# Patient Record
Sex: Male | Born: 1970 | Race: White | Hispanic: No | Marital: Married | Smoking: Never smoker
Health system: Southern US, Community
[De-identification: ages and names within clinical notes are randomized; demographics above are authoritative.]

## PROBLEM LIST (undated history)

## (undated) DIAGNOSIS — R569 Unspecified convulsions: Secondary | ICD-10-CM

## (undated) DIAGNOSIS — K219 Gastro-esophageal reflux disease without esophagitis: Secondary | ICD-10-CM

## (undated) DIAGNOSIS — E119 Type 2 diabetes mellitus without complications: Secondary | ICD-10-CM

## (undated) DIAGNOSIS — I8390 Asymptomatic varicose veins of unspecified lower extremity: Secondary | ICD-10-CM

## (undated) HISTORY — PX: VARICOSE VEIN SURGERY: SHX832

## (undated) HISTORY — PX: HERNIA REPAIR: SHX51

## (undated) HISTORY — DX: Asymptomatic varicose veins of unspecified lower extremity: I83.90

## (undated) HISTORY — PX: KNEE SURGERY: SHX244

---

## 2004-04-12 ENCOUNTER — Emergency Department (HOSPITAL_COMMUNITY): Admission: EM | Admit: 2004-04-12 | Discharge: 2004-04-12 | Payer: Self-pay | Admitting: Emergency Medicine

## 2009-10-28 ENCOUNTER — Ambulatory Visit (HOSPITAL_COMMUNITY): Admission: RE | Admit: 2009-10-28 | Discharge: 2009-10-28 | Payer: Self-pay | Admitting: Family Medicine

## 2009-10-28 ENCOUNTER — Ambulatory Visit: Payer: Self-pay | Admitting: Surgery

## 2009-10-28 ENCOUNTER — Encounter (INDEPENDENT_AMBULATORY_CARE_PROVIDER_SITE_OTHER): Payer: Self-pay | Admitting: Family Medicine

## 2009-11-19 ENCOUNTER — Ambulatory Visit: Payer: Self-pay | Admitting: Vascular Surgery

## 2009-11-20 ENCOUNTER — Emergency Department (HOSPITAL_BASED_OUTPATIENT_CLINIC_OR_DEPARTMENT_OTHER): Admission: EM | Admit: 2009-11-20 | Discharge: 2009-11-21 | Payer: Self-pay | Admitting: Emergency Medicine

## 2009-11-20 ENCOUNTER — Ambulatory Visit: Payer: Self-pay | Admitting: Diagnostic Radiology

## 2010-02-18 ENCOUNTER — Ambulatory Visit: Payer: Self-pay | Admitting: Vascular Surgery

## 2010-04-07 ENCOUNTER — Ambulatory Visit: Payer: Self-pay | Admitting: Vascular Surgery

## 2010-04-15 ENCOUNTER — Ambulatory Visit: Payer: Self-pay | Admitting: Vascular Surgery

## 2010-08-11 IMAGING — CT CT HEAD W/O CM
4 series · 18 of 30 positions shown, 19 images · non-contrast
Comparison: None available.

CLINICAL DATA: Syncope.

CT HEAD WITHOUT CONTRAST
TECHNIQUE: Contiguous axial images were obtained from the base of
the skull through the vertex without contrast.

[Series 2: head 4.8 h37s · axial · 0.48mm/px · z∈[+965,+1052]mm · 4 of 32 slices shown, 5 images (1 of 2)]
[im 7/32  brain]
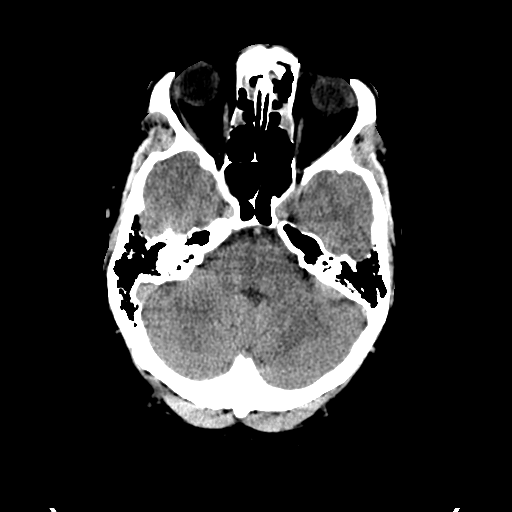
[im 7/32  bone]
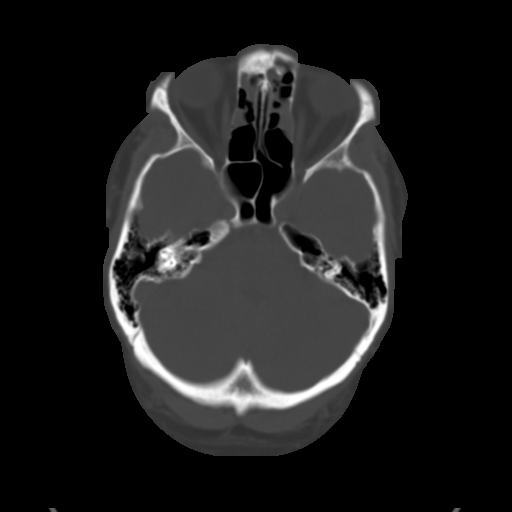
[im 13/32  brain]
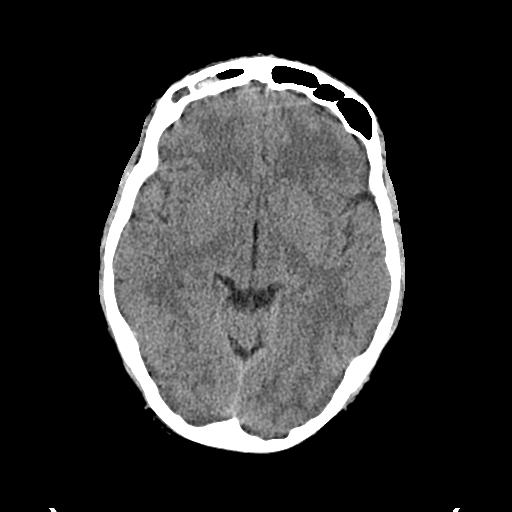
[im 19/32  brain]
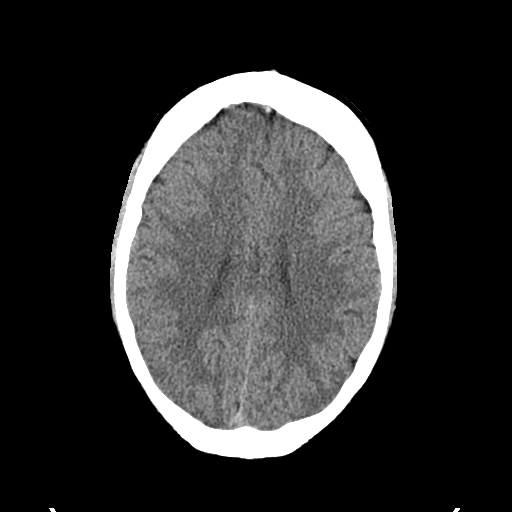
[im 25/32  brain]
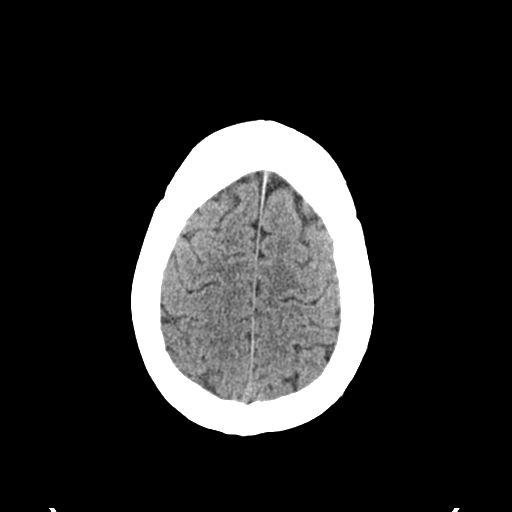

[Series 3: head 2.4 h60s bone · axial · 0.48mm/px · z∈[+947,+1073]mm · 8 of 64 slices shown (1 of 2)]
[im 6/64  bone]
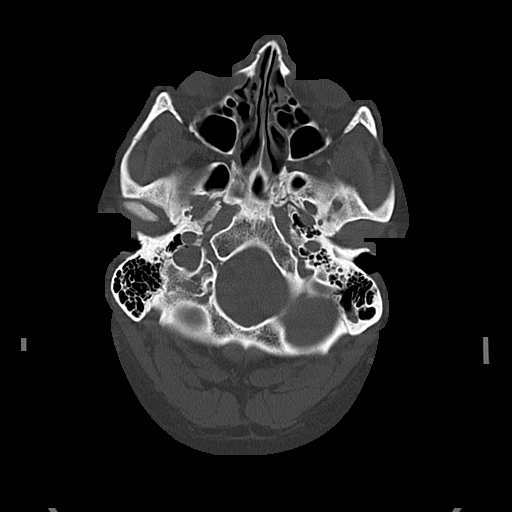
[im 12/64  bone]
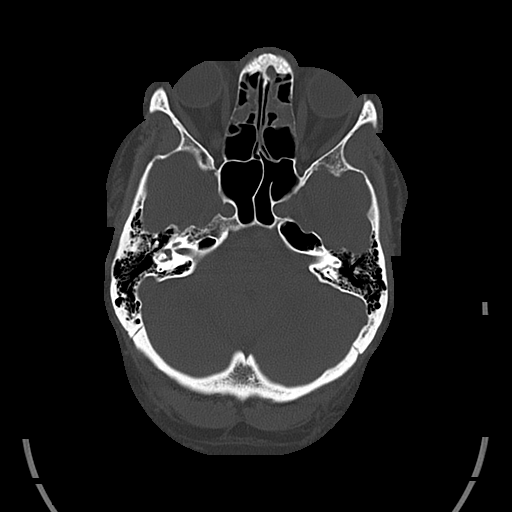
[im 23/64  bone]
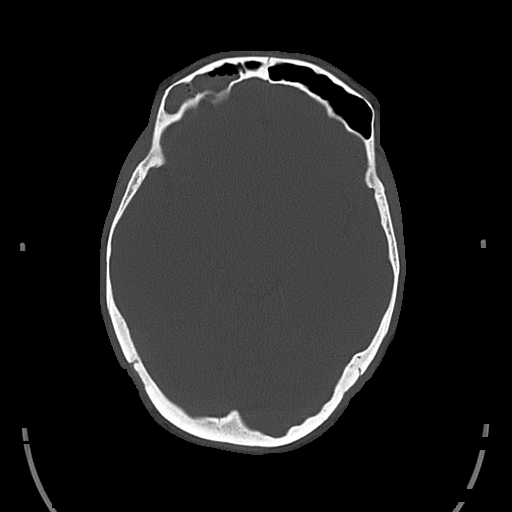
[im 29/64  bone]
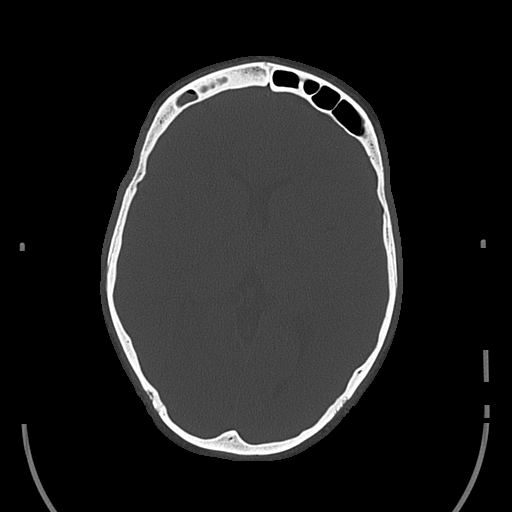
[im 35/64  bone]
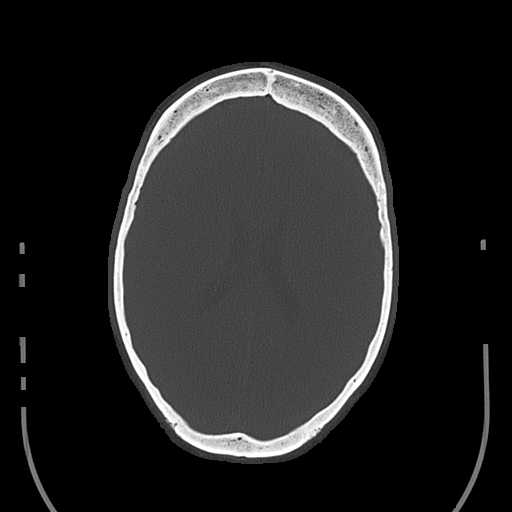
[im 41/64  bone]
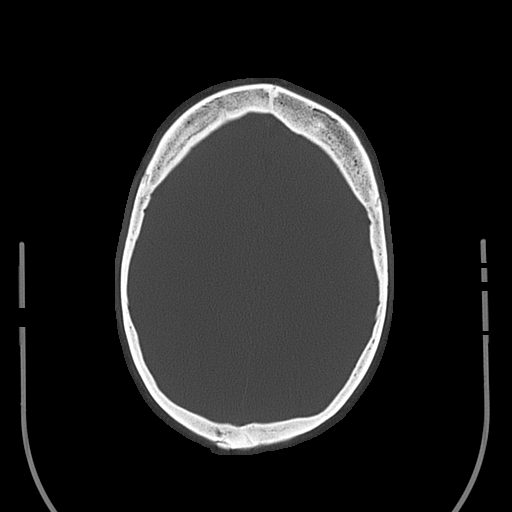
[im 52/64  bone]
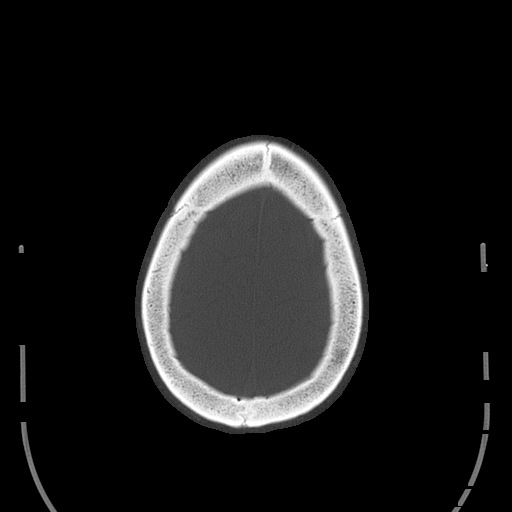
[im 58/64  bone]
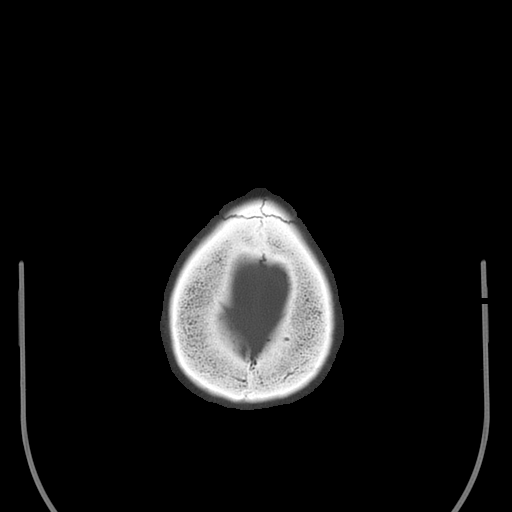

[Series 4: head 4.8 h37s · axial · 0.48mm/px · z∈[+965,+994]mm · 2 of 20 slices shown (2 of 2)]
[im 7/20  brain]
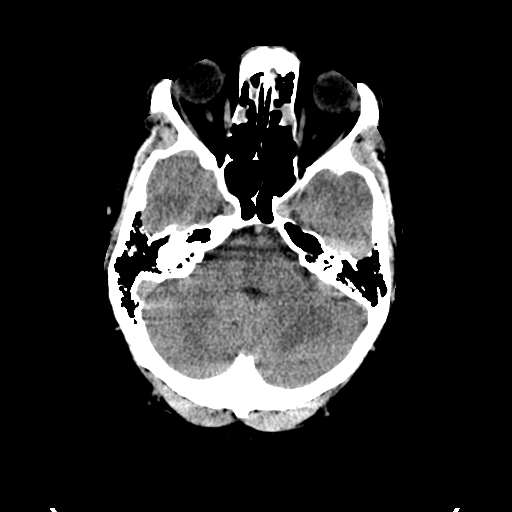
[im 13/20  brain]
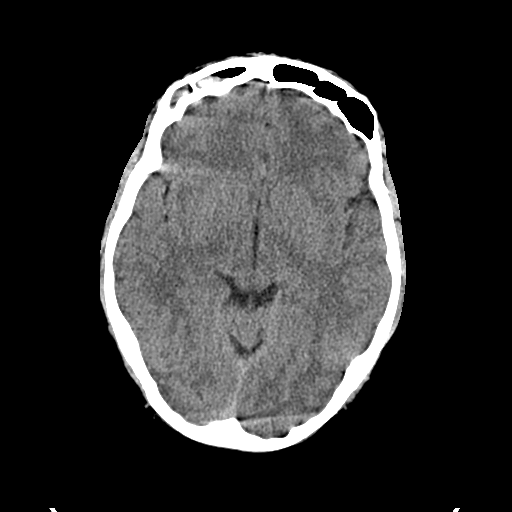

[Series 5: head 2.4 h60s bone · axial · 0.48mm/px · z∈[+947,+988]mm · 4 of 40 slices shown (2 of 2)]
[im 6/40  bone]
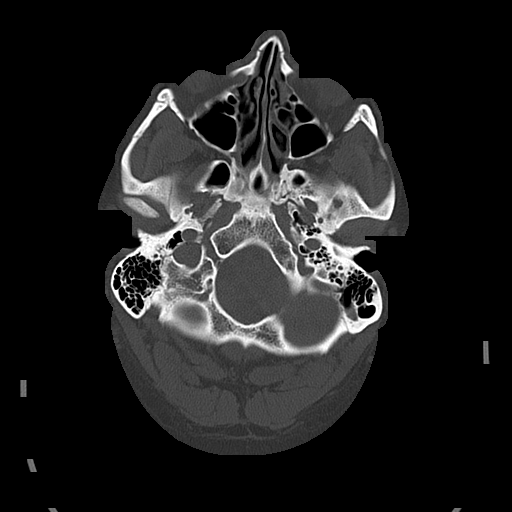
[im 12/40  bone]
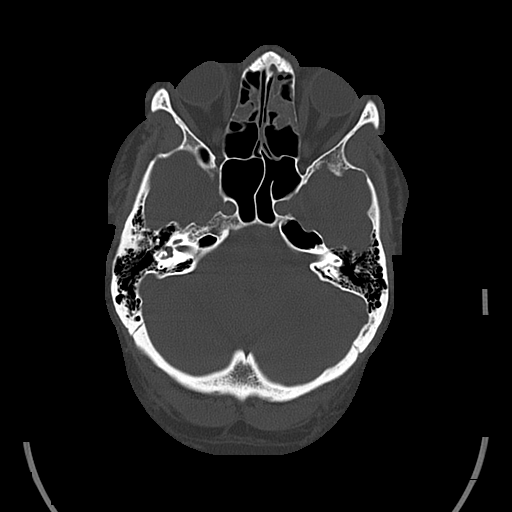
[im 17/40  bone]
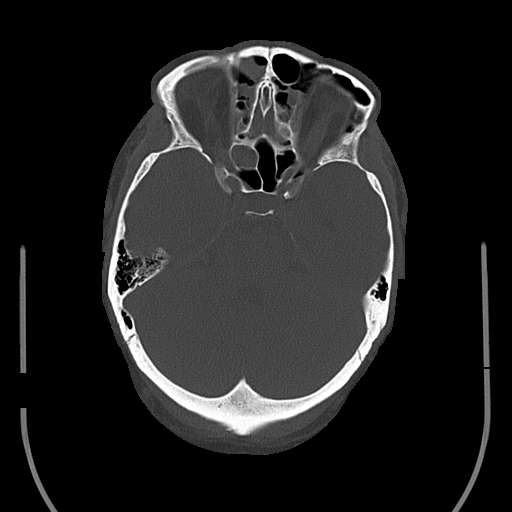
[im 23/40  bone]
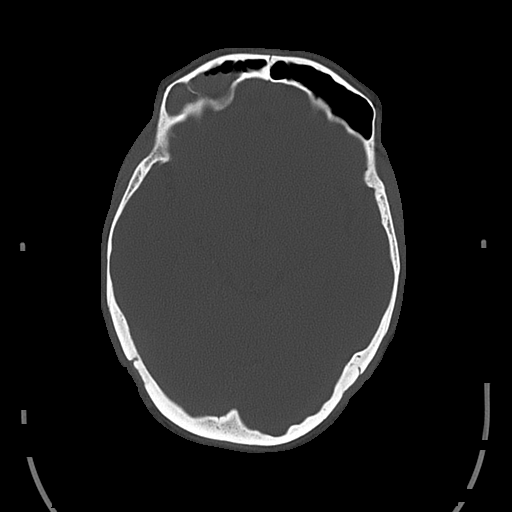

[18 of 30 positions shown; findings below may reference images not displayed]

FINDINGS: The brain appears normal evidence of acute infarction,
hemorrhage, mass lesion, mass effect, midline shift or abnormal
extra-axial fluid collection.  No hydrocephalus.  Cortical defect
is seen the anterior aspect of the frontal bone in the midline
consistent with incomplete fusion of the metopic suture.

The patient has fairly extensive sinus disease with bubbly
secretions seen in the right frontal sinus mucosal thickening left
frontal sinus.  Ethmoid air cell disease also noted.
IMPRESSION: 1.  No acute abnormality.
2.  Sinus disease which appears worst in the right frontal where
there are bubbly secretions compatible with acute disease.

## 2010-11-16 ENCOUNTER — Encounter: Payer: Self-pay | Admitting: Family Medicine

## 2011-01-12 LAB — CBC
Hemoglobin: 15 g/dL (ref 13.0–17.0)
MCHC: 34.7 g/dL (ref 30.0–36.0)
Platelets: 327 10*3/uL (ref 150–400)
RDW: 12.3 % (ref 11.5–15.5)

## 2011-01-12 LAB — BASIC METABOLIC PANEL
CO2: 28 mEq/L (ref 19–32)
Calcium: 8.7 mg/dL (ref 8.4–10.5)
Sodium: 138 mEq/L (ref 135–145)

## 2011-01-12 LAB — GLUCOSE, CAPILLARY: Glucose-Capillary: 194 mg/dL — ABNORMAL HIGH (ref 70–99)

## 2011-01-12 LAB — DIFFERENTIAL
Basophils Absolute: 0.2 10*3/uL — ABNORMAL HIGH (ref 0.0–0.1)
Lymphocytes Relative: 14 % (ref 12–46)
Lymphs Abs: 1.3 10*3/uL (ref 0.7–4.0)
Neutro Abs: 7 10*3/uL (ref 1.7–7.7)
Neutrophils Relative %: 78 % — ABNORMAL HIGH (ref 43–77)

## 2011-01-12 LAB — POCT CARDIAC MARKERS

## 2011-03-10 NOTE — Procedures (Signed)
LOWER EXTREMITY VENOUS REFLUX EXAM   INDICATION:  Right lower extremity varicose vein with pain and swelling.   EXAM:  Using color-flow imaging and pulse Doppler spectral analysis, the  right common femoral, superficial femoral, popliteal, posterior tibial,  greater and lesser saphenous veins are evaluated.  There is no evidence  suggesting deep venous insufficiency in the right lower extremity.   The right saphenofemoral junction is competent. The right GSV is not  competent with Reflux of >577milliseconds with the caliber as described  below.   The right proximal short saphenous vein demonstrates competency.   GSV Diameter (used if found to be incompetent only)                                            Right    Left  Proximal Greater Saphenous Vein           0.56 cm  cm  Proximal-to-mid-thigh                     0.56 cm  cm  Mid thigh                                 0.57 cm  cm  Mid-distal thigh                          0.57 cm  cm  Distal thigh                              0.57 cm  cm  Knee                                      0.52 cm  cm   IMPRESSION:  1. Right greater saphenous vein Reflux with >571milliseconds is      identified with the caliber ranging from 0.52 cm to 5.6 cm knee to      proximal thigh level.  2. The greater saphenous vein is not aneurysmal.  3. The right greater saphenous vein is not tortuous.  4. The deep venous system is competent.  5. The right lesser saphenous vein is competent.  6. No evidence of deep venous thrombosis noted in the right leg.  7. Thrombus noted in the varicose veins around the ankle area.   ___________________________________________  Quita Skye. Hart [place], M.D.   MG/MEDQ  D:  11/19/2009  T:  11/19/2009  Job:  [id number]

## 2011-03-10 NOTE — Procedures (Signed)
DUPLEX DEEP VENOUS EXAM - LOWER EXTREMITY   INDICATION:  One week varicose veins followup of right greater saphenous  vein.   HISTORY:  Edema:  yes  Trauma/Surgery:  Right greater saphenous vein ablation  Pain:  yes  PE:  no  Previous DVT:  No  Anticoagulants:  no  Other:   DUPLEX EXAM:                CFV   SFV   PopV  PTV    GSV                R  L  R  L  R  L  R   L  R  L  Thrombosis    0  0  0  0  0     +      +  Spontaneous   +  +  +     +     +      0  Phasic        +  +  +     +     +      0  Augmentation  +  +  +     +     +      0  Compressible  +  +  +     +     +      0  Competent     +  +  +     +     +      0   Legend:  + - yes  o - no  p - partial  D - decreased   IMPRESSION:  There does not appear to be any evidence of deep venous  thrombus noted in the right leg.  The right greater saphenous vein has  been abladed proximally to above the knee.  There is a patent lateral  branch off of the right saphenofemoral junction with no reflux noted.    _____________________________  Quita Skye Hart [place], M.D.   CB/MEDQ  D:  04/15/2010  T:  04/15/2010  Job:  [id number]

## 2011-03-10 NOTE — Assessment & Plan Note (Signed)
OFFICE VISIT   Joshua Conner, RIPP  DOB:  [birth year]                                       04/15/2010  ZOXWR#:[id number]   The patient returns 1 week post laser ablation of his right great  saphenous vein with greater than 20 stab phlebectomies for painful  varicosities secondary to venous reflux in the greater saphenous vein.  He has some mild to moderate discomfort in the proximal thigh, which is  resolving, and no change in the distal edema.  The stab phlebectomy  sites have not been painful.  He is wearing his stocking as instructed  and took the ibuprofen for 1 week.  Venous duplex exam today reveals  total closure of the right great saphenous vein with no reflux in the  anterolateral branch, which previously was noted to have reflux.  The  deep system has no DVT.  He is reassured regarding these findings.  He  will wear the stocking for 1 more week and then will return to see [place] on  a p.r.n. basis.     Quita Skye Hart Rochester, M.D.  Electronically Signed   JDL/MEDQ  D:  04/15/2010  T:  04/16/2010  Job:  [id number]

## 2011-03-10 NOTE — Consult Note (Signed)
NEW PATIENT CONSULTATION   Joshua Conner, Joshua Conner  DOB:  June 03, 1971                                       11/19/2009  ZOXWR#:[id number]   The patient is a healthy 40 year old male referred by Dr. Sigmund Hazel  for painful varicosities in the right leg and a recent episode of  thrombophlebitis.  The patient states that about 3 weeks ago he awoke  with pain and tenderness in the right pretibial area just above the  ankle with discoloration and tender to touch.  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  He has also noticed some  increased swelling in the right ankle as the day progressed over the  last few years.  He has had no history of bleeding, ulceration or other  complications.  A venous duplex exam was performed which revealed  superficial thrombophlebitis but no deep venous thrombosis.  He was  referred for further evaluation.  He has not worn elastic compression  stockings.  He occasionally elevates his legs with some improvement and  takes ibuprofen on a daily basis.   PREVIOUS CHRONIC MEDICAL PROBLEMS:  1. Hiatal hernia with previous esophageal dilatation.  2. 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  3. Status post umbilical hernia repair.  4. Negative for diabetes, hypertension, coronary artery disease, COPD      or stroke.   FAMILY HISTORY:  Positive for hypertension in a sister, stroke in a  grandfather.  Negative for diabetes or coronary artery disease.   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  He is on his feet all day.  He does not  use tobacco, drinks occasional alcohol.   REVIEW OF SYSTEMS:  Denies any chest pain, dyspnea on exertion, PND,  orthopnea.  Occasionally has swallowing problems as  noted with his  hiatal hernia.  No other abnormalities in the complete review of  systems.   REVIEW OF SYSTEMS:  Vital signs:  Blood pressure 143/93, heart rate 83,  respirations 20.  General:  He is a well-developed, well-nourished male  who is in no apparent distress.  He is alert and oriented x3.  HEENT:  Exam reveals EOMs intact, conjunctivae normal.  Lungs:  Clear to  auscultation.  Cardiovascular:  Regular rhythm.  No murmurs.  Abdomen:  Soft, nontender, no masses.  He has 3+ carotid pulses bilaterally with  no audible bruits.  Neurological:  Normal.  Musculoskeletal:  Exam  reveals no major deformities.  He has had previous knee surgery on the  right.  Skin:  Is free of rashes.  He does have hyperpigmentation the  lower third of the right leg.  Extremity exam:  Reveals 3+ femoral,  popliteal and dorsalis pedis pulses bilaterally.  There is bulging  varicosities in the right leg on the greater saphenous system along the  medial distal thigh and the medial calf with hyperpigmentation the lower  third of the leg and prominent spider and reticular veins throughout the  right ankle area.  No active ulcers noted.  He has some resolving  thrombophlebitis in the pretibial area on the right.  I reviewed the information provided by Dr. Hyacinth Meeker, organized this.  Ordered a repeat venous duplex exam today which I interpreted.  He does  have gross reflux throughout the entire right great saphenous vein and a  lateral branch feeding these painful varicosities.  No evidence of deep  venous obstruction with normal deep system.   The patient needs laser ablation of his right great saphenous vein with  multiple stab phlebectomies because of a complication of  thrombophlebitis and painful varicosities.  Will treat him for 3 months  with long leg elastic compression stockings (20 mm - 30 mm gradient) as  well as daily elevation as much as his job will allow and ibuprofen.  He  will return at that  time.  If there has been no improvement I think we  should proceed with the above named procedures.     Quita Skye Hart Rochester, M.D.  Electronically Signed   JDL/MEDQ  D:  11/19/2009  T:  11/20/2009  Job:  [id number]   cc:   Sigmund Hazel, M.D.

## 2011-03-10 NOTE — Assessment & Plan Note (Signed)
OFFICE VISIT   PHOENYX, PAULSEN  DOB:  09-22-1971                                       04/07/2010  YQIHK#:[id number]   The patient had laser ablation of his right great saphenous vein with  greater than 20 stab phlebectomies performed today for symptomatic  bulging varicosities in the right thigh, calf and ankle secondary to  saphenous reflux.  He tolerated the procedure well.  He will return in  June 21 for follow-up venous duplex exam to confirm closure.     Quita Skye Hart Rochester, M.D.  Electronically Signed   JDL/MEDQ  D:  04/07/2010  T:  04/08/2010  Job:  [id number]

## 2011-03-10 NOTE — Assessment & Plan Note (Signed)
OFFICE VISIT   [name], Joshua Conner  DOB:  1971-06-13                                       02/18/2010  [id number]#:[id number]   The patient returns today for further followup regarding his painful  varicosities secondary to venous hypertension in the right leg.  He has  bulging varicosities in the right distal thigh, medial calf and ankle  area and has had a history of thrombophlebitis in the pretibial area  secondary to these varicosities.  He continues to have swelling and pain  in the right leg despite wearing long leg elastic compression stockings  (20 mm - 30 mm gradient), elevating the leg as much as possible, and  trying ibuprofen on a daily basis.  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  His symptoms and swelling become worse as  the day progresses.  It is affecting his daily living as well as his  job.   On exam today he continues to have these bulging varicosities in the  distal thigh, medial calf and ankle and pretibial region with  hyperpigmentation lower third of the right leg and 1+ edema.  He has 3+  dorsalis pedis pulse palpable.   I think we should proceed with laser ablation of the right great  saphenous vein with multiple stab phlebectomies to be performed as a  single procedure.  We will proceed with precertification to perform them  in the near future.     Quita Skye Hart Rochester, M.D.  Electronically Signed   JDL/MEDQ  D:  02/18/2010  T:  02/19/2010  Job:  [id number]

## 2012-10-15 ENCOUNTER — Encounter (HOSPITAL_BASED_OUTPATIENT_CLINIC_OR_DEPARTMENT_OTHER): Payer: Self-pay | Admitting: *Deleted

## 2012-10-15 ENCOUNTER — Emergency Department (HOSPITAL_BASED_OUTPATIENT_CLINIC_OR_DEPARTMENT_OTHER)
Admission: EM | Admit: 2012-10-15 | Discharge: 2012-10-16 | Disposition: A | Discharge status: Home / Self Care | Payer: 59 | Attending: Emergency Medicine | Admitting: Emergency Medicine

## 2012-10-15 DIAGNOSIS — Y92838 Other recreation area as the place of occurrence of the external cause: Secondary | ICD-10-CM | POA: Insufficient documentation

## 2012-10-15 DIAGNOSIS — Y939 Activity, unspecified: Secondary | ICD-10-CM | POA: Insufficient documentation

## 2012-10-15 DIAGNOSIS — R569 Unspecified convulsions: Secondary | ICD-10-CM | POA: Insufficient documentation

## 2012-10-15 DIAGNOSIS — Z79899 Other long term (current) drug therapy: Secondary | ICD-10-CM | POA: Insufficient documentation

## 2012-10-15 DIAGNOSIS — T161XXA Foreign body in right ear, initial encounter: Secondary | ICD-10-CM

## 2012-10-15 DIAGNOSIS — T169XXA Foreign body in ear, unspecified ear, initial encounter: Secondary | ICD-10-CM | POA: Insufficient documentation

## 2012-10-15 DIAGNOSIS — Y9239 Other specified sports and athletic area as the place of occurrence of the external cause: Secondary | ICD-10-CM | POA: Insufficient documentation

## 2012-10-15 DIAGNOSIS — X58XXXA Exposure to other specified factors, initial encounter: Secondary | ICD-10-CM | POA: Insufficient documentation

## 2012-10-15 HISTORY — DX: Unspecified convulsions: R56.9

## 2012-10-15 MED ORDER — LIDOCAINE HCL 2 % IJ SOLN
INTRAMUSCULAR | Status: AC
  Filled 2012-10-15: qty 20

## 2012-10-15 NOTE — ED Provider Notes (Signed)
History   This chart was scribed for Richardean Canal, MD scribed by Magnus Sinning. The patient was seen in room MHT13/MHT13 at 22:20   CSN: [id number]  Arrival date & time 10/15/12  2207   First MD Initiated Contact with Patient 10/15/12 2219      Chief Complaint  Patient presents with  . Foreign Body in Ear    (Consider location/radiation/quality/duration/timing/severity/associated sxs/prior treatment) HPI Joshua Conner is a 41 y.o. male who presents to the Emergency Department complaining of a insect in his right ear, onset this evening. He says he was on a baseball field standing when he felt an insect fly and crawl into his ear. He says he was previously able to hear the buzzing, but notes he can now only feel it moving.  The patient notes that he takes Lamictal for treatment of seizures and nexium.  Past Medical History  Diagnosis Date  . Seizures     Past Surgical History  Procedure Date  . Hernia repair   . Knee surgery     History reviewed. No pertinent family history.  History  Substance Use Topics  . Smoking status: Never Smoker   . Smokeless tobacco: Not on file  . Alcohol Use: Yes     Review of Systems  All other systems reviewed and are negative.    Allergies  Review of patient's allergies indicates no known allergies.  Home Medications   Current Outpatient Rx  Name  Route  Sig  Dispense  Refill  . ESOMEPRAZOLE MAGNESIUM 40 MG PO CPDR   Oral   Take 40 mg by mouth daily before breakfast.         . LAMOTRIGINE 200 MG PO TABS   Oral   Take 200 mg by mouth daily.           BP 144/99  Pulse 92  Temp 97.6 F (36.4 C) (Oral)  Resp 23  SpO2 100%  Physical Exam  Nursing note and vitals reviewed. Constitutional: He is oriented to person, place, and time. He appears well-developed and well-nourished. No distress.  HENT:  Head: Normocephalic and atraumatic.  Right Ear: Tympanic membrane normal.  Left Ear: Tympanic membrane normal.   No bugs or foreign bodies visualized, but portion of the canal unable to be visualized. Right and left TM's are nml.  Eyes: Conjunctivae normal and EOM are normal.  Neck: Neck supple. No tracheal deviation present.  Cardiovascular: Normal rate.   Pulmonary/Chest: Effort normal. No respiratory distress.  Abdominal: He exhibits no distension.  Musculoskeletal: Normal range of motion.  Neurological: He is alert and oriented to person, place, and time. No sensory deficit.  Skin: Skin is dry.  Psychiatric: He has a normal mood and affect. His behavior is normal.    ED Course  Procedures (including critical care time) DIAGNOSTIC STUDIES: Oxygen Saturation is 100% on room air, normal by my interpretation.    I irrigated the R ear with 1% lidocaine. Patient remained on his side for 5 minutes with the lidocaine in the ear. No foreign body visualized. No complications.   COORDINATION OF CARE: 22:21: Physical exam performed. Labs Reviewed - No data to display No results found.   1. Foreign body of ear, right       MDM  Joshua Conner is a 41 y.o. male here with possible R foreign body in the ear that is not present currently. I think the bug may have gotten out. I recommend that he follows up with ENT  if he still feels the bug crawling.   I personally performed the services described in this documentation, which was scribed in my presence. The recorded information has been reviewed and is accurate.       Richardean Canal, MD 10/15/12 [phone]

## 2012-10-15 NOTE — ED Notes (Signed)
Pt states he has a bug in his right ear

## 2014-03-11 ENCOUNTER — Emergency Department (HOSPITAL_BASED_OUTPATIENT_CLINIC_OR_DEPARTMENT_OTHER): Payer: 59

## 2014-03-11 ENCOUNTER — Encounter (HOSPITAL_BASED_OUTPATIENT_CLINIC_OR_DEPARTMENT_OTHER): Payer: Self-pay | Admitting: Emergency Medicine

## 2014-03-11 ENCOUNTER — Emergency Department (HOSPITAL_BASED_OUTPATIENT_CLINIC_OR_DEPARTMENT_OTHER)
Admission: EM | Admit: 2014-03-11 | Discharge: 2014-03-12 | Disposition: A | Discharge status: Home / Self Care | Payer: 59 | Attending: Emergency Medicine | Admitting: Emergency Medicine

## 2014-03-11 DIAGNOSIS — J90 Pleural effusion, not elsewhere classified: Secondary | ICD-10-CM | POA: Insufficient documentation

## 2014-03-11 DIAGNOSIS — K219 Gastro-esophageal reflux disease without esophagitis: Secondary | ICD-10-CM | POA: Insufficient documentation

## 2014-03-11 DIAGNOSIS — G40909 Epilepsy, unspecified, not intractable, without status epilepticus: Secondary | ICD-10-CM | POA: Insufficient documentation

## 2014-03-11 DIAGNOSIS — R091 Pleurisy: Secondary | ICD-10-CM

## 2014-03-11 DIAGNOSIS — F411 Generalized anxiety disorder: Secondary | ICD-10-CM | POA: Insufficient documentation

## 2014-03-11 HISTORY — DX: Gastro-esophageal reflux disease without esophagitis: K21.9

## 2014-03-11 LAB — CBC
HCT: 40.8 % (ref 39.0–52.0)
Hemoglobin: 14.6 g/dL (ref 13.0–17.0)
MCH: 31.9 pg (ref 26.0–34.0)
MCHC: 35.8 g/dL (ref 30.0–36.0)
MCV: 89.1 fL (ref 78.0–100.0)
PLATELETS: 339 10*3/uL (ref 150–400)
RBC: 4.58 MIL/uL (ref 4.22–5.81)
RDW: 12.7 % (ref 11.5–15.5)
WBC: 8.7 10*3/uL (ref 4.0–10.5)

## 2014-03-11 LAB — BASIC METABOLIC PANEL
BUN: 22 mg/dL (ref 6–23)
CHLORIDE: 100 meq/L (ref 96–112)
CO2: 23 meq/L (ref 19–32)
Calcium: 9.1 mg/dL (ref 8.4–10.5)
Creatinine, Ser: 1.5 mg/dL — ABNORMAL HIGH (ref 0.50–1.35)
GFR, EST AFRICAN AMERICAN: 65 mL/min — AB (ref >90)
GFR, EST NON AFRICAN AMERICAN: 56 mL/min — AB (ref >90)
GLUCOSE: 104 mg/dL — AB (ref 70–99)
POTASSIUM: 3.8 meq/L (ref 3.7–5.3)
Sodium: 140 mEq/L (ref 137–147)

## 2014-03-11 LAB — TROPONIN I

## 2014-03-11 LAB — D-DIMER, QUANTITATIVE: D-Dimer, Quant: <0.27 ug/mL-FEU (ref 0.00–0.48)

## 2014-03-11 MED ORDER — ONDANSETRON HCL 4 MG/2ML IJ SOLN
4.0000 mg | Freq: Once | INTRAMUSCULAR | Status: AC
  Administered 2014-03-11: 4 mg via INTRAVENOUS
  Filled 2014-03-11: qty 2

## 2014-03-11 MED ORDER — LORAZEPAM 2 MG/ML IJ SOLN
1.0000 mg | Freq: Once | INTRAMUSCULAR | Status: AC
  Administered 2014-03-11: 1 mg via INTRAVENOUS

## 2014-03-11 MED ORDER — LORAZEPAM 2 MG/ML IJ SOLN
INTRAMUSCULAR | Status: AC
  Filled 2014-03-11: qty 1

## 2014-03-11 MED ORDER — SODIUM CHLORIDE 0.9 % IV BOLUS (SEPSIS)
1000.0000 mL | Freq: Once | INTRAVENOUS | Status: AC
  Administered 2014-03-12: 1000 mL via INTRAVENOUS

## 2014-03-11 NOTE — ED Notes (Addendum)
Pt girlfriend states they were out for an hour long walk, then had a personal conversation. Pt has been working with someone for anxiety but no medication given. Pt states that 30 min ago his chest starting hurting. Pt states center chest no radiation with an increase when he takes a deep breath. Pt states that pressure on the site does increase pain at first but then relieves pain.

## 2014-03-11 NOTE — ED Provider Notes (Signed)
CSN: [id number]     Arrival date & time 03/11/14  2243 History  This chart was scribed for Joya Gaskinsonald W Pius Byrom, MD by Beverly MilchJ Harrison Collins, ED Scribe. This patient was seen in room MH11/MH11 and the patient's care was started at 11:12 PM.    Chief Complaint  Patient presents with  . Chest Pain  . Shortness of Breath    Patient is a 43 y.o. male presenting with chest pain and shortness of breath. The history is provided by the patient. No language interpreter was used.  Chest Pain Pain location:  L chest Pain quality: burning and stabbing   Pain radiates to:  Upper back Pain radiates to the back: yes   Pain severity:  Moderate Onset quality:  Sudden Duration:  1 hour Timing:  Constant Progression:  Unchanged Chronicity:  New Context: breathing   Associated symptoms: shortness of breath   Associated symptoms: no fever   Shortness of Breath Associated symptoms: chest pain   Associated symptoms: no fever      HPI Comments: Melina ModenaScott Opara is a 43 y.o. male who presents to the Emergency Department complaining of CP that began 30 minutes ago. He states he had been walking the dog leisurely when he suddenly felt the pain. Pt reports it hurts when he breathes. Pt denies h/o similar pain and DM. Pt denies smoking.  This started immediately after having a "personal conversation" with family.  He immediately had chest pain and was hyperventilating per family member  He has no h/o CAD/DVT/PE  Past Medical History  Diagnosis Date  . Seizures   . Acid reflux     Past Surgical History  Procedure Laterality Date  . Hernia repair    . Knee surgery      History reviewed. No pertinent family history. History  Substance Use Topics  . Smoking status: Never Smoker   . Smokeless tobacco: Not on file  . Alcohol Use: Yes    Review of Systems  Constitutional: Negative for fever.  Respiratory: Positive for shortness of breath.   Cardiovascular: Positive for chest pain.  Neurological:  Negative for syncope.  Psychiatric/Behavioral: The patient is nervous/anxious.   All other systems reviewed and are negative.     Allergies  Review of patient's allergies indicates no known allergies.  Home Medications   Prior to Admission medications   Medication Sig Start Date End Date Taking? Authorizing Provider  esomeprazole (NEXIUM) 40 MG capsule Take 40 mg by mouth daily before breakfast.   Yes Historical Provider, MD  lamoTRIgine (LAMICTAL) 200 MG tablet Take 200 mg by mouth daily.   Yes Historical Provider, MD    Triage Vitals: BP 144/84  Pulse 106  Temp(Src) 98.6 F (37 C) (Oral)  Resp 16  Ht 6\' 1"  (1.854 m)  Wt 245 lb (111.131 kg)  BMI 32.33 kg/m2  SpO2 100%   Physical Exam  Nursing note and vitals reviewed. CONSTITUTIONAL: Well developed/well nourished, anxious HEAD: Normocephalic/atraumatic EYES: EOMI/PERRL ENMT: Mucous membranes moist NECK: supple no meningeal signs SPINE:entire spine nontender CV: S1/S2 noted, no murmurs/rubs/gallops noted LUNGS: decreased breath sounds bilaterally due to poor effort ABDOMEN: soft, nontender, no rebound or guarding GU:no cva tenderness NEURO: Pt is awake/alert, moves all extremitiesx4, no focal weakness noted in any of his extremities EXTREMITIES: pulses normal, full ROM, no calf tenderness or edema SKIN: warm, color normal PSYCH: anxious, clutching his chest   ED Course  Procedures      EMERGENCY DEPARTMENT US CARDIAC EXAM "Study: Limited Ultrasound  of the heart and pericardium"  INDICATIONS:Dyspnea views of the heart and pericardium are obtained with a multi-frequency probe.  PERFORMED RU:EAVWUJBY:Myself  IMAGES ARCHIVED?: Yes  FINDINGS: No pericardial effusion  LIMITATIONS:  Body habitus  VIEWS USED: Parasternal long axis  INTERPRETATION: Pericardial effusioin absent   DIAGNOSTIC STUDIES: Oxygen Saturation is 100% on RA, normal by my interpretation.     COORDINATION OF CARE: 11:16 PM- Pt advised of  plan for treatment and pt agrees. Pt improved Pt has low HEART score.   Will obtain repeat ekg/troponin D-dimer negative, doubt PE or Dissection at this time   3:50 AM Pt monitored for several hours His EKG was unchanged Repeat troponin negative after 3 hrs D-dimer negative Possible pericarditis (no EKG changes) but did endorse sharp CP.  Bedside ultrasound did not reveal pericardial effusion  Pt had onset of pain immediately after stressful conversation and had CP and also hyperventilating. I feel part of this may be caused by anxiety, however pericarditis is possible.  He was started on NSAIDs.  Will also give short course of ativan for anxiety.  I advised need to see his PCP this week We discussed strict return precautions   Labs Review Labs Reviewed  BASIC METABOLIC PANEL - Abnormal; Notable for the following:    Glucose, Bld 104 (*)    Creatinine, Ser 1.50 (*)    GFR calc non Af Amer 56 (*)    GFR calc Af Amer 65 (*)    All other components within normal limits  CBC  TROPONIN I  D-DIMER, QUANTITATIVE  TROPONIN I  TROPONIN I    Imaging Review Dg Chest Port 1 View  03/11/2014   CLINICAL DATA:  CHEST PAIN SHORTNESS OF BREATH  EXAM: PORTABLE CHEST - 1 VIEW  COMPARISON:  None.  FINDINGS: Low lung volumes. The heart size and mediastinal contours are within normal limits. Both lungs are clear. The visualized skeletal structures are unremarkable.  IMPRESSION: No active disease.   Electronically Signed   By: Salome HolmesHector  Cooper M.D.   On: 03/11/2014 23:38      EKG Interpretation   Date/Time:  Sunday Mar 11 2014 22:51:28 EDT Ventricular Rate:  96 PR Interval:  162 QRS Duration: 110 QT Interval:  358 QTC Calculation: 452 R Axis:   -10 Text Interpretation:  Normal sinus rhythm Normal ECG Confirmed by Bebe ShaggyWICKLINE   MD, Dorinda HillNALD ([id number]) on 03/11/2014 11:01:46 PM      MDM   Final diagnoses:  Pleurisy    Nursing notes including past medical history and social history reviewed  and considered in documentation xrays reviewed and considered Labs/vital reviewed and considered   I personally performed the services described in this documentation, which was scribed in my presence. The recorded information has been reviewed and is accurate.      Joya Gaskinsonald W Kaedon Fanelli, MD 03/12/14 [phone]

## 2014-03-11 NOTE — ED Notes (Signed)
Wife reports 4 beers earlier

## 2014-03-12 ENCOUNTER — Other Ambulatory Visit: Payer: Self-pay

## 2014-03-12 LAB — TROPONIN I: Troponin I: <0.3 ng/mL (ref <0.30)

## 2014-03-12 MED ORDER — MORPHINE SULFATE 4 MG/ML IJ SOLN
4.0000 mg | Freq: Once | INTRAMUSCULAR | Status: AC
  Administered 2014-03-12: 4 mg via INTRAVENOUS
  Filled 2014-03-12: qty 1

## 2014-03-12 MED ORDER — IBUPROFEN 800 MG PO TABS
800.0000 mg | ORAL_TABLET | Freq: Once | ORAL | Status: AC
  Administered 2014-03-12: 800 mg via ORAL
  Filled 2014-03-12: qty 1

## 2014-03-12 MED ORDER — IBUPROFEN 600 MG PO TABS
600.0000 mg | ORAL_TABLET | Freq: Four times a day (QID) | ORAL | Status: DC | PRN
Start: 2014-03-12 — End: 2024-02-14

## 2014-03-12 MED ORDER — LORAZEPAM 1 MG PO TABS: 1.0000 mg | ORAL_TABLET | Freq: Two times a day (BID) | ORAL | Status: DC

## 2014-03-12 MED ORDER — LORAZEPAM 2 MG/ML IJ SOLN
1.0000 mg | Freq: Once | INTRAMUSCULAR | Status: AC
  Administered 2014-03-12: 1 mg via INTRAVENOUS
  Filled 2014-03-12: qty 1

## 2014-03-12 NOTE — ED Notes (Signed)
As I entered room,patient was still and almost asleep, I announced to him, that I needed to take a new set of vitals. I started BP cuff to inflate, patient aroused awake, then started moaning to show pain. As BP cuff tightened patient was louder and tossing back and forth. First BP of 138/120 was in question. After I finished vitals, patient went back to calm dimeaner. I hit BP cuff again to see if BP was lower while patient was not aware. As cuff became tighter, patient again acted out as if in pain. And BP showed 167/99 lower.

## 2014-03-12 NOTE — Discharge Instructions (Signed)
Pleurisy °Pleurisy is redness, puffiness (swelling), and soreness (inflammation) of the lining of the lungs. It can be hard to breathe and hurt to breathe. Coughing or deep breathing will make it hurt more. It is often caused by an existing infection or disease.  °HOME CARE °· Only take medicine as told by your doctor. °· Only take antibiotic medicine as directed. Make sure to finish it even if you start to feel better. °GET HELP RIGHT AWAY IF:  °· Your lips, fingernails, or toenails are blue or dark. °· You cough up blood. °· You have a hard time breathing. °· Your pain is not controlled with medicine or it lasts for more than 1 week. °· Your pain spreads (radiates) into your neck, arms, or jaw. °· You are short of breath or wheezing. °· You develop a fever, rash, throw up (vomit), or faint. °MAKE SURE YOU:  °· Understand these instructions. °· Will watch your condition. °· Will get help right away if you are not doing well or get worse. °Document Released: 09/24/2008 Document Revised: 06/14/2013 Document Reviewed: 03/26/2013 °[employer]® Patient Information ©2014 [employer]. ° °

## 2014-04-13 ENCOUNTER — Telehealth: Payer: Self-pay

## 2014-04-13 DIAGNOSIS — I83893 Varicose veins of bilateral lower extremities with other complications: Secondary | ICD-10-CM

## 2014-04-13 NOTE — Telephone Encounter (Signed)
Phone call from pt. Reported a bulging vein in right inner thigh, approx. 1/2 way between the knee and the groin.  Stated the area is warm and tender; noticed it 2 days ago.  Denied any redness.  Reported he usually has some degree of swelling in the right lower leg from foot to knee, due to a "hx of a lot of trauma with previous surgeries and fractures"; denied any increase in the swelling.  Also reports he has several varicose veins in the right lower leg.  Denied any problem with the left leg.  Advised to wear compression stocking on right leg, elevate, and apply ice to the tender vein. (pt. stated he is unable to take Ibuprofen, due to kidney disease.) appt. given for 05/01/14 @ 12:30 PM.  Question if safe to fly?  Advised if his flight is short, and if he wears compression stockings, he will be safe to fly.  Stated [place] to [place]; approx. 1 1/2 hr in length.  Advised to report worsening symptoms, if occurs prior to appt. 05/01/14. Verb. Understanding.

## 2014-04-30 ENCOUNTER — Encounter: Payer: Self-pay | Admitting: Vascular Surgery

## 2014-05-01 ENCOUNTER — Encounter: Payer: Self-pay | Admitting: Vascular Surgery

## 2014-05-01 ENCOUNTER — Ambulatory Visit (INDEPENDENT_AMBULATORY_CARE_PROVIDER_SITE_OTHER): Payer: 59 | Admitting: Vascular Surgery

## 2014-05-01 ENCOUNTER — Ambulatory Visit (HOSPITAL_COMMUNITY)
Admission: RE | Admit: 2014-05-01 | Discharge: 2014-05-01 | Disposition: A | Discharge status: Home / Self Care | Payer: 59 | Source: Ambulatory Visit | Attending: Vascular Surgery | Admitting: Vascular Surgery

## 2014-05-01 VITALS — BP 130/80 | HR 88 | Resp 16 | Ht 72.0 in | Wt 245.0 lb

## 2014-05-01 DIAGNOSIS — I83893 Varicose veins of bilateral lower extremities with other complications: Secondary | ICD-10-CM | POA: Insufficient documentation

## 2014-05-01 NOTE — Progress Notes (Signed)
Subjective:     Patient ID: Joshua Conner, male   DOB: Mar 11, 1971, 43 y.o.   MRN: [id number]  HPI this 43 year old male had previous laser ablation right great saphenous vein with multiple stab phlebectomy performed in 20 oh 11. He had a good early result. Over the last few years he has developed large painful varicosities in the thigh but most severely in the medial calf and ankle area. He also had an episode of acute thrombophlebitis in the right medial thigh a few weeks ago. He began his conservative management of this using long-leg elastic compression stockings as well as elevation ibuprofen as you've done in the past. He initiated this on June 19 of 2015. Today he is in for reevaluation. He has no history of DVT or any symptoms in the contralateral left leg.  Past Medical History  Diagnosis Date  . Seizures   . Acid reflux     History  Substance Use Topics  . Smoking status: Never Smoker   . Smokeless tobacco: Not on file  . Alcohol Use: Yes    No family history on file.  No Known Allergies  Current outpatient prescriptions:esomeprazole (NEXIUM) 40 MG capsule, Take 40 mg by mouth daily before breakfast., Disp: , Rfl: ;  ibuprofen (ADVIL,MOTRIN) 600 MG tablet, Take 1 tablet (600 mg total) by mouth every 6 (six) hours as needed., Disp: 21 tablet, Rfl: 0;  lamoTRIgine (LAMICTAL) 200 MG tablet, Take 200 mg by mouth daily., Disp: , Rfl:  LORazepam (ATIVAN) 1 MG tablet, Take 1 tablet (1 mg total) by mouth 2 (two) times daily., Disp: 10 tablet, Rfl: 0  BP 130/80  Pulse 88  Resp 16  Ht 6' (1.829 m)  Wt 245 lb (111.131 kg)  BMI 33.22 kg/m2  Body mass index is 33.22 kg/(m^2).           Review of Systems denies chest pain, dyspnea on exertion, PND, orthopnea, hemoptysis, does complain of mild claudication symptoms in distal edema particularly in the right ankle. All other systems negative and a complete review of systems     Objective:   Physical Exam BP 130/80  Pulse 88   Resp 16  Ht 6' (1.829 m)  Wt 245 lb (111.131 kg)  BMI 33.22 kg/m2  General well-developed well-nourished male no apparent stress alert and oriented x3 Lungs no rhonchi or wheezing Right leg with bulging varicosities beginning in the medial thigh posteriorly and extending into the medial calf and ankle area with 1+ edema. Early hyperpigmentation. No active ulceration noted. 3+ dorsalis pedis pulse palpable.  Today I ordered venous duplex exam of the right leg which I reviewed and interpreted. There is no DVT. There is closure of the proximal right great saphenous vein from the previous ablation being closed from the mid thigh to near the saphenofemoral junction. Anterior accessory branch is patent and has reflux. There is however a large great saphenous vein from the mid calf to the mid thigh which has gross reflux applying these bulging varicosities. I confirmed these findings with the bedside sono site ultrasound exam.      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 Vein is large with gross reflux. Patient also has reflux and anterior accessory branch right great saphenous vein    Plan:         #1 long leg elastic compression stockings 20-30 mm gradient #2 elevate legs as much as  possible #3 ibuprofen daily on a regular basis for pain #4 return in 3 months-if no significant improvement then patient will need laser ablation right great saphenous vein from mid calf to mid thigh plus greater than 20 stab phlebectomy of painful varicosities. He began conservative management on 04/13/2014 when he began wearing his long-leg elastic compression stockings and trying elevation ibuprofen so he will return in 3 months from that date

## 2014-07-16 ENCOUNTER — Encounter: Payer: Self-pay | Admitting: Vascular Surgery

## 2014-07-17 ENCOUNTER — Ambulatory Visit: Payer: 59 | Admitting: Vascular Surgery

## 2014-08-06 ENCOUNTER — Encounter: Payer: Self-pay | Admitting: Vascular Surgery

## 2014-08-07 ENCOUNTER — Ambulatory Visit (INDEPENDENT_AMBULATORY_CARE_PROVIDER_SITE_OTHER): Payer: 59 | Admitting: Vascular Surgery

## 2014-08-07 ENCOUNTER — Encounter: Payer: Self-pay | Admitting: Vascular Surgery

## 2014-08-07 VITALS — BP 136/88 | HR 83 | Ht 72.0 in | Wt 270.0 lb

## 2014-08-07 DIAGNOSIS — I83899 Varicose veins of unspecified lower extremities with other complications: Secondary | ICD-10-CM | POA: Insufficient documentation

## 2014-08-07 DIAGNOSIS — I83891 Varicose veins of right lower extremities with other complications: Secondary | ICD-10-CM

## 2014-08-07 NOTE — Progress Notes (Signed)
Subjective:     Patient ID: Joshua ModenaScott Conner, male   DOB: 13-Mar-1971, 43 y.o.   MRN: [id number]  HPI this 43 year old male returns for continued followup regarding his painful varicosities in the right thigh and calf area. He has tried along with elastic compression stockings 20-30 mm gradient as well as elevation and ibuprofen but continues to have aching burning and throbbing discomfort which worsens as the day progresses. He had a previous laser ablation of the proximal right great saphenous vein in 2011 and this segment remains closed. He has a large great saphenous vein from the mid thigh to the mid calf supplying the painful varicosities and also has an anterior accessory branch with gross reflux supplying some other varicosities. His symptoms are affecting his daily living.  Past Medical History  Diagnosis Date  . Seizures   . Acid reflux     History  Substance Use Topics  . Smoking status: Never Smoker   . Smokeless tobacco: Not on file  . Alcohol Use: Yes    No family history on file.  No Known Allergies  Current outpatient prescriptions:esomeprazole (NEXIUM) 40 MG capsule, Take 40 mg by mouth daily before breakfast., Disp: , Rfl: ;  lamoTRIgine (LAMICTAL) 200 MG tablet, Take 200 mg by mouth daily., Disp: , Rfl: ;  ibuprofen (ADVIL,MOTRIN) 600 MG tablet, Take 1 tablet (600 mg total) by mouth every 6 (six) hours as needed., Disp: 21 tablet, Rfl: 0 LORazepam (ATIVAN) 1 MG tablet, Take 1 tablet (1 mg total) by mouth 2 (two) times daily., Disp: 10 tablet, Rfl: 0  BP 136/88  Pulse 83  Ht 6' (1.829 m)  Wt 270 lb (122.471 kg)  BMI 36.61 kg/m2  SpO2 100%  Body mass index is 36.61 kg/(m^2).           Review of Systems denies chest pain, dyspnea on exertion, PND, orthopnea, hemoptysis, claudication.    Objective:   Physical Exam BP 136/88  Pulse 83  Ht 6' (1.829 m)  Wt 270 lb (122.471 kg)  BMI 36.61 kg/m2  SpO2 100%  General well-developed well-nourished male no  apparent stress alert and oriented x3 Right leg with large bulging varicosities in the anterior thigh and distal medial thigh over great saphenous vein and also in the medial calf below the knee extending down to the ankle with early hyperpigmentation but no active ulceration. 3+ dorsalis pedis pulse palpable  Today I reviewed the previous venous duplex exam which revealed gross reflux in the great saphenous vein and anterior accessory branch of the great saphenous vein supplying these bulging varicosities      Assessment:     Painful varicosities right leg-recurrent 2 to gross reflux right great saphenous vein and anterior accessory branch right great saphenous vein    Plan:     Initially patient needs laser ablation right great saphenous vein with greater than 20 stab phlebectomy of painful varicosities. He will then need to return in 3 months for repeat study to see if the anterior accessory branch will need to be treated as well. We will proceed with preserved attention to perform this in the near future to relieve his symptoms which are affecting his daily living

## 2014-08-10 ENCOUNTER — Other Ambulatory Visit: Payer: Self-pay | Admitting: *Deleted

## 2014-08-10 DIAGNOSIS — I83891 Varicose veins of right lower extremities with other complications: Secondary | ICD-10-CM

## 2014-08-17 ENCOUNTER — Encounter: Payer: Self-pay | Admitting: Vascular Surgery

## 2014-08-20 ENCOUNTER — Ambulatory Visit (INDEPENDENT_AMBULATORY_CARE_PROVIDER_SITE_OTHER): Payer: 59 | Admitting: Vascular Surgery

## 2014-08-20 ENCOUNTER — Encounter: Payer: Self-pay | Admitting: Vascular Surgery

## 2014-08-20 VITALS — BP 149/93 | HR 87 | Temp 97.7°F | Resp 20 | Ht 72.0 in | Wt 270.0 lb

## 2014-08-20 DIAGNOSIS — I83891 Varicose veins of right lower extremities with other complications: Secondary | ICD-10-CM

## 2014-08-20 NOTE — Progress Notes (Signed)
Subjective:     Patient ID: Joshua Conner, male   DOB: 06/03/1971, 43 y.o.   MRN: [id number]  HPI this 43 year old male had laser ablation of the right great saphenous vein plus greater than 20 stab phlebectomy of painful varicosities performed under local tumescent anesthesia. A total of 1227 J of energy was utilized. He tolerated the procedure well.   Review of Systems     Objective:   Physical Exam BP 149/93  Pulse 87  Temp(Src) 97.7 F (36.5 C)  Resp 20  Ht 6' (1.829 m)  Wt 270 lb (122.471 kg)  BMI 36.61 kg/m2        Assessment:     Well-tolerated laser ablation right great saphenous vein plus greater than 20 stab phlebectomy of painful varicosities performed under local tumescent anesthesia    Plan:     Return in 1 week for venous duplex exam to confirm closure right great saphenous vein

## 2014-08-21 ENCOUNTER — Telehealth: Payer: Self-pay | Admitting: *Deleted

## 2014-08-21 NOTE — Telephone Encounter (Signed)
Pt doing well. Some pain but following all instructions. No bleeding from stab sites. Will see him next week.

## 2014-08-21 NOTE — Progress Notes (Signed)
   Laser Ablation Procedure      Date: 08/21/2014    Melina ModenaScott Cassaday DOB:[birth year]  Consent signed: Yes  Surgeon:J.D. Hart RochesterLawson  Procedure: Laser Ablation: right Greater Saphenous Vein  BP 149/93  Pulse 87  Temp(Src) 97.7 F (36.5 C)  Resp 20  Ht 6' (1.829 m)  Wt 270 lb (122.471 kg)  BMI 36.61 kg/m2  Start time: 11:05   End time: 12:45  Tumescent Anesthesia: 325 cc 0.9% NaCl with 50 cc Lidocaine HCL with 1% Epi and 15 cc 8.4% NaHCO3  Local Anesthesia: 8 cc Lidocaine HCL and NaHCO3 (ratio 2:1) Pulsed mode: 15 watts, 500 ms delay, 1.0 duration Total energy: 1227, total time: 1:21     Stab Phlebectomy: >20 Sites: Calf  Patient tolerated procedure well: Yes  Notes:   Description of Procedure:  After marking the course of the secondary varicosities, the patient was placed on the operating table in the supine position, and the right leg was prepped and draped in sterile fashion.   Local anesthetic was administered and under ultrasound guidance the saphenous vein was accessed with a micro needle and guide wire; then the mirco puncture sheath was place.  A guide wire was inserted saphenofemoral junction , followed by a 5 french sheath.  The position of the sheath and then the laser fiber below the junction was confirmed using the ultrasound.  Tumescent anesthesia was administered along the course of the saphenous vein using ultrasound guidance. The patient was placed in Trendelenburg position and protective laser glasses were placed on patient and staff, and the laser was fired at 15 watt pulsed mode advancing 1-2 mm per sec for a total of 1227 joules.   For stab phlebectomies, local anesthetic was administered at the previously marked varicosities, and tumescent anesthesia was administered around the vessels.  Greater than 20 stab wounds were made using the tip of an 11 blade. And using the vein hook, the phlebectomies were performed using a hemostat to avulse the varicosities.   Adequate hemostasis was achieved.     Steri strips were applied to the stab wounds and ABD pads and thigh high compression stockings were applied.  Ace wrap bandages were applied over the phlebectomy sites and at the top of the saphenofemoral junction. Blood loss was less than 15 cc.  The patient ambulated out of the operating room having tolerated the procedure well.

## 2014-08-23 ENCOUNTER — Encounter: Payer: Self-pay | Admitting: Vascular Surgery

## 2014-08-24 ENCOUNTER — Encounter: Payer: Self-pay | Admitting: Vascular Surgery

## 2014-08-27 ENCOUNTER — Encounter (HOSPITAL_COMMUNITY): Payer: 59

## 2014-08-27 ENCOUNTER — Ambulatory Visit: Payer: 59 | Admitting: Vascular Surgery

## 2014-08-27 ENCOUNTER — Encounter: Payer: Self-pay | Admitting: Vascular Surgery

## 2014-08-28 ENCOUNTER — Ambulatory Visit (INDEPENDENT_AMBULATORY_CARE_PROVIDER_SITE_OTHER): Payer: Self-pay | Admitting: Vascular Surgery

## 2014-08-28 ENCOUNTER — Encounter: Payer: Self-pay | Admitting: Vascular Surgery

## 2014-08-28 ENCOUNTER — Ambulatory Visit: Payer: 59 | Admitting: Vascular Surgery

## 2014-08-28 ENCOUNTER — Encounter (HOSPITAL_COMMUNITY): Payer: 59

## 2014-08-28 ENCOUNTER — Ambulatory Visit (HOSPITAL_COMMUNITY)
Admission: RE | Admit: 2014-08-28 | Discharge: 2014-08-28 | Disposition: A | Discharge status: Home / Self Care | Payer: 59 | Source: Ambulatory Visit | Attending: Vascular Surgery | Admitting: Vascular Surgery

## 2014-08-28 VITALS — BP 130/88 | HR 86 | Temp 97.8°F | Resp 16 | Ht 72.0 in | Wt 260.0 lb

## 2014-08-28 DIAGNOSIS — I83891 Varicose veins of right lower extremities with other complications: Secondary | ICD-10-CM

## 2014-08-28 DIAGNOSIS — Z48812 Encounter for surgical aftercare following surgery on the circulatory system: Secondary | ICD-10-CM

## 2014-08-28 NOTE — Progress Notes (Signed)
Subjective:     Patient ID: Joshua Conner, male   DOB: [birth year], 43 y.o.   MRN: [id number]  HPIthis 43 year old male returns 1 week post laser ablation right great saphenous vein with greater than 20 stab phlebectomy of painful varicosities. He had some mild-to-moderate discomfort along the course of the great saphenous vein in the mid to proximal thigh. That has now resolved. He did wear his elastic compression stocking as instructed and took ibuprofen and elevated the leg. He's had no pain in the stab phlebectomy sites and no distal edema. He denies any chest pain or shortness of breath.   Review of Systems     Objective:   Physical Exam BP 130/88 mmHg  Pulse 86  Temp(Src) 97.8 F (36.6 C) (Oral)  Resp 16  Ht 6' (1.829 m)  Wt 260 lb (117.935 kg)  BMI 35.25 kg/m2  SpO2 100%  General well-developed well-nourished male no apparent distress alert and going 3 Right leg with well-healed stab phlebectomy sites thickly below the knee. Mild ecchymosis along course of great saphenous vein from knee to inguinal crease with mild tenderness to palpation. 3+ dorsalis pedis pulse palpable.  Today I'll order venous duplex exam of the right leg which I reviewed and interpreted. There is no DVT. The great saphenous vein is totally closed from the distal thigh to near the saphenofemoral junction.     Assessment:     Successful well-tolerated laser ablation right great saphenous vein with multiple stab phlebectomy of painful varicosities     Plan:     Will return to see us on a when necessary basis He does have known reflux and an anterior accessory branch which could cause recurrent varicosities in the future and I discussed this with him. He will return if this becomes an issue

## 2015-01-29 ENCOUNTER — Encounter (HOSPITAL_COMMUNITY): Payer: Self-pay | Admitting: *Deleted

## 2015-01-29 ENCOUNTER — Emergency Department (HOSPITAL_COMMUNITY): Payer: 59

## 2015-01-29 ENCOUNTER — Emergency Department (HOSPITAL_COMMUNITY)
Admission: EM | Admit: 2015-01-29 | Discharge: 2015-01-29 | Disposition: A | Discharge status: Home / Self Care | Payer: 59 | Attending: Emergency Medicine | Admitting: Emergency Medicine

## 2015-01-29 DIAGNOSIS — Z8679 Personal history of other diseases of the circulatory system: Secondary | ICD-10-CM | POA: Diagnosis not present

## 2015-01-29 DIAGNOSIS — Z79899 Other long term (current) drug therapy: Secondary | ICD-10-CM | POA: Diagnosis not present

## 2015-01-29 DIAGNOSIS — G40909 Epilepsy, unspecified, not intractable, without status epilepticus: Secondary | ICD-10-CM | POA: Insufficient documentation

## 2015-01-29 DIAGNOSIS — R1032 Left lower quadrant pain: Secondary | ICD-10-CM | POA: Insufficient documentation

## 2015-01-29 DIAGNOSIS — K219 Gastro-esophageal reflux disease without esophagitis: Secondary | ICD-10-CM | POA: Insufficient documentation

## 2015-01-29 DIAGNOSIS — K469 Unspecified abdominal hernia without obstruction or gangrene: Secondary | ICD-10-CM | POA: Diagnosis not present

## 2015-01-29 LAB — URINALYSIS, ROUTINE W REFLEX MICROSCOPIC
Bilirubin Urine: NEGATIVE
Glucose, UA: NEGATIVE mg/dL
Hgb urine dipstick: NEGATIVE
Ketones, ur: NEGATIVE mg/dL
Leukocytes, UA: NEGATIVE
Nitrite: NEGATIVE
Protein, ur: NEGATIVE mg/dL
Specific Gravity, Urine: 1.021 (ref 1.005–1.030)
Urobilinogen, UA: 0.2 mg/dL (ref 0.0–1.0)
pH: 6 (ref 5.0–8.0)

## 2015-01-29 LAB — CBC WITH DIFFERENTIAL/PLATELET
Basophils Absolute: 0.1 10*3/uL (ref 0.0–0.1)
Basophils Relative: 1 % (ref 0–1)
Eosinophils Absolute: 0.1 10*3/uL (ref 0.0–0.7)
Eosinophils Relative: 1 % (ref 0–5)
HCT: 44.9 % (ref 39.0–52.0)
Hemoglobin: 15.4 g/dL (ref 13.0–17.0)
LYMPHS ABS: 1.8 10*3/uL (ref 0.7–4.0)
Lymphocytes Relative: 21 % (ref 12–46)
MCH: 31 pg (ref 26.0–34.0)
MCHC: 34.3 g/dL (ref 30.0–36.0)
MCV: 90.3 fL (ref 78.0–100.0)
MONO ABS: 0.5 10*3/uL (ref 0.1–1.0)
Monocytes Relative: 6 % (ref 3–12)
Neutro Abs: 6.3 10*3/uL (ref 1.7–7.7)
Neutrophils Relative %: 71 % (ref 43–77)
PLATELETS: 321 10*3/uL (ref 150–400)
RBC: 4.97 MIL/uL (ref 4.22–5.81)
RDW: 12.8 % (ref 11.5–15.5)
WBC: 8.8 10*3/uL (ref 4.0–10.5)

## 2015-01-29 LAB — COMPREHENSIVE METABOLIC PANEL
ALBUMIN: 4.5 g/dL (ref 3.5–5.2)
ALK PHOS: 57 U/L (ref 39–117)
ALT: 41 U/L (ref 0–53)
AST: 31 U/L (ref 0–37)
Anion gap: 11 (ref 5–15)
BUN: 15 mg/dL (ref 6–23)
CALCIUM: 9.2 mg/dL (ref 8.4–10.5)
CO2: 26 mmol/L (ref 19–32)
CREATININE: 1.22 mg/dL (ref 0.50–1.35)
Chloride: 103 mmol/L (ref 96–112)
GFR calc Af Amer: 82 mL/min — ABNORMAL LOW (ref >90)
GFR calc non Af Amer: 71 mL/min — ABNORMAL LOW (ref >90)
Glucose, Bld: 108 mg/dL — ABNORMAL HIGH (ref 70–99)
Potassium: 3.9 mmol/L (ref 3.5–5.1)
Sodium: 140 mmol/L (ref 135–145)
TOTAL PROTEIN: 7.4 g/dL (ref 6.0–8.3)
Total Bilirubin: 1 mg/dL (ref 0.3–1.2)

## 2015-01-29 LAB — LIPASE, BLOOD: Lipase: 25 U/L (ref 11–59)

## 2015-01-29 MED ORDER — IOHEXOL 300 MG/ML  SOLN
50.0000 mL | Freq: Once | INTRAMUSCULAR | Status: AC | PRN
  Administered 2015-01-29: 50 mL via ORAL

## 2015-01-29 MED ORDER — MORPHINE SULFATE 4 MG/ML IJ SOLN
4.0000 mg | Freq: Once | INTRAMUSCULAR | Status: AC
  Administered 2015-01-29: 4 mg via INTRAVENOUS
  Filled 2015-01-29: qty 1

## 2015-01-29 MED ORDER — HYDROMORPHONE HCL 1 MG/ML IJ SOLN
1.0000 mg | Freq: Once | INTRAMUSCULAR | Status: AC
  Administered 2015-01-29: 1 mg via INTRAVENOUS
  Filled 2015-01-29: qty 1

## 2015-01-29 MED ORDER — IOHEXOL 300 MG/ML  SOLN
100.0000 mL | Freq: Once | INTRAMUSCULAR | Status: AC | PRN
  Administered 2015-01-29: 100 mL via INTRAVENOUS

## 2015-01-29 MED ORDER — OXYCODONE-ACETAMINOPHEN 5-325 MG PO TABS: 1.0000 | ORAL_TABLET | Freq: Four times a day (QID) | ORAL | Status: DC | PRN

## 2015-01-29 MED ORDER — KETOROLAC TROMETHAMINE 30 MG/ML IJ SOLN
30.0000 mg | Freq: Once | INTRAMUSCULAR | Status: AC
  Administered 2015-01-29: 30 mg via INTRAVENOUS
  Filled 2015-01-29: qty 1

## 2015-01-29 MED ORDER — ONDANSETRON HCL 4 MG/2ML IJ SOLN
4.0000 mg | Freq: Once | INTRAMUSCULAR | Status: AC
  Administered 2015-01-29: 4 mg via INTRAVENOUS
  Filled 2015-01-29: qty 2

## 2015-01-29 NOTE — ED Provider Notes (Signed)
CSN: [id number]     Arrival date & time 01/29/15  1224 History   First MD Initiated Contact with Patient 01/29/15 1542     Chief Complaint  Patient presents with  . Abdominal Pain  . Hernia     (Consider location/radiation/quality/duration/timing/severity/associated sxs/prior Treatment) HPI Patient presents to the emergency department with abdominal pain that started yesterday.  The patient states he bent over to pick something up and he felt a sharp pain in his left lower abdomen that radiates down into his groin and testicle region.  Patient states that he called his general surgeon, who advised him.  They were not able seen until next week and that he needs to follow-up with his primary care doctor.  He was seen by his primary care doctor today who sent him to the emergency department for further evaluation.  The patient states that the pain has been constant, seems somewhat worse with position and movement.  Patient denies chest pain, shortness of breath, nausea, vomiting, weakness, denies headache, blurred vision, back pain, dysuria, incontinence, diarrhea, bloody stool, hematuria, cough, or syncope.  The patient states that nothing seems make his condition, better Past Medical History  Diagnosis Date  . Seizures   . Acid reflux   . Varicose veins    Past Surgical History  Procedure Laterality Date  . Hernia repair    . Knee surgery    . Varicose vein surgery     Family History  Problem Relation Age of Onset  . Hypertension Mother   . COPD Mother   . Cancer Mother [id number]    breast  . Hypertension Father   . Hypertension Sister    History  Substance Use Topics  . Smoking status: Never Smoker   . Smokeless tobacco: Not on file  . Alcohol Use: 7.2 oz/week    12 Cans of beer per week     Comment: approx 6 beers a week    Review of Systems  All other systems negative except as documented in the HPI. All pertinent positives and negatives as reviewed in the HPI.   Allergies    Review of patient's allergies indicates no known allergies.  Home Medications   Prior to Admission medications   Medication Sig Start Date End Date Taking? Authorizing Provider  LamoTRIgine XR 200 MG TB24 Take by mouth.   Yes Historical Provider, MD  pantoprazole (PROTONIX) 40 MG tablet Take 40 mg by mouth daily.   Yes Historical Provider, MD  Soft Lens Products (CLEAR EYES CONTACT LENS RELIEF) SOLN by Does not apply route as directed. As needed for rewetting contact lenses.   Yes Historical Provider, MD  esomeprazole (NEXIUM) 40 MG capsule Take 40 mg by mouth daily before breakfast.    Historical Provider, MD  ibuprofen (ADVIL,MOTRIN) 600 MG tablet Take 1 tablet (600 mg total) by mouth every 6 (six) hours as needed. Patient not taking: Reported on 01/29/2015 03/12/14   Zadie Rhineonald Wickline, MD  lamoTRIgine (LAMICTAL) 200 MG tablet Take 200 mg by mouth daily.    Historical Provider, MD  LORazepam (ATIVAN) 1 MG tablet Take 1 tablet (1 mg total) by mouth 2 (two) times daily. Patient not taking: Reported on 01/29/2015 03/12/14   Zadie Rhineonald Wickline, MD  oxyCODONE-acetaminophen (PERCOCET/ROXICET) 5-325 MG per tablet Take 1 tablet by mouth every 6 (six) hours as needed for severe pain. 01/29/15   Evamae Rowen, PA-C   BP 145/81 mmHg  Pulse 82  Temp(Src) 98 F (36.7 C) (Oral)  Resp 16  SpO2 98% Physical Exam  Constitutional: He is oriented to person, place, and time. He appears well-developed and well-nourished. No distress.  HENT:  Head: Normocephalic and atraumatic.  Mouth/Throat: Oropharynx is clear and moist.  Eyes: Pupils are equal, round, and reactive to light.  Neck: Normal range of motion. Neck supple.  Cardiovascular: Normal rate, regular rhythm and normal heart sounds.  Exam reveals no gallop and no friction rub.   No murmur heard. Pulmonary/Chest: Effort normal and breath sounds normal. No respiratory distress.  Abdominal: Soft. Bowel sounds are normal. He exhibits no distension. There is  tenderness. There is no rebound and no guarding. Hernia confirmed negative in the right inguinal area and confirmed negative in the left inguinal area.    Genitourinary: Penis normal. Right testis shows no mass, no swelling and no tenderness. Left testis shows no mass, no swelling and no tenderness. No penile tenderness.  Lymphadenopathy:       Right: No inguinal adenopathy present.       Left: No inguinal adenopathy present.  Neurological: He is alert and oriented to person, place, and time. He exhibits normal muscle tone. Coordination normal.  Skin: Skin is warm and dry. No rash noted. No erythema.  Nursing note and vitals reviewed.   ED Course  Procedures (including critical care time) Labs Review Labs Reviewed  COMPREHENSIVE METABOLIC PANEL - Abnormal; Notable for the following:    Glucose, Bld 108 (*)    GFR calc non Af Amer 71 (*)    GFR calc Af Amer 82 (*)    All other components within normal limits  CBC WITH DIFFERENTIAL/PLATELET  LIPASE, BLOOD  URINALYSIS, ROUTINE W REFLEX MICROSCOPIC    Imaging Review Ct Abdomen Pelvis W Contrast  01/29/2015   CLINICAL DATA:  Left lower quadrant pain, nausea/vomiting, evaluate for possible hernia  EXAM: CT ABDOMEN AND PELVIS WITH CONTRAST  TECHNIQUE: Multidetector CT imaging of the abdomen and pelvis was performed using the standard protocol following bolus administration of intravenous contrast.  CONTRAST:  OMNIPAQUE IOHEXOL 300 MG/ML  SOLN  COMPARISON:  None.  FINDINGS: Lower chest:  Lung bases are clear.  Hepatobiliary: Multiple hepatic cysts measuring up to 3.0 cm in the lateral segment left hepatic lobe (series 2/ image 18).  Gallbladder is unremarkable. No intrahepatic or extrahepatic ductal dilatation.  Pancreas: Within normal limits.  Spleen: Within normal limits.  Adrenals/Urinary Tract: Adrenal glands are within normal limits.  Kidneys within normal limits.  No hydronephrosis.  Bladder is within normal limits.  Stomach/Bowel:  Stomach is within normal limits.  No evidence of bowel obstruction.  Normal appendix.  No colonic wall thickening or inflammatory changes.  Vascular/Lymphatic: No evidence of abdominal aortic aneurysm.  No suspicious abdominopelvic lymphadenopathy.  Reproductive: Prostate is unremarkable.  Other: No evidence inguinal hernia.  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  Postsurgical changes related to prior ventral hernia repair.  Musculoskeletal: Visualized osseous structures are within normal limits.  IMPRESSION: No evidence of inguinal hernia.  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  Postsurgical changes related to prior ventral hernia repair.   Electronically Signed   By: Charline Bills M.D.   On: 01/29/2015 18:08   Patient does not have any significant findings other than fat within the inguinal canal.  The patient and wife earlier not very pleased that we are not: General surgery come see the patient.  I talked, about this and advised that they will need to follow-up tomorrow with them as there is no acute emergency that is present on his lab testing or CT scan.  I advised him that this could be related to the fat within the inguinal canal versus abdominal wall issues.  This could be an evolving process is not yet fully declare itself.  I advised them to return here for any worsening in his condition.  They both seem very unhappy with this decision.  Dr. Romeo Apple also went and spoke with them as well and concurred with the current plan  Charlestine Night, PA-C 01/30/15 0010  Purvis Sheffield, MD 01/30/15 [phone]

## 2015-01-29 NOTE — ED Notes (Signed)
Pt in from Dr. Modesto [facility]Wong at [facility] for eval of possible hernia, severe pain began suddenly yesterday evening. Worse with movement and sitting. N/v last night r/t pain. Denies urinary or bowel symptoms.

## 2015-01-29 NOTE — Discharge Instructions (Signed)
Return here as needed.  Follow-up with your surgeon. °

## 2015-02-01 NOTE — Progress Notes (Signed)
Pt. Called short stay stating, [facility], what time do I need to arrive" Pt. Instructed to arrive at short Stay at 5:15 am, pt. Verbalized understanding.

## 2015-02-03 NOTE — H&P (Signed)
History of Present Illness Joshua Conner(Joshua Gavitt MD; 01/30/2015 4:12 PM) Patient words: Hernia.  The patient is a 44 year old male who presents with an inguinal hernia. Patient is a 44 year old male who is referred from wasn't long EGD for evaluation of a left inguinal hernia. Patient states approximately 2 days ago he was bending over to pick up a baseball and felt large amount of pain to the left inguinal/paraumbilical area. Patient presented to the ER yesterday underwent a CT scan which revealed a left fat-containing hernia. The patient does state the pain radiates down to the left leg and left scrotum. Patient is not feel any bulge in that area.   Other Problems [name](Sara Sakowski, LPN; 1/6/10964/03/2015 0:453:38 PM) Gastroesophageal Reflux Disease Seizure Disorder  Past Surgical History [name](Sara Sakowski, LPN; 4/0/98114/03/2015 9:143:38 PM) Knee Surgery Right. Resection of Stomach  Diagnostic Studies History [name](Sara Sakowski, LPN; 7/8/29564/03/2015 2:133:38 PM) Colonoscopy never  Allergies [name](Sara [place], LPN; 0/8/65784/03/2015 4:693:39 PM) No Known Drug Allergies04/03/2015  Medication History [name](Sara Sakowski, LPN; 6/2/95284/03/2015 4:133:40 PM) LaMICtal XR (200MG  Tablet ER 24HR, Oral daily) Active. Omeprazole (40MG  Capsule DR, Oral daily) Active. Percocet (5-325MG  Tablet, Oral as needed) Active.  Social History [name](Sara Sakowski, LPN; 2/4/40104/03/2015 2:723:38 PM) Alcohol use Moderate alcohol use. Caffeine use Tea. No drug use Tobacco use Never smoker.  Family History [name](Sara Sakowski, LPN; 5/3/66444/03/2015 0:343:38 PM) Arthritis Father, Mother. Breast Cancer Mother. Cancer Mother. Hypertension Father, Sister. Migraine Headache Sister.  Review of Systems [name]([facility]ara [facility] LPN; 7/4/25954/03/2015 6:383:38 PM) General Not Present- Appetite Loss, Chills, Fatigue, Fever, Night Sweats, Weight Gain and Weight Loss. Skin Not Present- Change in Wart/Mole, Dryness, Hives, Jaundice, New Lesions, Non-Healing Wounds, Rash and Ulcer. HEENT Present- Seasonal Allergies and Wears  glasses/contact lenses. Not Present- Earache, Hearing Loss, Hoarseness, Nose Bleed, Oral Ulcers, Ringing in the Ears, Sinus Pain, Sore Throat, Visual Disturbances and Yellow Eyes. Respiratory Not Present- Bloody sputum, Chronic Cough, Difficulty Breathing, Snoring and Wheezing. Breast Not Present- Breast Mass, Breast Pain, Nipple Discharge and Skin Changes. Cardiovascular Not Present- Chest Pain, Difficulty Breathing Lying Down, Leg Cramps, Palpitations, Rapid Heart Rate, Shortness of Breath and Swelling of Extremities. Gastrointestinal Present- Abdominal Pain. Not Present- Bloating, Bloody Stool, Change in Bowel Habits, Chronic diarrhea, Constipation, Difficulty Swallowing, Excessive gas, Gets full quickly at meals, Hemorrhoids, Indigestion, Nausea, Rectal Pain and Vomiting. Male Genitourinary Present- Frequency. Not Present- Blood in Urine, Change in Urinary Stream, Impotence, Nocturia, Painful Urination, Urgency and Urine Leakage. Musculoskeletal Not Present- Back Pain, Joint Pain, Joint Stiffness, Muscle Pain, Muscle Weakness and Swelling of Extremities. Neurological Present- Seizures. Not Present- Decreased Memory, Fainting, Headaches, Numbness, Tingling, Tremor, Trouble walking and Weakness. Psychiatric Not Present- Anxiety, Bipolar, Change in Sleep Pattern, Depression, Fearful and Frequent crying. Endocrine Not Present- Cold Intolerance, Excessive Hunger, Hair Changes, Heat Intolerance, Hot flashes and New Diabetes. Hematology Not Present- Easy Bruising, Excessive bleeding, Gland problems, HIV and Persistent Infections.   Vitals [name]([facility]; 7/5/64334/03/2015 2:953:39 PM) 01/30/2015 3:39 PM Weight: 284.38 lb Height: 72in Body Surface Area: 2.56 m Body Mass Index: 38.57 kg/m Temp.: 98.71F(Oral)  Pulse: 82 (Regular)  BP: 160/80 (Sitting, Right Arm, Standard)    Physical Exam Joshua Conner(Jevaeh Shams MD; 01/30/2015 4:12 PM) General Mental Status-Alert. General Appearance-Consistent with  stated age. Hydration-Well hydrated. Voice-Normal.  Head and Neck Head-normocephalic, atraumatic with no lesions or palpable masses. Trachea-midline. Thyroid Gland Characteristics - normal size and consistency.  Chest and Lung Exam Chest and lung exam reveals -quiet, even and easy respiratory effort with no use of accessory  muscles and on auscultation, normal breath sounds, no adventitious sounds and normal vocal resonance. Inspection Chest Wall - Normal. Back - normal.  Cardiovascular Cardiovascular examination reveals -normal heart sounds, regular rate and rhythm with no murmurs and normal pedal pulses bilaterally.  Abdomen Inspection Skin - Scar - no surgical scars. Hernias - Inguinal hernia - Left - Reducible(min). Palpation/Percussion Normal exam - Soft, Non Tender, No Rebound tenderness, No Rigidity (guarding) and No hepatosplenomegaly. Auscultation Normal exam - Bowel sounds normal.    Assessment & Plan Joshua Filler MD; 01/30/2015 4:15 PM) LEFT INGUINAL HERNIA (550.90  K40.90) Impression: 44 year old male with a small left inguinal hernia.  1. The patient will like to proceed operating for laparoscopic left inguinal hernia repair with mesh. 2. All risks and benefits were discussed with the patient to generally include, but not limited to: infection, bleeding, damage to surrounding structures, acute and chronic nerve pain, and recurrence. Alternatives were offered and described. All questions were answered and the patient voiced understanding of the procedure and wishes to proceed at this point with hernia repair.

## 2015-02-04 ENCOUNTER — Ambulatory Visit (HOSPITAL_COMMUNITY): Payer: 59 | Admitting: Registered Nurse

## 2015-02-04 ENCOUNTER — Ambulatory Visit (HOSPITAL_COMMUNITY)
Admission: RE | Admit: 2015-02-04 | Discharge: 2015-02-04 | Disposition: A | Discharge status: Home / Self Care | Payer: 59 | Source: Ambulatory Visit | Attending: General Surgery | Admitting: General Surgery

## 2015-02-04 ENCOUNTER — Encounter (HOSPITAL_COMMUNITY): Payer: Self-pay | Admitting: *Deleted

## 2015-02-04 ENCOUNTER — Encounter (HOSPITAL_COMMUNITY)
Admission: RE | Disposition: A | Discharge status: Home / Self Care | Payer: Self-pay | Source: Ambulatory Visit | Attending: General Surgery

## 2015-02-04 DIAGNOSIS — E669 Obesity, unspecified: Secondary | ICD-10-CM | POA: Insufficient documentation

## 2015-02-04 DIAGNOSIS — K219 Gastro-esophageal reflux disease without esophagitis: Secondary | ICD-10-CM | POA: Insufficient documentation

## 2015-02-04 DIAGNOSIS — Z6838 Body mass index (BMI) 38.0-38.9, adult: Secondary | ICD-10-CM | POA: Diagnosis not present

## 2015-02-04 DIAGNOSIS — K4090 Unilateral inguinal hernia, without obstruction or gangrene, not specified as recurrent: Secondary | ICD-10-CM | POA: Insufficient documentation

## 2015-02-04 DIAGNOSIS — G40909 Epilepsy, unspecified, not intractable, without status epilepticus: Secondary | ICD-10-CM | POA: Diagnosis not present

## 2015-02-04 HISTORY — PX: INGUINAL HERNIA REPAIR: SHX194

## 2015-02-04 HISTORY — PX: INSERTION OF MESH: SHX5868

## 2015-02-04 SURGERY — REPAIR, HERNIA, INGUINAL, LAPAROSCOPIC
Anesthesia: General | Site: Groin | Laterality: Left

## 2015-02-04 MED ORDER — GLYCOPYRROLATE 0.2 MG/ML IJ SOLN
INTRAMUSCULAR | Status: DC | PRN
  Administered 2015-02-04: .8 mg via INTRAVENOUS

## 2015-02-04 MED ORDER — FENTANYL CITRATE 0.05 MG/ML IJ SOLN
25.0000 ug | INTRAMUSCULAR | Status: DC | PRN
Start: 2015-02-04 — End: 2015-02-04
  Administered 2015-02-04 (×3): 50 ug via INTRAVENOUS

## 2015-02-04 MED ORDER — PROMETHAZINE HCL 25 MG/ML IJ SOLN: 6.2500 mg | INTRAMUSCULAR | Status: DC | PRN

## 2015-02-04 MED ORDER — SODIUM CHLORIDE 0.9 % IJ SOLN
INTRAMUSCULAR | Status: AC
  Filled 2015-02-04: qty 10

## 2015-02-04 MED ORDER — LIDOCAINE HCL (CARDIAC) 20 MG/ML IV SOLN
INTRAVENOUS | Status: AC
  Filled 2015-02-04: qty 5

## 2015-02-04 MED ORDER — FENTANYL CITRATE 0.05 MG/ML IJ SOLN
INTRAMUSCULAR | Status: AC
  Filled 2015-02-04: qty 2

## 2015-02-04 MED ORDER — LIDOCAINE HCL (CARDIAC) 20 MG/ML IV SOLN
INTRAVENOUS | Status: DC | PRN
  Administered 2015-02-04: 100 mg via INTRAVENOUS

## 2015-02-04 MED ORDER — ROCURONIUM BROMIDE 100 MG/10ML IV SOLN
INTRAVENOUS | Status: DC | PRN
  Administered 2015-02-04: 10 mg via INTRAVENOUS
  Administered 2015-02-04: 40 mg via INTRAVENOUS

## 2015-02-04 MED ORDER — SODIUM CHLORIDE 0.9 % IV SOLN: 250.0000 mL | INTRAVENOUS | Status: DC | PRN

## 2015-02-04 MED ORDER — SODIUM CHLORIDE 0.9 % IJ SOLN: 3.0000 mL | Freq: Two times a day (BID) | INTRAMUSCULAR | Status: DC

## 2015-02-04 MED ORDER — PROPOFOL 10 MG/ML IV BOLUS
INTRAVENOUS | Status: AC
  Filled 2015-02-04: qty 20

## 2015-02-04 MED ORDER — LACTATED RINGERS IV SOLN
INTRAVENOUS | Status: DC | PRN
  Administered 2015-02-04: 07:00:00 via INTRAVENOUS

## 2015-02-04 MED ORDER — SODIUM CHLORIDE 0.9 % IJ SOLN: 3.0000 mL | INTRAMUSCULAR | Status: DC | PRN

## 2015-02-04 MED ORDER — ONDANSETRON HCL 4 MG/2ML IJ SOLN
INTRAMUSCULAR | Status: DC | PRN
  Administered 2015-02-04: 4 mg via INTRAVENOUS

## 2015-02-04 MED ORDER — CHLORHEXIDINE GLUCONATE 4 % EX LIQD: 1.0000 "application " | Freq: Once | CUTANEOUS | Status: DC

## 2015-02-04 MED ORDER — HYDROMORPHONE HCL 1 MG/ML IJ SOLN
INTRAMUSCULAR | Status: AC
  Filled 2015-02-04: qty 1

## 2015-02-04 MED ORDER — FENTANYL CITRATE 0.05 MG/ML IJ SOLN
INTRAMUSCULAR | Status: AC
  Filled 2015-02-04: qty 5

## 2015-02-04 MED ORDER — ACETAMINOPHEN 650 MG RE SUPP: 650.0000 mg | RECTAL | Status: DC | PRN

## 2015-02-04 MED ORDER — DEXTROSE 5 % IV SOLN
3.0000 g | INTRAVENOUS | Status: AC
  Administered 2015-02-04: 3 g via INTRAVENOUS
  Filled 2015-02-04: qty 3000

## 2015-02-04 MED ORDER — FENTANYL CITRATE 0.05 MG/ML IJ SOLN
INTRAMUSCULAR | Status: DC | PRN
  Administered 2015-02-04 (×5): 50 ug via INTRAVENOUS

## 2015-02-04 MED ORDER — PROPOFOL 10 MG/ML IV BOLUS
INTRAVENOUS | Status: DC | PRN
  Administered 2015-02-04: 250 mg via INTRAVENOUS

## 2015-02-04 MED ORDER — OXYCODONE-ACETAMINOPHEN 7.5-325 MG PO TABS: 1.0000 | ORAL_TABLET | ORAL | Status: DC | PRN

## 2015-02-04 MED ORDER — ROCURONIUM BROMIDE 100 MG/10ML IV SOLN
INTRAVENOUS | Status: AC
  Filled 2015-02-04: qty 1

## 2015-02-04 MED ORDER — OXYCODONE HCL 5 MG PO TABS
5.0000 mg | ORAL_TABLET | ORAL | Status: DC | PRN
  Administered 2015-02-04: 5 mg via ORAL
  Filled 2015-02-04: qty 1

## 2015-02-04 MED ORDER — ACETAMINOPHEN 325 MG PO TABS: 650.0000 mg | ORAL_TABLET | ORAL | Status: DC | PRN

## 2015-02-04 MED ORDER — MEPERIDINE HCL 50 MG/ML IJ SOLN: 6.2500 mg | INTRAMUSCULAR | Status: DC | PRN

## 2015-02-04 MED ORDER — NEOSTIGMINE METHYLSULFATE 10 MG/10ML IV SOLN
INTRAVENOUS | Status: AC
  Filled 2015-02-04: qty 1

## 2015-02-04 MED ORDER — HYDROMORPHONE HCL 1 MG/ML IJ SOLN
0.5000 mg | INTRAMUSCULAR | Status: DC | PRN
  Administered 2015-02-04 (×3): 0.5 mg via INTRAVENOUS

## 2015-02-04 MED ORDER — GLYCOPYRROLATE 0.2 MG/ML IJ SOLN
INTRAMUSCULAR | Status: AC
  Filled 2015-02-04: qty 4

## 2015-02-04 MED ORDER — ONDANSETRON HCL 4 MG/2ML IJ SOLN
INTRAMUSCULAR | Status: AC
  Filled 2015-02-04: qty 2

## 2015-02-04 MED ORDER — 0.9 % SODIUM CHLORIDE (POUR BTL) OPTIME
TOPICAL | Status: DC | PRN
  Administered 2015-02-04: 1000 mL

## 2015-02-04 MED ORDER — NEOSTIGMINE METHYLSULFATE 10 MG/10ML IV SOLN
INTRAVENOUS | Status: DC | PRN
  Administered 2015-02-04: 5 mg via INTRAVENOUS

## 2015-02-04 MED ORDER — MIDAZOLAM HCL 2 MG/2ML IJ SOLN
INTRAMUSCULAR | Status: AC
  Filled 2015-02-04: qty 2

## 2015-02-04 MED ORDER — EPHEDRINE SULFATE 50 MG/ML IJ SOLN
INTRAMUSCULAR | Status: AC
  Filled 2015-02-04: qty 1

## 2015-02-04 MED ORDER — LACTATED RINGERS IV SOLN: INTRAVENOUS | Status: DC

## 2015-02-04 MED ORDER — MIDAZOLAM HCL 5 MG/5ML IJ SOLN
INTRAMUSCULAR | Status: DC | PRN
  Administered 2015-02-04: 2 mg via INTRAVENOUS

## 2015-02-04 MED ORDER — SUCCINYLCHOLINE CHLORIDE 20 MG/ML IJ SOLN
INTRAMUSCULAR | Status: DC | PRN
  Administered 2015-02-04: 100 mg via INTRAVENOUS

## 2015-02-04 MED ORDER — BUPIVACAINE-EPINEPHRINE 0.25% -1:200000 IJ SOLN
INTRAMUSCULAR | Status: AC
  Filled 2015-02-04: qty 1

## 2015-02-04 MED ORDER — BUPIVACAINE-EPINEPHRINE 0.25% -1:200000 IJ SOLN
INTRAMUSCULAR | Status: DC | PRN
  Administered 2015-02-04: 14 mL

## 2015-02-04 MED ORDER — LACTATED RINGERS IR SOLN
Status: DC | PRN
  Administered 2015-02-04: 1000 mL

## 2015-02-04 SURGICAL SUPPLY — 34 items
BAG URINE DRAINAGE (UROLOGICAL SUPPLIES) ×3 IMPLANT
BENZOIN TINCTURE PRP APPL 2/3 (GAUZE/BANDAGES/DRESSINGS) ×3 IMPLANT
CABLE HIGH FREQUENCY MONO STRZ (ELECTRODE) ×3 IMPLANT
CATH FOLEY 3WAY 30CC 16FR (CATHETERS) ×3 IMPLANT
CHLORAPREP W/TINT 26ML (MISCELLANEOUS) ×3 IMPLANT
CLOSURE WOUND 1/2 X4 (GAUZE/BANDAGES/DRESSINGS) ×1
DECANTER SPIKE VIAL GLASS SM (MISCELLANEOUS) ×3 IMPLANT
DRAPE LAPAROSCOPIC ABDOMINAL (DRAPES) ×3 IMPLANT
ELECT REM PT RETURN 9FT ADLT (ELECTROSURGICAL) ×3
ELECTRODE REM PT RTRN 9FT ADLT (ELECTROSURGICAL) ×1 IMPLANT
GAUZE SPONGE 2X2 8PLY STRL LF (GAUZE/BANDAGES/DRESSINGS) ×1 IMPLANT
GLOVE BIO SURGEON STRL SZ7.5 (GLOVE) ×3 IMPLANT
GOWN STRL REUS W/TWL XL LVL3 (GOWN DISPOSABLE) ×9 IMPLANT
HOVERMATT SINGLE USE (MISCELLANEOUS) ×3 IMPLANT
KIT BASIN OR (CUSTOM PROCEDURE TRAY) ×3 IMPLANT
MESH 3DMAX 4X6 LT LRG (Mesh General) ×3 IMPLANT
PLUG CATH AND CAP STER (CATHETERS) ×3 IMPLANT
RELOAD STAPLE HERNIA 4.0 BLUE (INSTRUMENTS) ×3 IMPLANT
SCISSORS LAP 5X35 DISP (ENDOMECHANICALS) ×3 IMPLANT
SET IRRIG TUBING LAPAROSCOPIC (IRRIGATION / IRRIGATOR) ×3 IMPLANT
SET IRRIG Y TYPE TUR BLADDER L (SET/KITS/TRAYS/PACK) ×3 IMPLANT
SOLUTION ANTI FOG 6CC (MISCELLANEOUS) IMPLANT
SPONGE GAUZE 2X2 STER 10/PKG (GAUZE/BANDAGES/DRESSINGS) ×2
STAPLER HERNIA 12 8.5 360D (INSTRUMENTS) ×3 IMPLANT
STRIP CLOSURE SKIN 1/2X4 (GAUZE/BANDAGES/DRESSINGS) ×2 IMPLANT
SUT MNCRL AB 4-0 PS2 18 (SUTURE) ×3 IMPLANT
TAPE CLOTH SURG 4X10 WHT LF (GAUZE/BANDAGES/DRESSINGS) ×3 IMPLANT
TOWEL OR 17X26 10 PK STRL BLUE (TOWEL DISPOSABLE) ×3 IMPLANT
TOWEL OR NON WOVEN STRL DISP B (DISPOSABLE) ×3 IMPLANT
TRAY FOLEY CATH 14FRSI W/METER (CATHETERS) IMPLANT
TRAY LAPAROSCOPIC (CUSTOM PROCEDURE TRAY) ×3 IMPLANT
TROCAR CANNULA W/PORT DUAL 5MM (MISCELLANEOUS) ×3 IMPLANT
TROCAR XCEL 12X100 BLDLESS (ENDOMECHANICALS) ×3 IMPLANT
WATER STERILE IRR 500ML POUR (IV SOLUTION) ×3 IMPLANT

## 2015-02-04 NOTE — Progress Notes (Signed)
Pt cont to c/o pain; Dr. Renold DonGermeroth notified, order rec'd and additional pain med given

## 2015-02-04 NOTE — Progress Notes (Signed)
No orders in EPIC Dr. Derrell Lollingamirez paged.

## 2015-02-04 NOTE — Anesthesia Procedure Notes (Signed)
Procedure Name: Intubation Date/Time: 02/04/2015 7:29 AM Performed by: Jarvis NewcomerARMISTEAD, Mikelle Myrick A Pre-anesthesia Checklist: Timeout performed, Patient identified, Emergency Drugs available, Suction available and Patient being monitored Patient Re-evaluated:Patient Re-evaluated prior to inductionOxygen Delivery Method: Circle system utilized Preoxygenation: Pre-oxygenation with 100% oxygen Intubation Type: IV induction Ventilation: Mask ventilation without difficulty Laryngoscope Size: Mac and 4 Grade View: Grade I Tube type: Oral Tube size: 7.5 mm Number of attempts: 1 Airway Equipment and Method: Stylet Placement Confirmation: ETT inserted through vocal cords under direct vision,  breath sounds checked- equal and bilateral and positive ETCO2 Secured at: 22 cm Tube secured with: Tape Dental Injury: Teeth and Oropharynx as per pre-operative assessment

## 2015-02-04 NOTE — Op Note (Signed)
02/04/2015  8:26 AM  PATIENT:  Melina ModenaScott Novella  44 y.o. male  PRE-OPERATIVE DIAGNOSIS:  LEFT INGUINAL HERNIA  POST-OPERATIVE DIAGNOSIS:  LEFT INDIRECT INGUINAL HERNIA  PROCEDURE:  Procedure(s): LAPAROSCOPIC LEFT  INGUINAL HERNIA REPAIR WITH MESH (Left) INSERTION OF MESH (Left)  SURGEON:  Surgeon(s) and Role:    * Axel FillerArmando Namiah Dunnavant, MD - Primary  ANESTHESIA:   local and general  EBL:<5CC Total I/O In: 1000 [I.V.:1000] Out: 165 [Urine:150; Blood:15]  BLOOD ADMINISTERED:none  DRAINS: none   LOCAL MEDICATIONS USED:  BUPIVICAINE   SPECIMEN:  No Specimen  DISPOSITION OF SPECIMEN:  N/A  COUNTS:  YES  TOURNIQUET:  * No tourniquets in log *  DICTATION: .Dragon Dictation Counts: reported as correct x 2  Findings:  The patient had a small left indirect hernia  Indications for procedure:  The patient is a 44 year old male with a left hernia for several days. Patient complained of symptomatology to his left inguinal area. The patient was taken back for elective inguinal hernia repair.  Details of the procedure: The patient was taken back to the operating room. The patient was placed in supine position with bilateral SCDs in place.  The patient was prepped and draped in the usual sterile fashion.  After appropriate anitbiotics were confirmed, a time-out was confirmed and all facts were verified.  0.25% Marcaine was used to infiltrate the umbilical area. A 11-blade was used to cut down the skin and blunt dissection was used to get the anterior fashion.  The anterior fascia was incised approximately 1 cm and the muscles were retracted laterally. Blunt dissection was then used to create a space in the preperitoneal area. At this time a 10 mm camera was then introduced into the space and advanced the pubic tubercle and a 12 mm trocar was placed over this and insufflation was started.  At this time and space was created from medial to laterally the preperitoneal space.  Cooper's ligament was  initially cleaned off.  The hernia sac was identified in the left indirect space. Dissection of the hernia sac was undertaken the vas deferens was identified and protected in all parts of the case.     Once the hernia sac was taken down to approximately the umbilicus a Bard 3D Max, Large mesh was  introduced into the preperitoneal space.  The mesh was brought over the direct and indirect hernia spaces.  This was anchored into place and secured to Cooper's ligament with 4.770mm staples from a Coviden hernia stapler. It was anchored to the anterior abdominal wall with 4.8 mm staples. The hernia sac was seen lying posterior to the mesh. There was no staples placed laterally. The insufflation was evacuated and the peritoneum was seen posterior to the mesh. The trochars were removed. The anterior fascia was reapproximated using #1 Vicryl on a UR- 6.  Intra-abdominal air was evacuated and the Veress needle removed. The skin was reapproximated using 4-0 Monocryl subcuticular fashion the patient was awakened from general anesthesia and taken to recovery in stable condition.   PLAN OF CARE: Discharge to home after PACU  PATIENT DISPOSITION:  PACU - hemodynamically stable.   Delay start of Pharmacological VTE agent (>24hrs) due to surgical blood loss or risk of bleeding: not applicable

## 2015-02-04 NOTE — Anesthesia Preprocedure Evaluation (Addendum)
Anesthesia Evaluation  Patient identified by MRN, date of birth, ID band Patient awake    Reviewed: Allergy & Precautions, NPO status , Patient's Chart, lab work & pertinent test results  Airway Mallampati: II  TM Distance: >3 FB Neck ROM: Full    Dental no notable dental hx.    Pulmonary neg pulmonary ROS,  breath sounds clear to auscultation  Pulmonary exam normal       Cardiovascular - Peripheral Vascular Disease negative cardio ROS  Rhythm:Regular Rate:Normal     Neuro/Psych Seizures -, Well Controlled,  negative psych ROS   GI/Hepatic negative GI ROS, Neg liver ROS, GERD-  Medicated,  Endo/Other  negative endocrine ROS  Renal/GU negative Renal ROS     Musculoskeletal negative musculoskeletal ROS (+)   Abdominal (+) + obese,   Peds  Hematology negative hematology ROS (+)   Anesthesia Other Findings   Reproductive/Obstetrics negative OB ROS                            Anesthesia Physical Anesthesia Plan  ASA: II  Anesthesia Plan: General   Post-op Pain Management:    Induction: Intravenous  Airway Management Planned: Oral ETT  Additional Equipment:   Intra-op Plan:   Post-operative Plan: Extubation in OR  Informed Consent: I have reviewed the patients History and Physical, chart, labs and discussed the procedure including the risks, benefits and alternatives for the proposed anesthesia with the patient or authorized representative who has indicated his/her understanding and acceptance.   Dental advisory given  Plan Discussed with: CRNA  Anesthesia Plan Comments:         Anesthesia Quick Evaluation

## 2015-02-04 NOTE — Transfer of Care (Signed)
Immediate Anesthesia Transfer of Care Note  Patient: Joshua Conner  Procedure(s) Performed: Procedure(s): LAPAROSCOPIC LEFT  INGUINAL HERNIA REPAIR WITH MESH (Left) INSERTION OF MESH (Left)  Patient Location: PACU  Anesthesia Type:General  Level of Consciousness: awake, alert , oriented and patient cooperative  Airway & Oxygen Therapy: Patient Spontanous Breathing and Patient connected to face mask oxygen  Post-op Assessment: Report given to RN, Post -op Vital signs reviewed and stable and Patient moving all extremities  Post vital signs: Reviewed and stable  Last Vitals:  Filed Vitals:   02/04/15 0520  BP: 128/86  Pulse: 90  Temp: 36.6 C  Resp: 20    Complications: No apparent anesthesia complications

## 2015-02-04 NOTE — Discharge Instructions (Signed)
[facility] _______[facility] ° °INGUINAL HERNIA REPAIR: POST OP INSTRUCTIONS ° °Always review your discharge instruction sheet given to you by the facility where your surgery was performed. °IF YOU HAVE DISABILITY OR FAMILY LEAVE FORMS, YOU MUST BRING THEM TO THE OFFICE FOR PROCESSING.   °DO NOT GIVE THEM TO YOUR DOCTOR. ° °1. A  prescription for pain medication may be given to you upon discharge.  Take your pain medication as prescribed, if needed.  If narcotic pain medicine is not needed, then you may take acetaminophen (Tylenol) or ibuprofen (Advil) as needed. °2. Take your usually prescribed medications unless otherwise directed. °3. If you need a refill on your pain medication, please contact your pharmacy.  They will contact our office to request authorization. Prescriptions will not be filled after 5 pm or on week-ends. °4. You should follow a light diet the first 24 hours after arrival home, such as soup and crackers, etc.  Be sure to include lots of fluids daily.  Resume your normal diet the day after surgery. °5. Most patients will experience some swelling and bruising around the umbilicus or in the groin and scrotum.  Ice packs and reclining will help.  Swelling and bruising can take several days to resolve.  °6. It is common to experience some constipation if taking pain medication after surgery.  Increasing fluid intake and taking a stool softener (such as Colace) will usually help or prevent this problem from occurring.  A mild laxative (Milk of Magnesia or Miralax) should be taken according to package directions if there are no bowel movements after 48 hours. °7. Unless discharge instructions indicate otherwise, you may remove your bandages 24-48 hours after surgery, and you may shower at that time.  You may have steri-strips (small skin tapes) in place directly over the incision.  These strips should be left on the skin for 7-10 days.  If your surgeon used skin glue on the incision, you  may shower in 24 hours.  The glue will flake off over the next 2-3 weeks.  Any sutures or staples will be removed at the office during your follow-up visit. °8. ACTIVITIES:  You may resume regular (light) daily activities beginning the next day--such as daily self-care, walking, climbing stairs--gradually increasing activities as tolerated.  You may have sexual intercourse when it is comfortable.  Refrain from any heavy lifting or straining until approved by your doctor. °a. You may drive when you are no longer taking prescription pain medication, you can comfortably wear a seatbelt, and you can safely maneuver your car and apply brakes. °b. RETURN TO WORK:  __________________________________________________________ °9. You should see your doctor in the office for a follow-up appointment approximately 2-3 weeks after your surgery.  Make sure that you call for this appointment within a day or two after you arrive home to insure a convenient appointment time. °10. OTHER INSTRUCTIONS:  __________________________________________________________________________________________________________________________________________________________________________________________  °WHEN TO CALL YOUR DOCTOR: °1. Fever over 101.0 °2. Inability to urinate °3. Nausea and/or vomiting °4. Extreme swelling or bruising °5. Continued bleeding from incision. °6. Increased pain, redness, or drainage from the incision ° °The clinic staff is available to answer your questions during regular business hours.  Please don’t hesitate to call and ask to speak to one of the nurses for clinical concerns.  If you have a medical emergency, go to the nearest emergency room or call 911.  A surgeon from [facility] is always on call at the hospital ° ° °[address], [address], [place], [place]  [address] ? ° [address] [address], [place], [place]   [address] °[phone] ? [phone] ? FAX [phone] °Web site:  [email or web address] ° °

## 2015-02-04 NOTE — Anesthesia Postprocedure Evaluation (Signed)
Anesthesia Post Note  Patient: Joshua ModenaScott Conner  Procedure(s) Performed: Procedure(s) (LRB): LAPAROSCOPIC LEFT  INGUINAL HERNIA REPAIR WITH MESH (Left) INSERTION OF MESH (Left)  Anesthesia type: General  Patient location: PACU  Post pain: Pain level controlled  Post assessment: Post-op Vital signs reviewed  Last Vitals: BP 149/98 mmHg  Pulse 96  Temp(Src) 36.6 C (Oral)  Resp 18  Ht 6' (1.829 m)  Wt 283 lb 6 oz (128.538 kg)  BMI 38.42 kg/m2  SpO2 98%  Post vital signs: Reviewed  Level of consciousness: sedated  Complications: No apparent anesthesia complications

## 2015-02-04 NOTE — Interval H&P Note (Signed)
History and Physical Interval Note:  02/04/2015 6:56 AM  Joshua Conner  has presented today for surgery, with the diagnosis of RIGHT INGUINAL HERNIA  The various methods of treatment have been discussed with the patient and family. After consideration of risks, benefits and other options for treatment, the patient has consented to  Procedure(s): LAPAROSCOPIC LEFT  INGUINAL HERNIA REPAIR WITH MESH (Left) INSERTION OF MESH (Left) as a surgical intervention .  The patient's history has been reviewed, patient examined, no change in status, stable for surgery.  I have reviewed the patient's chart and labs.  Questions were answered to the patient's satisfaction.     Marigene Ehlersamirez Jr., Jed LimerickArmando

## 2015-02-05 ENCOUNTER — Encounter (HOSPITAL_COMMUNITY): Payer: Self-pay | Admitting: General Surgery

## 2015-10-01 ENCOUNTER — Other Ambulatory Visit: Payer: Self-pay | Admitting: General Surgery

## 2015-10-01 ENCOUNTER — Ambulatory Visit (HOSPITAL_COMMUNITY)
Admission: RE | Admit: 2015-10-01 | Discharge: 2015-10-01 | Disposition: A | Discharge status: Home / Self Care | Payer: 59 | Source: Ambulatory Visit | Attending: General Surgery | Admitting: General Surgery

## 2015-10-01 DIAGNOSIS — Z9889 Other specified postprocedural states: Secondary | ICD-10-CM | POA: Diagnosis not present

## 2015-10-01 DIAGNOSIS — R1032 Left lower quadrant pain: Secondary | ICD-10-CM

## 2015-10-01 MED ORDER — IOHEXOL 300 MG/ML  SOLN: 50.0000 mL | Freq: Once | INTRAMUSCULAR | Status: DC | PRN

## 2015-11-22 MED FILL — lamoTRIgine ER 200 MG TB24: 200 | 90 days supply | Qty: 90 | Fill #3

## 2015-11-22 MED FILL — SILDENAFIL 20 MG TABLET: 20 | 6 days supply | Qty: 30 | Fill #1

## 2016-01-22 MED FILL — diazePAM 5 MG TABS: 5 | 1 days supply | Qty: 2 | Fill #0

## 2016-01-22 MED FILL — SILDENAFIL 20 MG TABLET: 20 | 6 days supply | Qty: 30 | Fill #2

## 2016-01-22 MED FILL — PANTOPRAZOLE SOD DR 40 MG T: 40 | 90 days supply | Qty: 90 | Fill #1

## 2016-02-21 MED FILL — lamoTRIgine ER 200 MG TB24: 200 | 90 days supply | Qty: 90 | Fill #0

## 2016-04-23 MED FILL — PANTOPRAZOLE SOD DR 40 MG T: 40 | 90 days supply | Qty: 90 | Fill #2

## 2016-04-23 MED FILL — SILDENAFIL 20 MG TABLET: 20 | 6 days supply | Qty: 30 | Fill #3

## 2016-05-25 MED FILL — lamoTRIgine ER 200 MG TB24: 200 | 90 days supply | Qty: 90 | Fill #1

## 2016-06-04 MED FILL — lamoTRIgine ER 25 MG TB24: 25 | 90 days supply | Qty: 180 | Fill #0

## 2016-08-07 MED FILL — PANTOPRAZOLE SOD DR 40 MG T: 40 | 90 days supply | Qty: 90 | Fill #3

## 2016-08-07 MED FILL — SILDENAFIL 20 MG TABLET: 20 | 6 days supply | Qty: 30 | Fill #4

## 2016-08-20 MED FILL — LamoTRIgine ER 200 MG TB24: 200 | 90 days supply | Qty: 90 | Fill #0

## 2016-09-02 MED FILL — lamoTRIgine ER 25 MG TB24: 25 | 90 days supply | Qty: 180 | Fill #1

## 2016-09-29 MED FILL — SILDENAFIL 20 MG TABLET: 20 | 6 days supply | Qty: 30 | Fill #5

## 2016-11-03 MED FILL — PANTOPRAZOLE SOD DR 40 MG T: 40 | 30 days supply | Qty: 30 | Fill #0

## 2016-11-17 MED FILL — LamoTRIgine ER 200 MG TB24: 200 | 90 days supply | Qty: 90 | Fill #1

## 2016-12-02 MED FILL — PANTOPRAZOLE SOD DR 40 MG T: 40 | 30 days supply | Qty: 30 | Fill #0

## 2016-12-02 MED FILL — lamoTRIgine ER 25 MG TB24: 25 | 90 days supply | Qty: 180 | Fill #2

## 2016-12-22 MED FILL — SILDENAFIL 20 MG TABLET: 20 | 6 days supply | Qty: 30 | Fill #0

## 2016-12-28 MED FILL — PANTOPRAZOLE SOD DR 40 MG T: 40 | 90 days supply | Qty: 90 | Fill #0

## 2017-02-19 MED FILL — LamoTRIgine ER 200 MG TB24: 200 | 90 days supply | Qty: 90 | Fill #2

## 2017-03-05 MED FILL — lamoTRIgine ER 25 MG TB24: 25 | 90 days supply | Qty: 180 | Fill #3

## 2017-03-05 MED FILL — SILDENAFIL 20 MG TABLET: 20 | 6 days supply | Qty: 30 | Fill #1

## 2017-03-29 MED FILL — PANTOPRAZOLE SOD DR 40 MG T: 40 | 90 days supply | Qty: 90 | Fill #1

## 2017-05-18 MED FILL — LamoTRIgine ER 200 MG TB24: 200 | 90 days supply | Qty: 90 | Fill #3

## 2017-05-20 MED FILL — SILDENAFIL 20 MG TABLET: 20 | 6 days supply | Qty: 30 | Fill #2

## 2017-06-04 MED FILL — lamoTRIgine ER 25 MG TB24: 25 | 90 days supply | Qty: 180 | Fill #0

## 2017-06-21 MED FILL — PANTOPRAZOLE SOD DR 40 MG T: 40 | 90 days supply | Qty: 90 | Fill #2

## 2017-08-18 MED FILL — SILDENAFIL 20 MG TABLET: 20 | 6 days supply | Qty: 30 | Fill #3

## 2017-08-18 MED FILL — LaMICtal XR 250 MG TB24: 250 | 30 days supply | Qty: 30 | Fill #0

## 2017-09-15 MED FILL — LaMICtal XR 250 MG TB24: 250 | 30 days supply | Qty: 30 | Fill #1

## 2017-09-15 MED FILL — PANTOPRAZOLE SOD DR 40 MG T: 40 | 90 days supply | Qty: 90 | Fill #3

## 2017-10-14 MED FILL — SILDENAFIL 20 MG TABLET: 20 | 6 days supply | Qty: 30 | Fill #4

## 2017-10-14 MED FILL — LaMICtal XR 250 MG TB24: 250 | 30 days supply | Qty: 30 | Fill #2

## 2017-11-11 MED FILL — HYDROCODON-APAP 5-325: 5-325 | 3 days supply | Qty: 30 | Fill #0

## 2017-11-17 MED FILL — LaMICtal XR 250 MG TB24: 250 | 30 days supply | Qty: 30 | Fill #3

## 2017-12-13 MED FILL — SILDENAFIL 20 MG TABLET: 20 | 6 days supply | Qty: 30 | Fill #5

## 2017-12-13 MED FILL — PANTOPRAZOLE SOD DR 40 MG T: 40 | 90 days supply | Qty: 90 | Fill #4

## 2017-12-20 MED FILL — LaMICtal XR 250 MG TB24: 250 | 30 days supply | Qty: 30 | Fill #4

## 2018-01-18 MED FILL — LaMICtal XR 250 MG TB24: 250 | 30 days supply | Qty: 30 | Fill #5

## 2018-02-15 MED FILL — SILDENAFIL 20 MG TABLET: 20 | 6 days supply | Qty: 30 | Fill #0

## 2018-02-15 MED FILL — LaMICtal XR 250 MG TB24: 250 | 30 days supply | Qty: 30 | Fill #6

## 2018-02-18 MED FILL — FLUTICASONE PROP 50 MCG SPR: 50 | 30 days supply | Qty: 16 | Fill #0

## 2018-02-18 MED FILL — BENZONATATE 100 MG CAP: 100 | 5 days supply | Qty: 15 | Fill #0

## 2018-02-18 MED FILL — MUCINEX ER 1,200 MG TABLET: 1200 | 14 days supply | Qty: 28 | Fill #0

## 2018-02-28 MED FILL — AMOXICILLIN 875 MG TABS: 875 | 10 days supply | Qty: 20 | Fill #0

## 2018-03-09 MED FILL — PANTOPRAZOLE SOD DR 40 MG T: 40 | 90 days supply | Qty: 90 | Fill #0

## 2018-03-22 MED FILL — SILDENAFIL 20 MG TABLET: 20 | 6 days supply | Qty: 30 | Fill #1

## 2018-03-22 MED FILL — LaMICtal XR 250 MG TB24: 250 | 30 days supply | Qty: 30 | Fill #7

## 2018-04-21 MED FILL — SILDENAFIL 20 MG TABLET: 20 | 6 days supply | Qty: 30 | Fill #2

## 2018-04-21 MED FILL — LaMICtal XR 250 MG TB24: 250 | 30 days supply | Qty: 30 | Fill #8

## 2018-05-23 MED FILL — LaMICtal XR 250 MG TB24: 250 | 30 days supply | Qty: 30 | Fill #9

## 2018-06-06 MED FILL — PANTOPRAZOLE SOD DR 40 MG T: 40 | 90 days supply | Qty: 90 | Fill #1

## 2018-06-06 MED FILL — SILDENAFIL CITRATE 20 MG TA: 20 | 6 days supply | Qty: 30 | Fill #0

## 2018-06-21 MED FILL — LaMICtal XR 250 MG TB24: 250 | 30 days supply | Qty: 30 | Fill #10

## 2018-07-20 MED FILL — SILDENAFIL CITRATE 20 MG TA: 20 | 6 days supply | Qty: 30 | Fill #1

## 2018-07-20 MED FILL — LaMICtal XR 250 MG TB24: 250 | 30 days supply | Qty: 30 | Fill #0

## 2018-08-17 MED FILL — metFORMIN HCL ER 500 MG TB2: 500 | 30 days supply | Qty: 60 | Fill #0

## 2018-08-19 MED FILL — SILDENAFIL CITRATE 20 MG TA: 20 | 6 days supply | Qty: 30 | Fill #2

## 2018-08-19 MED FILL — LaMICtal XR 250 MG TB24: 250 | 30 days supply | Qty: 30 | Fill #1

## 2018-09-01 MED FILL — PANTOPRAZOLE SOD DR 40 MG T: 40 | 90 days supply | Qty: 90 | Fill #2

## 2018-09-20 MED FILL — LaMICtal XR 250 MG TB24: 250 | 30 days supply | Qty: 30 | Fill #2

## 2018-10-20 MED FILL — SILDENAFIL CITRATE 20 MG TA: 20 | 6 days supply | Qty: 30 | Fill #0

## 2018-10-20 MED FILL — LaMICtal XR 250 MG TB24: 250 | 30 days supply | Qty: 30 | Fill #3

## 2018-10-20 MED FILL — metFORMIN HCL ER 500 MG TB2: 500 | 30 days supply | Qty: 60 | Fill #1

## 2018-11-21 MED FILL — LaMICtal XR 250 MG TB24: 250 | 30 days supply | Qty: 30 | Fill #4

## 2018-12-02 MED FILL — PANTOPRAZOLE SOD DR 40 MG T: 40 | 90 days supply | Qty: 90 | Fill #3

## 2018-12-20 MED FILL — LaMICtal XR 250 MG TB24: 250 | 30 days supply | Qty: 30 | Fill #5

## 2018-12-20 MED FILL — SILDENAFIL CITRATE 20 MG TA: 20 | 6 days supply | Qty: 30 | Fill #1

## 2018-12-20 MED FILL — metFORMIN HCL ER 500 MG TB2: 500 | 30 days supply | Qty: 60 | Fill #2

## 2019-01-19 MED FILL — LaMICtal XR 250 MG TB24: 250 | 30 days supply | Qty: 30 | Fill #6

## 2019-02-20 MED FILL — PANTOPRAZOLE SOD DR 40 MG T: 40 | 90 days supply | Qty: 90 | Fill #0

## 2019-02-20 MED FILL — LaMICtal XR 250 MG TB24: 250 | 30 days supply | Qty: 30 | Fill #7

## 2019-02-20 MED FILL — SILDENAFIL CITRATE 20 MG TA: 20 | 6 days supply | Qty: 30 | Fill #0

## 2019-03-21 MED FILL — LaMICtal XR 250 MG TB24: 250 | 30 days supply | Qty: 30 | Fill #8

## 2019-03-21 MED FILL — SILDENAFIL CITRATE 20 MG TA: 20 | 6 days supply | Qty: 30 | Fill #1

## 2019-03-22 MED FILL — MELOXICAM 15 MG TABLET: 15 | 30 days supply | Qty: 30 | Fill #0

## 2019-03-22 MED FILL — TERBINAFINE HCL 250 MG TAB: 250 | 30 days supply | Qty: 30 | Fill #0

## 2019-04-19 MED FILL — LaMICtal XR 250 MG TB24: 250 | 30 days supply | Qty: 30 | Fill #9

## 2019-04-19 MED FILL — MELOXICAM 15 MG TABLET: 15 | 30 days supply | Qty: 30 | Fill #1

## 2019-04-19 MED FILL — TERBINAFINE HCL 250 MG TAB: 250 | 30 days supply | Qty: 30 | Fill #1

## 2019-04-19 MED FILL — SILDENAFIL CITRATE 20 MG TA: 20 | 6 days supply | Qty: 30 | Fill #2

## 2019-05-17 MED FILL — LaMICtal XR 250 MG TB24: 250 | 30 days supply | Qty: 30 | Fill #0

## 2019-05-18 MED FILL — PANTOPRAZOLE SOD DR 40 MG T: 40 | 90 days supply | Qty: 90 | Fill #1

## 2019-05-18 MED FILL — SILDENAFIL CITRATE 20 MG TA: 20 | 6 days supply | Qty: 30 | Fill #0

## 2019-05-18 MED FILL — MELOXICAM 15 MG TABLET: 15 | 30 days supply | Qty: 30 | Fill #2

## 2019-05-18 MED FILL — TERBINAFINE HCL 250 MG TAB: 250 | 30 days supply | Qty: 30 | Fill #2

## 2019-06-13 MED FILL — PIOGLITAZONE HCL 15 MG TABS: 15 | 30 days supply | Qty: 30 | Fill #0

## 2019-06-16 MED FILL — LaMICtal XR 250 MG TB24: 250 | 30 days supply | Qty: 30 | Fill #1

## 2019-06-29 MED FILL — SILDENAFIL CITRATE 20 MG TA: 20 | 6 days supply | Qty: 30 | Fill #1

## 2019-06-29 MED FILL — MELOXICAM 15 MG TABLET: 15 | 30 days supply | Qty: 30 | Fill #0

## 2019-07-18 MED FILL — PIOGLITAZONE HCL 15 MG TABS: 15 | 30 days supply | Qty: 30 | Fill #1

## 2019-07-18 MED FILL — LaMICtal XR 250 MG TB24: 250 | 30 days supply | Qty: 30 | Fill #2

## 2019-08-16 MED FILL — PIOGLITAZONE HCL 15 MG TAB: 15 | 30 days supply | Qty: 30 | Fill #2

## 2019-08-16 MED FILL — LaMICtal XR 250 MG TB24: 250 | 30 days supply | Qty: 30 | Fill #3

## 2019-08-16 MED FILL — SILDENAFIL CITRATE 20 MG TA: 20 | 6 days supply | Qty: 30 | Fill #2

## 2019-08-16 MED FILL — PANTOPRAZOLE SOD DR 40 MG T: 40 | 90 days supply | Qty: 90 | Fill #0

## 2019-08-22 MED FILL — SILDENAFIL CITRATE 20 MG TA: 20 | 6 days supply | Qty: 30 | Fill #0

## 2019-09-12 MED FILL — LaMICtal XR 250 MG TB24: 250 | 30 days supply | Qty: 30 | Fill #4

## 2019-09-12 MED FILL — PIOGLITAZONE HCL 15 MG TAB: 15 | 30 days supply | Qty: 30 | Fill #3

## 2019-09-13 MED FILL — SILDENAFIL CITRATE 20 MG TA: 20 | 6 days supply | Qty: 30 | Fill #0

## 2019-10-09 MED FILL — PIOGLITAZONE HCL 15 MG TAB: 15 | 30 days supply | Qty: 30 | Fill #4

## 2019-10-09 MED FILL — LaMICtal XR 250 MG TB24: 250 | 30 days supply | Qty: 30 | Fill #5

## 2019-10-17 MED FILL — LaMICtal XR 250 MG TB24: 250 | 30 days supply | Qty: 30 | Fill #5

## 2019-10-17 MED FILL — PIOGLITAZONE HCL 15 MG TAB: 15 | 30 days supply | Qty: 30 | Fill #4

## 2019-11-09 MED FILL — PANTOPRAZOLE SOD DR 40 MG T: 40 | 90 days supply | Qty: 90 | Fill #0

## 2019-11-10 MED FILL — SILDENAFIL CITRATE 20 MG TA: 20 | 6 days supply | Qty: 30 | Fill #1

## 2019-11-13 MED FILL — lamoTRIgine ER 250 MG TB24: 250 | 30 days supply | Qty: 30 | Fill #0

## 2019-11-14 MED FILL — PIOGLITAZONE HCL 15 MG TAB: 15 | 30 days supply | Qty: 30 | Fill #5

## 2019-12-08 MED FILL — lamoTRIgine ER 250 MG TB24: 250 | 30 days supply | Qty: 30 | Fill #1

## 2019-12-12 MED FILL — SILDENAFIL CITRATE 20 MG TA: 20 | 6 days supply | Qty: 30 | Fill #2

## 2019-12-12 MED FILL — PIOGLITAZONE HCL 15 MG TAB: 15 | 30 days supply | Qty: 30 | Fill #6

## 2020-01-15 MED FILL — lamoTRIgine ER 250 MG TB24: 250 | 30 days supply | Qty: 30 | Fill #2

## 2020-01-15 MED FILL — PIOGLITAZONE HCL 15 MG TAB: 15 | 30 days supply | Qty: 30 | Fill #7

## 2020-01-15 MED FILL — SILDENAFIL 20 MG TABLET: 20 | 6 days supply | Qty: 30 | Fill #0

## 2020-02-20 MED FILL — SILDENAFIL CITRATE 20 MG TA: 20 | 6 days supply | Qty: 30 | Fill #1

## 2020-02-20 MED FILL — lamoTRIgine ER 250 MG TB24: 250 | 30 days supply | Qty: 30 | Fill #3

## 2020-02-20 MED FILL — PIOGLITAZONE HCL 15 MG TAB: 15 | 30 days supply | Qty: 30 | Fill #8

## 2020-02-21 MED FILL — PANTOPRAZOLE SOD DR 40 MG T: 40 | 90 days supply | Qty: 90 | Fill #0

## 2020-02-22 MED FILL — HYDROCODONE-HOMATROPINE SYR: 5-1.5 | 5 days supply | Qty: 100 | Fill #0

## 2020-02-22 MED FILL — BENZONATATE 200 MG CAPS: 200 | 14 days supply | Qty: 42 | Fill #0

## 2020-03-20 MED FILL — SILDENAFIL CITRATE 20 MG TA: 20 | 6 days supply | Qty: 30 | Fill #2

## 2020-03-20 MED FILL — lamoTRIgine ER 250 MG TB24: 250 | 30 days supply | Qty: 30 | Fill #4

## 2020-03-20 MED FILL — PIOGLITAZONE HCL 15 MG TAB: 15 | 30 days supply | Qty: 30 | Fill #9

## 2020-04-22 MED FILL — lamoTRIgine ER 250 MG TB24: 250 | 30 days supply | Qty: 30 | Fill #5

## 2020-04-22 MED FILL — PIOGLITAZONE HCL 15 MG TAB: 15 | 30 days supply | Qty: 30 | Fill #10

## 2020-04-23 MED FILL — SILDENAFIL CITRATE 20 MG TA: 20 | 6 days supply | Qty: 30 | Fill #3

## 2020-05-20 MED FILL — PIOGLITAZONE HCL 15 MG TAB: 15 | 30 days supply | Qty: 30 | Fill #11

## 2020-05-20 MED FILL — lamoTRIgine ER 250 MG TB24: 250 | 30 days supply | Qty: 30 | Fill #6

## 2020-05-20 MED FILL — SILDENAFIL CITRATE 20 MG TA: 20 | 6 days supply | Qty: 30 | Fill #4

## 2020-05-20 MED FILL — PANTOPRAZOLE SOD DR 40 MG T: 40 | 90 days supply | Qty: 90 | Fill #0

## 2020-06-17 ENCOUNTER — Other Ambulatory Visit (HOSPITAL_BASED_OUTPATIENT_CLINIC_OR_DEPARTMENT_OTHER): Payer: Self-pay | Admitting: Family Medicine

## 2020-06-17 MED FILL — SILDENAFIL CITRATE 20 MG TA: 20 | 6 days supply | Qty: 30 | Fill #0

## 2020-06-17 MED FILL — PIOGLITAZONE HCL 45 MG TAB: 45 | 90 days supply | Qty: 90 | Fill #0

## 2020-06-21 MED FILL — lamoTRIgine ER 250 MG TB24: 250 | 30 days supply | Qty: 30 | Fill #7

## 2020-07-22 MED FILL — SILDENAFIL 20 MG TABLET: 20 | 6 days supply | Qty: 30 | Fill #1

## 2020-07-22 MED FILL — lamoTRIgine ER 250 MG TB24: 250 | 30 days supply | Qty: 30 | Fill #8

## 2020-08-19 MED FILL — lamoTRIgine ER 250 MG TB24: 250 | 30 days supply | Qty: 30 | Fill #9

## 2020-08-19 MED FILL — SILDENAFIL 20 MG TABLET: 20 | 6 days supply | Qty: 30 | Fill #2

## 2020-08-19 MED FILL — PANTOPRAZOLE SOD DR 40 MG T: 40 | 90 days supply | Qty: 90 | Fill #0

## 2020-09-20 MED FILL — PIOGLITAZONE HCL 45 MG TAB: 45 | 90 days supply | Qty: 90 | Fill #1

## 2020-09-23 MED FILL — lamoTRIgine ER 250 MG TB24: 250 | 30 days supply | Qty: 30 | Fill #0

## 2020-09-24 MED FILL — SILDENAFIL 20 MG TABLET: 20 | 6 days supply | Qty: 30 | Fill #3

## 2020-10-14 ENCOUNTER — Other Ambulatory Visit (HOSPITAL_BASED_OUTPATIENT_CLINIC_OR_DEPARTMENT_OTHER): Payer: Self-pay | Admitting: Family Medicine

## 2020-10-14 MED FILL — PROMETHAZINE W/DM SYRUP: 6.25-15 | 7 days supply | Qty: 140 | Fill #0

## 2020-10-14 MED FILL — AZITHROMYCIN 250 MG TABLET: 250 | 5 days supply | Qty: 6 | Fill #0

## 2020-10-22 MED FILL — SILDENAFIL CITRATE 20 MG TA: 20 | 6 days supply | Qty: 30 | Fill #4

## 2020-10-22 MED FILL — lamoTRIgine ER 250 MG TB24: 250 | 30 days supply | Qty: 30 | Fill #1

## 2020-11-18 MED FILL — lamoTRIgine ER 250 MG TB24: 250 | 30 days supply | Qty: 30 | Fill #2

## 2020-11-18 MED FILL — PANTOPRAZOLE SOD DR 40 MG T: 40 | 90 days supply | Qty: 90 | Fill #1

## 2020-11-20 MED FILL — SILDENAFIL CITRATE 20 MG TA: 20 | 6 days supply | Qty: 30 | Fill #0

## 2022-07-03 ENCOUNTER — Other Ambulatory Visit: Payer: Self-pay | Admitting: Family Medicine

## 2022-07-03 ENCOUNTER — Other Ambulatory Visit (HOSPITAL_BASED_OUTPATIENT_CLINIC_OR_DEPARTMENT_OTHER): Payer: Self-pay | Admitting: Family Medicine

## 2022-07-03 DIAGNOSIS — Z0000 Encounter for general adult medical examination without abnormal findings: Secondary | ICD-10-CM

## 2022-07-13 ENCOUNTER — Ambulatory Visit (HOSPITAL_BASED_OUTPATIENT_CLINIC_OR_DEPARTMENT_OTHER)
Admission: RE | Admit: 2022-07-13 | Discharge: 2022-07-13 | Disposition: A | Discharge status: Home / Self Care | Payer: 59 | Source: Ambulatory Visit | Attending: Family Medicine | Admitting: Family Medicine

## 2022-07-13 DIAGNOSIS — Z0000 Encounter for general adult medical examination without abnormal findings: Secondary | ICD-10-CM | POA: Insufficient documentation

## 2022-10-29 ENCOUNTER — Encounter: Payer: Self-pay | Admitting: Family Medicine

## 2024-02-11 ENCOUNTER — Other Ambulatory Visit (HOSPITAL_BASED_OUTPATIENT_CLINIC_OR_DEPARTMENT_OTHER): Payer: Self-pay

## 2024-02-11 ENCOUNTER — Emergency Department (HOSPITAL_BASED_OUTPATIENT_CLINIC_OR_DEPARTMENT_OTHER)

## 2024-02-11 ENCOUNTER — Emergency Department (HOSPITAL_BASED_OUTPATIENT_CLINIC_OR_DEPARTMENT_OTHER)
Admission: EM | Admit: 2024-02-11 | Discharge: 2024-02-11 | Disposition: A | Discharge status: Home / Self Care | Source: Home / Self Care | Attending: Emergency Medicine | Admitting: Emergency Medicine

## 2024-02-11 ENCOUNTER — Other Ambulatory Visit: Payer: Self-pay

## 2024-02-11 ENCOUNTER — Encounter (HOSPITAL_BASED_OUTPATIENT_CLINIC_OR_DEPARTMENT_OTHER): Payer: Self-pay | Admitting: Emergency Medicine

## 2024-02-11 DIAGNOSIS — L03116 Cellulitis of left lower limb: Secondary | ICD-10-CM | POA: Insufficient documentation

## 2024-02-11 DIAGNOSIS — E119 Type 2 diabetes mellitus without complications: Secondary | ICD-10-CM | POA: Insufficient documentation

## 2024-02-11 DIAGNOSIS — Z7984 Long term (current) use of oral hypoglycemic drugs: Secondary | ICD-10-CM | POA: Insufficient documentation

## 2024-02-11 MED ORDER — CEFUROXIME AXETIL 250 MG PO TABS
500.0000 mg | ORAL_TABLET | Freq: Two times a day (BID) | ORAL | Status: AC
  Administered 2024-02-11: 500 mg via ORAL
  Filled 2024-02-11: qty 2

## 2024-02-11 MED ORDER — CEFADROXIL 500 MG PO CAPS
500.0000 mg | ORAL_CAPSULE | Freq: Two times a day (BID) | ORAL | 0 refills | Status: DC
  Filled 2024-02-11: qty 14, 7d supply, fill #0

## 2024-02-11 MED ORDER — BACITRACIN ZINC 500 UNIT/GM EX OINT
TOPICAL_OINTMENT | Freq: Once | CUTANEOUS | Status: AC
  Administered 2024-02-11: 31.5 via TOPICAL
  Filled 2024-02-11: qty 28.35

## 2024-02-11 MED ORDER — DOXYCYCLINE HYCLATE 100 MG PO TABS
100.0000 mg | ORAL_TABLET | Freq: Once | ORAL | Status: AC
  Administered 2024-02-11: 100 mg via ORAL
  Filled 2024-02-11: qty 1

## 2024-02-11 MED ORDER — CEFADROXIL 500 MG PO CAPS: 500.0000 mg | ORAL_CAPSULE | Freq: Once | ORAL | Status: DC

## 2024-02-11 MED ORDER — CHLORHEXIDINE GLUCONATE 4 % EX SOLN
Freq: Every day | CUTANEOUS | 0 refills | Status: DC | PRN
  Filled 2024-02-11: qty 236, 30d supply, fill #0

## 2024-02-11 MED ORDER — MELOXICAM 15 MG PO TABS
15.0000 mg | ORAL_TABLET | Freq: Every day | ORAL | 0 refills | Status: DC | PRN
  Filled 2024-02-11: qty 30, 30d supply, fill #0

## 2024-02-11 MED ORDER — BACITRACIN 500 UNIT/GM EX OINT
1.0000 | TOPICAL_OINTMENT | Freq: Two times a day (BID) | CUTANEOUS | 0 refills | Status: DC
  Filled 2024-02-11: qty 28, 30d supply, fill #0

## 2024-02-11 MED ORDER — DOXYCYCLINE HYCLATE 100 MG PO CAPS
100.0000 mg | ORAL_CAPSULE | Freq: Two times a day (BID) | ORAL | 0 refills | Status: DC
  Filled 2024-02-11: qty 14, 7d supply, fill #0

## 2024-02-11 NOTE — ED Provider Notes (Signed)
 [facility] Provider Note   CSN: [id number] Arrival date & time: 02/11/24  1403     History  Chief Complaint  Patient presents with   Extremity Laceration    Joshua Conner is a 53 y.o. male.  HPI   53 year old male presents emergency department complaints of left lower extremity pain, redness.  States that 8 days ago, had a piece of Granite countertop fall on his left lower leg.  Reports having a cut on the front of his shin.  Since then, has been walking around on leg.  Reports over the past few days, has noticed increased swelling, redness, pain.  States it is warm to the touch.  Denies any systemic fever, chills.  Presents emergency department for further assessment.  Past medical history significant for seizure, acid reflux, diabetes mellitus  Home Medications Prior to Admission medications   Medication Sig Start Date End Date Taking? Authorizing Provider  bacitracin  500 UNIT/GM ointment Apply 1 Application topically 2 (two) times daily. 02/11/24  Yes Silver Fell A, PA  cefadroxil  (DURICEF) 500 MG capsule Take 1 capsule (500 mg total) by mouth 2 (two) times daily. 02/11/24  Yes Silver Fell A, PA  chlorhexidine  (HIBICLENS ) 4 % external liquid Apply topically daily as needed. 02/11/24  Yes Silver Fell A, PA  doxycycline  (VIBRAMYCIN ) 100 MG capsule Take 1 capsule (100 mg total) by mouth 2 (two) times daily. 02/11/24  Yes Silver Fell A, PA  meloxicam  (MOBIC ) 15 MG tablet Take 1 tablet (15 mg total) by mouth daily as needed. 02/11/24  Yes Silver Fell A, PA  ibuprofen  (ADVIL ,MOTRIN ) 600 MG tablet Take 1 tablet (600 mg total) by mouth every 6 (six) hours as needed. Patient not taking: Reported on 01/29/2015 03/12/14   Midge Golas, MD  LamoTRIgine  XR 200 MG TB24 Take 200 mg by mouth daily.     [provider]  LORazepam  (ATIVAN ) 1 MG tablet Take 1 tablet (1 mg total) by mouth 2 (two) times daily. Patient not  taking: Reported on 01/29/2015 03/12/14   Midge Golas, MD  oxyCODONE -acetaminophen  (PERCOCET) 7.5-325 MG per tablet Take 1 tablet by mouth every 4 (four) hours as needed for pain. 02/04/15   Rubin Calamity, MD  oxyCODONE -acetaminophen  (PERCOCET/ROXICET) 5-325 MG per tablet Take 1 tablet by mouth every 6 (six) hours as needed for severe pain. 01/29/15   Lawyer, Lonni, PA-C  pantoprazole  (PROTONIX ) 40 MG tablet Take 40 mg by mouth daily.    [provider]  Soft Lens Products (CLEAR EYES CONTACT LENS RELIEF) SOLN Place 1 drop into both eyes as needed (for dry eyes). For rewetting contact lenses.    [provider]      Allergies    Patient has no known allergies.    Review of Systems   Review of Systems  All other systems reviewed and are negative.   Physical Exam Updated Vital Signs BP (!) 142/94 (BP Location: Right Arm)   Pulse 91   Temp 98.2 F (36.8 C)   Resp 18   Ht 6' 1 (1.854 m)   Wt 128 kg   SpO2 98%   BMI 37.23 kg/m  Physical Exam Vitals and nursing note reviewed.  Constitutional:      General: He is not in acute distress.    Appearance: He is well-developed.  HENT:     Head: Normocephalic and atraumatic.  Eyes:     Conjunctiva/sclera: Conjunctivae normal.  Cardiovascular:     Rate  and Rhythm: Normal rate and regular rhythm.     Heart sounds: No murmur heard. Pulmonary:     Effort: Pulmonary effort is normal. No respiratory distress.     Breath sounds: Normal breath sounds.  Abdominal:     Palpations: Abdomen is soft.     Tenderness: There is no abdominal tenderness.  Musculoskeletal:        General: No swelling.     Cervical back: Neck supple.     Comments: Full range of motion of left knee, ankle and digits.  Wound appreciated left anterior leg measuring 12 cm in length.  Surrounding erythema with serous/yellow, drainage..  She has been stretching and to palpation.  Pedal and posterior tibial pulses 2+ bilaterally.  Slight lower  extremity edema 1+ on the left side.  No obvious bony tenderness on exam..  Skin:    General: Skin is warm and dry.     Capillary Refill: Capillary refill takes less than 2 seconds.  Neurological:     Mental Status: He is alert.  Psychiatric:        Mood and Affect: Mood normal.     ED Results / Procedures / Treatments   Labs (all labs ordered are listed, but only abnormal results are displayed) Labs Reviewed - No data to display  EKG None  Radiology No results found.  Procedures Procedures    Medications Ordered in ED Medications  doxycycline  (VIBRA -TABS) tablet 100 mg (100 mg Oral Given 02/11/24 1501)  bacitracin  ointment (31.5 Applications Topical Given 02/11/24 1501)  cefUROXime  (CEFTIN ) tablet 500 mg (500 mg Oral Given 02/11/24 1501)    ED Course/ Medical Decision Making/ A&P                                 Medical Decision Making Amount and/or Complexity of Data Reviewed Radiology: ordered.  Risk OTC drugs. Prescription drug management.   This patient presents to the ED for concern of left leg pain, this involves an extensive number of treatment options, and is a complaint that carries with it a high risk of complications and morbidity.  The differential diagnosis includes fracture, strain/pain, dislocation, ligament/tendon injury, neurovascular compromise, cellulitis, erysipelas, abscess, necrotizing infection, ischemic limb, DVT, other   Co morbidities that complicate the patient evaluation  See HPI   Additional history obtained:  Additional history obtained from EMR External records from outside source obtained and reviewed including hospital records   Lab Tests:  N/a   Imaging Studies ordered:  I ordered imaging studies including left tibia/fibula x-ray  I independently visualized and interpreted imaging which showed no acute osseous abnormality.  Soft tissue swelling. I agree with the radiologist interpretation   Cardiac Monitoring: /  EKG:  The patient was maintained on a cardiac monitor.  I personally viewed and interpreted the cardiac monitored which showed an underlying rhythm of: sinus rhythm   Consultations Obtained:  N/a   Problem List / ED Course / Critical interventions / Medication management  Cellulitis I ordered medication including bacitracin , cefuroxime , doxycycline    Reevaluation of the patient after these medicines showed that the patient improved I have reviewed the patients home medicines and have made adjustments as needed   Social Determinants of Health:  Denies tobacco, licit drug use.   Test / Admission - Considered:  Cellulitis Vitals signs significant for hypertension and blood pressure 142/94. Otherwise within normal range and stable throughout visit. Imaging studies significant for:  See above 53 year old male presents emergency department complaints of left lower extremity pain, redness.  States that 8 days ago, had a piece of Granite countertop fall on his left lower leg.  Reports having a cut on the front of his shin.  Since then, has been walking around on leg.  Reports over the past few days, has noticed increased swelling, redness, pain.  States it is warm to the touch.  Denies any systemic fever, chills.  Presents emergency department for further assessment. On exam, shallow ulcerative wound appreciated left anterior lower leg.  Surrounding cellulitic skin changes.  No pulse deficits suggest ischemic limb.  No obvious palpable crepitus concerning for necrotizing infection.  Compartments soft and supple; low suspicion for compartment syndrome.  Patient without vital signs concerning for meeting of SIRS criteria so sepsis critical illness not followed.  X-ray obtained which did not show any acute osseous abnormality.  Will recommend local wound care at home, treatment of cellulitis with oral antibiotics.  Given patient's history of diabetes with ulcerative wound present, will make referral  for wound clinic.  Treatment plan discussed with patient and he acknowledged understanding was agreeable to plan.  Patient overall well-appearing, afebrile in no distress. Worrisome signs and symptoms were discussed with the patient, and the patient acknowledged understanding to return to the ED if noticed. Patient was stable upon discharge.          Final Clinical Impression(s) / ED Diagnoses Final diagnoses:  Cellulitis of left lower extremity    Rx / DC Orders      Silver Wonda LABOR, [place] 02/11/24 1609    Tegeler, Lonni PARAS, MD 02/14/24 [phone]

## 2024-02-11 NOTE — ED Triage Notes (Addendum)
 Laceration to left lower leg had  800 lb piece of garnite fall  and hit leg last week  lower leg is swollen

## 2024-02-11 NOTE — ED Notes (Signed)
 Discharge instructions reviewed with patient. Patient verbalizes understanding, no further questions at this time. Medications/prescriptions and follow up information provided. No acute distress noted at time of departure.

## 2024-02-11 NOTE — Discharge Instructions (Addendum)
 As discussed, will place on antibiotics given concern for skin infection.  Will also recommend washing the area with warm soapy water or using the Hibiclens  solution.  Will send an antibiotic ointment as well.  Recommend follow-up with your primary care/wound clinic.  Will send in a referral for wound clinic for reevaluation but you may be seen quicker by schedule an appointment with your primary care for reevaluation.  Return if you develop worsening redness, fever, chills, worsening pain.

## 2024-02-13 ENCOUNTER — Encounter (HOSPITAL_BASED_OUTPATIENT_CLINIC_OR_DEPARTMENT_OTHER): Payer: Self-pay | Admitting: Emergency Medicine

## 2024-02-13 ENCOUNTER — Other Ambulatory Visit: Payer: Self-pay

## 2024-02-13 DIAGNOSIS — Z8249 Family history of ischemic heart disease and other diseases of the circulatory system: Secondary | ICD-10-CM

## 2024-02-13 DIAGNOSIS — Z79899 Other long term (current) drug therapy: Secondary | ICD-10-CM

## 2024-02-13 DIAGNOSIS — N179 Acute kidney failure, unspecified: Secondary | ICD-10-CM | POA: Diagnosis present

## 2024-02-13 DIAGNOSIS — G4089 Other seizures: Secondary | ICD-10-CM | POA: Diagnosis present

## 2024-02-13 DIAGNOSIS — E785 Hyperlipidemia, unspecified: Secondary | ICD-10-CM | POA: Diagnosis present

## 2024-02-13 DIAGNOSIS — K219 Gastro-esophageal reflux disease without esophagitis: Secondary | ICD-10-CM | POA: Diagnosis present

## 2024-02-13 DIAGNOSIS — Z6841 Body Mass Index (BMI) 40.0 and over, adult: Secondary | ICD-10-CM

## 2024-02-13 DIAGNOSIS — Z825 Family history of asthma and other chronic lower respiratory diseases: Secondary | ICD-10-CM

## 2024-02-13 DIAGNOSIS — E66813 Obesity, class 3: Secondary | ICD-10-CM | POA: Diagnosis present

## 2024-02-13 DIAGNOSIS — Z791 Long term (current) use of non-steroidal anti-inflammatories (NSAID): Secondary | ICD-10-CM

## 2024-02-13 DIAGNOSIS — E119 Type 2 diabetes mellitus without complications: Secondary | ICD-10-CM | POA: Diagnosis present

## 2024-02-13 DIAGNOSIS — L03116 Cellulitis of left lower limb: Principal | ICD-10-CM | POA: Diagnosis present

## 2024-02-13 LAB — CBC WITH DIFFERENTIAL/PLATELET
Abs Immature Granulocytes: 0.05 10*3/uL (ref 0.00–0.07)
Basophils Absolute: 0.1 10*3/uL (ref 0.0–0.1)
Basophils Relative: 1 %
Eosinophils Absolute: 0 10*3/uL (ref 0.0–0.5)
Eosinophils Relative: 0 %
HCT: 44.4 % (ref 39.0–52.0)
Hemoglobin: 14.8 g/dL (ref 13.0–17.0)
Immature Granulocytes: 0 %
Lymphocytes Relative: 13 %
Lymphs Abs: 1.5 10*3/uL (ref 0.7–4.0)
MCH: 30 pg (ref 26.0–34.0)
MCHC: 33.3 g/dL (ref 30.0–36.0)
MCV: 90.1 fL (ref 80.0–100.0)
Monocytes Absolute: 0.8 10*3/uL (ref 0.1–1.0)
Monocytes Relative: 7 %
Neutro Abs: 9.5 10*3/uL — ABNORMAL HIGH (ref 1.7–7.7)
Neutrophils Relative %: 79 %
Platelets: 389 10*3/uL (ref 150–400)
RBC: 4.93 MIL/uL (ref 4.22–5.81)
RDW: 13.3 % (ref 11.5–15.5)
WBC: 12 10*3/uL — ABNORMAL HIGH (ref 4.0–10.5)
nRBC: 0 % (ref 0.0–0.2)

## 2024-02-13 LAB — COMPREHENSIVE METABOLIC PANEL WITH GFR
ALT: 24 U/L (ref 0–44)
AST: 17 U/L (ref 15–41)
Albumin: 4 g/dL (ref 3.5–5.0)
Alkaline Phosphatase: 74 U/L (ref 38–126)
Anion gap: 10 (ref 5–15)
BUN: 23 mg/dL — ABNORMAL HIGH (ref 6–20)
CO2: 26 mmol/L (ref 22–32)
Calcium: 9 mg/dL (ref 8.9–10.3)
Chloride: 102 mmol/L (ref 98–111)
Creatinine, Ser: 1.56 mg/dL — ABNORMAL HIGH (ref 0.61–1.24)
GFR, Estimated: 53 mL/min — ABNORMAL LOW (ref >60)
Glucose, Bld: 157 mg/dL — ABNORMAL HIGH (ref 70–99)
Potassium: 4.1 mmol/L (ref 3.5–5.1)
Sodium: 138 mmol/L (ref 135–145)
Total Bilirubin: 0.9 mg/dL (ref 0.0–1.2)
Total Protein: 7.5 g/dL (ref 6.5–8.1)

## 2024-02-13 NOTE — ED Triage Notes (Signed)
 Pt was seen here 4/18 for wound to LLE s/p piece of granite falling on it; sts the pain and swelling are worse now

## 2024-02-14 ENCOUNTER — Inpatient Hospital Stay (HOSPITAL_BASED_OUTPATIENT_CLINIC_OR_DEPARTMENT_OTHER)
Admission: EM | Admit: 2024-02-14 | Discharge: 2024-02-18 | DRG: 603 | Disposition: A | Discharge status: Home / Self Care | Attending: Family Medicine | Admitting: Family Medicine

## 2024-02-14 DIAGNOSIS — G4089 Other seizures: Secondary | ICD-10-CM | POA: Diagnosis present

## 2024-02-14 DIAGNOSIS — Z79899 Other long term (current) drug therapy: Secondary | ICD-10-CM | POA: Diagnosis not present

## 2024-02-14 DIAGNOSIS — E119 Type 2 diabetes mellitus without complications: Secondary | ICD-10-CM | POA: Diagnosis present

## 2024-02-14 DIAGNOSIS — E66813 Obesity, class 3: Secondary | ICD-10-CM | POA: Diagnosis present

## 2024-02-14 DIAGNOSIS — Z8249 Family history of ischemic heart disease and other diseases of the circulatory system: Secondary | ICD-10-CM | POA: Diagnosis not present

## 2024-02-14 DIAGNOSIS — Z6841 Body Mass Index (BMI) 40.0 and over, adult: Secondary | ICD-10-CM | POA: Diagnosis not present

## 2024-02-14 DIAGNOSIS — L03116 Cellulitis of left lower limb: Principal | ICD-10-CM | POA: Diagnosis present

## 2024-02-14 DIAGNOSIS — K219 Gastro-esophageal reflux disease without esophagitis: Secondary | ICD-10-CM | POA: Diagnosis present

## 2024-02-14 DIAGNOSIS — S81802A Unspecified open wound, left lower leg, initial encounter: Secondary | ICD-10-CM

## 2024-02-14 DIAGNOSIS — E785 Hyperlipidemia, unspecified: Secondary | ICD-10-CM | POA: Insufficient documentation

## 2024-02-14 DIAGNOSIS — N179 Acute kidney failure, unspecified: Secondary | ICD-10-CM | POA: Diagnosis present

## 2024-02-14 DIAGNOSIS — Z825 Family history of asthma and other chronic lower respiratory diseases: Secondary | ICD-10-CM | POA: Diagnosis not present

## 2024-02-14 DIAGNOSIS — Z791 Long term (current) use of non-steroidal anti-inflammatories (NSAID): Secondary | ICD-10-CM | POA: Diagnosis not present

## 2024-02-14 LAB — COMPREHENSIVE METABOLIC PANEL WITH GFR
ALT: 22 U/L (ref 0–44)
AST: 16 U/L (ref 15–41)
Albumin: 3.9 g/dL (ref 3.5–5.0)
Alkaline Phosphatase: 65 U/L (ref 38–126)
Anion gap: 9 (ref 5–15)
BUN: 23 mg/dL — ABNORMAL HIGH (ref 6–20)
CO2: 25 mmol/L (ref 22–32)
Calcium: 8.9 mg/dL (ref 8.9–10.3)
Chloride: 103 mmol/L (ref 98–111)
Creatinine, Ser: 1.26 mg/dL — ABNORMAL HIGH (ref 0.61–1.24)
GFR, Estimated: >60 mL/min (ref >60)
Glucose, Bld: 122 mg/dL — ABNORMAL HIGH (ref 70–99)
Potassium: 3.7 mmol/L (ref 3.5–5.1)
Sodium: 137 mmol/L (ref 135–145)
Total Bilirubin: 0.9 mg/dL (ref 0.0–1.2)
Total Protein: 7.1 g/dL (ref 6.5–8.1)

## 2024-02-14 LAB — CBC WITH DIFFERENTIAL/PLATELET
Abs Immature Granulocytes: 0.03 10*3/uL (ref 0.00–0.07)
Basophils Absolute: 0.1 10*3/uL (ref 0.0–0.1)
Basophils Relative: 1 %
Eosinophils Absolute: 0.1 10*3/uL (ref 0.0–0.5)
Eosinophils Relative: 1 %
HCT: 45.4 % (ref 39.0–52.0)
Hemoglobin: 14.3 g/dL (ref 13.0–17.0)
Immature Granulocytes: 0 %
Lymphocytes Relative: 20 %
Lymphs Abs: 1.6 10*3/uL (ref 0.7–4.0)
MCH: 29.8 pg (ref 26.0–34.0)
MCHC: 31.5 g/dL (ref 30.0–36.0)
MCV: 94.6 fL (ref 80.0–100.0)
Monocytes Absolute: 0.7 10*3/uL (ref 0.1–1.0)
Monocytes Relative: 8 %
Neutro Abs: 5.8 10*3/uL (ref 1.7–7.7)
Neutrophils Relative %: 70 %
Platelets: 350 10*3/uL (ref 150–400)
RBC: 4.8 MIL/uL (ref 4.22–5.81)
RDW: 13.2 % (ref 11.5–15.5)
WBC: 8.2 10*3/uL (ref 4.0–10.5)
nRBC: 0 % (ref 0.0–0.2)

## 2024-02-14 LAB — GLUCOSE, CAPILLARY
Glucose-Capillary: 107 mg/dL — ABNORMAL HIGH (ref 70–99)
Glucose-Capillary: 137 mg/dL — ABNORMAL HIGH (ref 70–99)

## 2024-02-14 LAB — MAGNESIUM: Magnesium: 2.2 mg/dL (ref 1.7–2.4)

## 2024-02-14 MED ORDER — SODIUM CHLORIDE 0.9 % IV SOLN
2.0000 g | INTRAVENOUS | Status: DC
  Administered 2024-02-14 – 2024-02-15 (×2): 2 g via INTRAVENOUS
  Filled 2024-02-14 (×2): qty 20

## 2024-02-14 MED ORDER — SODIUM CHLORIDE 0.9 % IV SOLN
2.0000 g | Freq: Once | INTRAVENOUS | Status: AC
  Administered 2024-02-14: 2 g via INTRAVENOUS
  Filled 2024-02-14: qty 20

## 2024-02-14 MED ORDER — SENNOSIDES-DOCUSATE SODIUM 8.6-50 MG PO TABS: 1.0000 | ORAL_TABLET | Freq: Every evening | ORAL | Status: DC | PRN

## 2024-02-14 MED ORDER — ACETAMINOPHEN 325 MG PO TABS
650.0000 mg | ORAL_TABLET | Freq: Four times a day (QID) | ORAL | Status: DC | PRN
  Administered 2024-02-15: 650 mg via ORAL
  Filled 2024-02-14: qty 2

## 2024-02-14 MED ORDER — LAMOTRIGINE ER 250 MG PO TB24: 1.0000 | ORAL_TABLET | Freq: Every day | ORAL | Status: DC

## 2024-02-14 MED ORDER — SODIUM CHLORIDE 0.9 % IV SOLN
INTRAVENOUS | Status: AC
  Administered 2024-02-14: 1000 mL via INTRAVENOUS

## 2024-02-14 MED ORDER — PANTOPRAZOLE SODIUM 40 MG PO TBEC
40.0000 mg | DELAYED_RELEASE_TABLET | Freq: Every day | ORAL | Status: DC
  Administered 2024-02-14 – 2024-02-18 (×5): 40 mg via ORAL
  Filled 2024-02-14 (×5): qty 1

## 2024-02-14 MED ORDER — ONDANSETRON HCL 4 MG PO TABS: 4.0000 mg | ORAL_TABLET | Freq: Four times a day (QID) | ORAL | Status: DC | PRN

## 2024-02-14 MED ORDER — KETOROLAC TROMETHAMINE 15 MG/ML IJ SOLN
15.0000 mg | Freq: Once | INTRAMUSCULAR | Status: AC
  Administered 2024-02-14: 15 mg via INTRAVENOUS
  Filled 2024-02-14: qty 1

## 2024-02-14 MED ORDER — ONDANSETRON HCL 4 MG/2ML IJ SOLN: 4.0000 mg | Freq: Four times a day (QID) | INTRAMUSCULAR | Status: DC | PRN

## 2024-02-14 MED ORDER — MORPHINE SULFATE (PF) 2 MG/ML IV SOLN
2.0000 mg | INTRAVENOUS | Status: DC | PRN
  Administered 2024-02-14 – 2024-02-15 (×3): 2 mg via INTRAVENOUS
  Filled 2024-02-14 (×4): qty 1

## 2024-02-14 MED ORDER — VANCOMYCIN HCL 2000 MG/400ML IV SOLN
2000.0000 mg | INTRAVENOUS | Status: DC
  Filled 2024-02-14: qty 400

## 2024-02-14 MED ORDER — OXYCODONE-ACETAMINOPHEN 5-325 MG PO TABS
1.0000 | ORAL_TABLET | ORAL | Status: DC | PRN
  Administered 2024-02-15 (×2): 1 via ORAL
  Filled 2024-02-14 (×2): qty 1

## 2024-02-14 MED ORDER — HEPARIN SODIUM (PORCINE) 5000 UNIT/ML IJ SOLN
5000.0000 [IU] | Freq: Three times a day (TID) | INTRAMUSCULAR | Status: DC
  Administered 2024-02-14 – 2024-02-18 (×11): 5000 [IU] via SUBCUTANEOUS
  Filled 2024-02-14 (×12): qty 1

## 2024-02-14 MED ORDER — INSULIN ASPART 100 UNIT/ML IJ SOLN
0.0000 [IU] | Freq: Three times a day (TID) | INTRAMUSCULAR | Status: DC
  Administered 2024-02-15: 3 [IU] via SUBCUTANEOUS
  Administered 2024-02-15: 8 [IU] via SUBCUTANEOUS
  Administered 2024-02-16 – 2024-02-18 (×6): 3 [IU] via SUBCUTANEOUS

## 2024-02-14 MED ORDER — ACETAMINOPHEN 650 MG RE SUPP: 650.0000 mg | Freq: Four times a day (QID) | RECTAL | Status: DC | PRN

## 2024-02-14 MED ORDER — INSULIN ASPART 100 UNIT/ML IJ SOLN
0.0000 [IU] | Freq: Every day | INTRAMUSCULAR | Status: DC
Start: 2024-02-14 — End: 2024-02-18
  Administered 2024-02-15: 2 [IU] via SUBCUTANEOUS

## 2024-02-14 MED ORDER — LAMOTRIGINE ER 50 MG PO TB24
250.0000 mg | ORAL_TABLET | Freq: Every day | ORAL | Status: DC
  Administered 2024-02-14 – 2024-02-18 (×5): 250 mg via ORAL
  Filled 2024-02-14 (×5): qty 1

## 2024-02-14 MED ORDER — VANCOMYCIN HCL 2000 MG/400ML IV SOLN
2000.0000 mg | Freq: Once | INTRAVENOUS | Status: AC
  Administered 2024-02-14: 2000 mg via INTRAVENOUS
  Filled 2024-02-14: qty 400

## 2024-02-14 NOTE — H&P (Addendum)
 History and Physical    [name]:[id number] DOB: 1971-01-23 DOA: 02/14/2024  PCP: Jimmey Mould, MD   Patient coming from: Home  I have personally briefly reviewed patient's old medical records in [facility]  Chief Complaint: Leg pain  HPI: Joshua Conner is a 53 y.o. male with medical history significant of diabetes mellitus type 2, hyperlipidemia, history of seizures, GERD presented with worsening left lower extremity pain and swelling.  Patient apparently incurred a soft tissue wound to the left lower extremity approximately 10 days ago.  Subsequently, he started to notice redness, discomfort, swelling 4 to 5 days ago for which he presented to the ED on 02/11/2024.  He was diagnosed with left lower extremity cellulitis and was discharged home on oral doxycycline  and cefadroxil .  Despite oral antibiotics, his left lower extremity redness, swelling, pain continued to worsen.  Denies fever but had chills.  No vomiting, chest pain, shortness of breath, loss of consciousness, seizures, abdominal pain, cough, diarrhea or dysuria.  ED Course: Patient presented to med [facility].  WBC of 12; x-ray of left tibia and fibula did not show any acute fracture or dislocation.  He was started on IV antibiotics.  He was transferred to [facility]. Hospitalist service was called to evaluate the patient.  Review of Systems: As per HPI otherwise all other systems were reviewed and are negative.   Past Medical History:  Diagnosis Date   Acid reflux    Seizures (HCC)    Varicose veins     Past Surgical History:  Procedure Laterality Date   HERNIA REPAIR     INGUINAL HERNIA REPAIR Left 02/04/2015   Procedure: LAPAROSCOPIC LEFT  INGUINAL HERNIA REPAIR WITH MESH;  Surgeon: Shela Derby, MD;  Location: [facility] ORS;  Service: General;  Laterality: Left;   INSERTION OF MESH Left 02/04/2015   Procedure: INSERTION OF MESH;  Surgeon: Shela Derby, MD;  Location: [facility] ORS;   Service: General;  Laterality: Left;   KNEE SURGERY     VARICOSE VEIN SURGERY       reports that he has never smoked. He does not have any smokeless tobacco history on file. He reports current alcohol use of about 12.0 standard drinks of alcohol per week. He reports that he does not use drugs.  No Known Allergies  Family History  Problem Relation Age of Onset   Hypertension Mother    COPD Mother    Cancer Mother 38       breast   Hypertension Father    Hypertension Sister     Prior to Admission medications   Medication Sig Start Date End Date Taking? Authorizing Provider  bacitracin  500 UNIT/GM ointment Apply 1 Application topically 2 (two) times daily. 02/11/24   Timberlake Butter, PA  cefadroxil  (DURICEF) 500 MG capsule Take 1 capsule (500 mg total) by mouth 2 (two) times daily. 02/11/24   Neil Balls A, PA  chlorhexidine  (HIBICLENS ) 4 % external liquid Apply topically daily as needed. 02/11/24   [place] Butter, PA  doxycycline  (VIBRAMYCIN ) 100 MG capsule Take 1 capsule (100 mg total) by mouth 2 (two) times daily. 02/11/24   Jasper Butter, PA  ibuprofen  (ADVIL ,MOTRIN ) 600 MG tablet Take 1 tablet (600 mg total) by mouth every 6 (six) hours as needed. Patient not taking: Reported on 01/29/2015 03/12/14   Eldon Greenland, MD  JARDIANCE 25 MG TABS tablet Take 25 mg by mouth daily. 01/26/24   [provider]  LamoTRIgine  250 MG TB24 24 hour tablet Take 1 tablet by mouth daily. 01/26/24   [provider]  LORazepam  (ATIVAN ) 1 MG tablet Take 1 tablet (1 mg total) by mouth 2 (two) times daily. Patient not taking: Reported on 01/29/2015 03/12/14   Eldon Greenland, MD  meloxicam  (MOBIC ) 15 MG tablet Take 1 tablet (15 mg total) by mouth daily as needed. 02/11/24   [place] Butter, PA  oxyCODONE -acetaminophen  (PERCOCET) 7.5-325 MG per tablet Take 1 tablet by mouth every 4 (four) hours as needed for pain. 02/04/15   Shela Derby, MD  oxyCODONE -acetaminophen   (PERCOCET/ROXICET) 5-325 MG per tablet Take 1 tablet by mouth every 6 (six) hours as needed for severe pain. 01/29/15   Lawyer, Veryl Gottron, PA-C  pantoprazole  (PROTONIX ) 40 MG tablet Take 40 mg by mouth daily.    [provider]  rosuvastatin  (CRESTOR ) 10 MG tablet Take 10 mg by mouth at bedtime. 01/26/24   [provider]  Soft Lens Products (CLEAR EYES CONTACT LENS RELIEF) SOLN Place 1 drop into both eyes as needed (for dry eyes). For rewetting contact lenses.    [provider]    Physical Exam: Vitals:   02/14/24 1206 02/14/24 1215 02/14/24 1230 02/14/24 1322  BP: 130/88   (!) 145/95  Pulse: 83 75 77 74  Resp: 20 16 17    Temp: 98.4 F (36.9 C)   98 F (36.7 C)  TempSrc: Oral   Oral  SpO2: 98% 96% 97% 100%  Weight:    (!) 137.1 kg  Height:    6' (1.829 m)    Constitutional: NAD, calm, comfortable Vitals:   02/14/24 1206 02/14/24 1215 02/14/24 1230 02/14/24 1322  BP: 130/88   (!) 145/95  Pulse: 83 75 77 74  Resp: 20 16 17    Temp: 98.4 F (36.9 C)   98 F (36.7 C)  TempSrc: Oral   Oral  SpO2: 98% 96% 97% 100%  Weight:    (!) 137.1 kg  Height:    6' (1.829 m)   Eyes: PERRL, lids and conjunctivae normal ENMT: Mucous membranes are moist. Posterior pharynx clear of any exudate or lesions. Neck: normal, supple, no masses, no thyromegaly Respiratory: bilateral decreased breath sounds at bases, no wheezing, no crackles. Normal respiratory effort. No accessory muscle use.  Cardiovascular: S1 S2 positive, rate controlled. No extremity edema. 2+ pedal pulses.  Abdomen: Morbidly obese, no tenderness, no masses palpated. No hepatosplenomegaly. Bowel sounds positive.  Musculoskeletal: no clubbing / cyanosis. No joint deformity upper and lower extremities.  Skin: Left mid shin area is erythematous, tender, swollen anteriorly with no obvious fluctuation. Neurologic: CN 2-12 grossly intact. Moving extremities. No focal neurologic deficits.  Psychiatric: Normal  judgment and insight. Alert and oriented x 3. Normal mood.    Labs on Admission: I have personally reviewed following labs and imaging studies  CBC: Recent Labs  Lab 02/13/24 2227  WBC 12.0*  NEUTROABS 9.5*  HGB 14.8  HCT 44.4  MCV 90.1  PLT 389   Basic Metabolic Panel: Recent Labs  Lab 02/13/24 2227  NA 138  K 4.1  CL 102  CO2 26  GLUCOSE 157*  BUN 23*  CREATININE 1.56*  CALCIUM  9.0   GFR: Estimated Creatinine Clearance: 79.4 mL/min (A) (by C-G formula based on SCr of 1.56 mg/dL (H)). Liver Function Tests: Recent Labs  Lab 02/13/24 2227  AST 17  ALT 24  ALKPHOS 74  BILITOT 0.9  PROT 7.5  ALBUMIN 4.0   No results for  input(s): "LIPASE", "AMYLASE" in the last 168 hours. No results for input(s): "AMMONIA" in the last 168 hours. Coagulation Profile: No results for input(s): "INR", "PROTIME" in the last 168 hours. Cardiac Enzymes: No results for input(s): "CKTOTAL", "CKMB", "CKMBINDEX", "TROPONINI" in the last 168 hours. BNP (last 3 results) No results for input(s): "PROBNP" in the last 8760 hours. HbA1C: No results for input(s): "HGBA1C" in the last 72 hours. CBG: No results for input(s): "GLUCAP" in the last 168 hours. Lipid Profile: No results for input(s): "CHOL", "HDL", "LDLCALC", "TRIG", "CHOLHDL", "LDLDIRECT" in the last 72 hours. Thyroid Function Tests: No results for input(s): "TSH", "T4TOTAL", "FREET4", "T3FREE", "THYROIDAB" in the last 72 hours. Anemia Panel: No results for input(s): "VITAMINB12", "FOLATE", "FERRITIN", "TIBC", "IRON", "RETICCTPCT" in the last 72 hours. Urine analysis:    Component Value Date/Time   COLORURINE YELLOW 01/29/2015 1609   APPEARANCEUR CLEAR 01/29/2015 1609   LABSPEC 1.021 01/29/2015 1609   PHURINE 6.0 01/29/2015 1609   GLUCOSEU NEGATIVE 01/29/2015 1609   HGBUR NEGATIVE 01/29/2015 1609   BILIRUBINUR NEGATIVE 01/29/2015 1609   KETONESUR NEGATIVE 01/29/2015 1609   PROTEINUR NEGATIVE 01/29/2015 1609   UROBILINOGEN  0.2 01/29/2015 1609   NITRITE NEGATIVE 01/29/2015 1609   LEUKOCYTESUR NEGATIVE 01/29/2015 1609    Radiological Exams on Admission: No results found.   Assessment/Plan  Left lower extremity cellulitis with failed outpatient oral antibiotic treatment - Was on oral doxycycline  and cefadroxil  from 02/11/2024 onwards with worsening left lower extremity cellulitis.  X-ray does not show any acute fracture or dislocation.  Continue Rocephin .  Add vancomycin .  There might be underlying abscess as well.  Check CT of the left lower extremity with contrast.  History of seizures -No recent seizures.  Continue home lamotrigine .  Outpatient follow-up with neurology.  Diabetes mellitus type 2 - Hold Jardiance.  CBGs with SSI.  Carb modified diet.  A1c in AM  Hyperlipidemia - Resume home medication once verified  Leukocytosis - From above.  Monitor  GERD - Continue PPI  Obesity class III - Outpatient follow-up   DVT prophylaxis: Heparin  subcutaneous Code Status: Full Family Communication: daughter at bedside Disposition Plan: Home in 2 to 3 days pending clinical improvement Consults called: None Admission status: Inpatient/telemetry  Severity of Illness: The appropriate patient status for this patient is INPATIENT. Inpatient status is judged to be reasonable and necessary in order to provide the required intensity of service to ensure the patient's safety. The patient's presenting symptoms, physical exam findings, and initial radiographic and laboratory data in the context of their chronic comorbidities is felt to place them at high risk for further clinical deterioration. Furthermore, it is not anticipated that the patient will be medically stable for discharge from the hospital within 2 midnights of admission.   * I certify that at the point of admission it is my clinical judgment that the patient will require inpatient hospital care spanning beyond 2 midnights from the point of admission  due to high intensity of service, high risk for further deterioration and high frequency of surveillance required.Audria Leather MD [facility]  02/14/2024, 1:38 PM

## 2024-02-14 NOTE — Progress Notes (Signed)
 Dressing to LLE fell off. Old bloody drainage noted on old dressing. Redressed with Aquacel 4x4 and kerlex. Lt leg elevated on 2 pillows. Family in to visit.

## 2024-02-14 NOTE — Plan of Care (Signed)
  Problem: Education: Goal: Knowledge of General Education information will improve Description: Including pain rating scale, medication(s)/side effects and non-pharmacologic comfort measures Outcome: Progressing   Problem: Health Behavior/Discharge Planning: Goal: Ability to manage health-related needs will improve Outcome: Progressing   Problem: Clinical Measurements: Goal: Ability to maintain clinical measurements within normal limits will improve Outcome: Progressing Goal: Will remain free from infection Outcome: Progressing Goal: Diagnostic test results will improve Outcome: Progressing   Problem: Activity: Goal: Risk for activity intolerance will decrease Outcome: Progressing   Problem: Elimination: Goal: Will not experience complications related to bowel motility Outcome: Progressing   Problem: Pain Managment: Goal: General experience of comfort will improve and/or be controlled Outcome: Progressing   Problem: Safety: Goal: Ability to remain free from injury will improve Outcome: Progressing   Problem: Skin Integrity: Goal: Risk for impaired skin integrity will decrease Outcome: Progressing   Problem: Education: Goal: Ability to describe self-care measures that may prevent or decrease complications (Diabetes Survival Skills Education) will improve Outcome: Progressing   Problem: Coping: Goal: Ability to adjust to condition or change in health will improve Outcome: Progressing   Problem: Fluid Volume: Goal: Ability to maintain a balanced intake and output will improve Outcome: Progressing   Problem: Health Behavior/Discharge Planning: Goal: Ability to identify and utilize available resources and services will improve Outcome: Progressing Goal: Ability to manage health-related needs will improve Outcome: Progressing   Problem: Metabolic: Goal: Ability to maintain appropriate glucose levels will improve Outcome: Progressing   Problem: Nutritional: Goal:  Maintenance of adequate nutrition will improve Outcome: Progressing Goal: Progress toward achieving an optimal weight will improve Outcome: Progressing   Problem: Skin Integrity: Goal: Risk for impaired skin integrity will decrease Outcome: Progressing   Problem: Tissue Perfusion: Goal: Adequacy of tissue perfusion will improve Outcome: Progressing

## 2024-02-14 NOTE — Progress Notes (Signed)
 Hospitalist Transfer Note:    Nursing staff, Please call [facility] Admits & Consults System-Wide number on Amion [phone]) as soon as patient's arrival, so appropriate admitting provider can evaluate the pt.   Transferring facility: [facility] Requesting provider: Dr. Bolivar Bushman (EDP at [facility]) Reason for transfer: admission for further evaluation and management of left lower extremity cellulitis, refractory to outpatient oral antibiotics.   52 male, who presented to [facility] ED complaining of worsening erythema, pain, swelling involving the left lower extremity. The patient had incurred a soft tissue wound to the left lower extremity approximately 10 days ago.  Following the initial left lower extremity wound, he began to notice surrounding erythema, discomfort, swelling starting 4 to 5 days ago, at which time he presented to the emergency department on Friday, 02/11/2024 and was diagnosed with left lower extremity cellulitis and was subsequently discharged from the emergency department with oral doxycycline .  In spite of good compliance in the interval with this oral doxycycline , the patient reports further progression in the left lower extremity erythema, tenderness, increased warmth, swelling involving the left lower extremity, prompting him to present back to [facility] this evening for further evaluation and management thereof.  Labs were notable for mild leukocytosis on presenting CBC.  Medications administered prior to transfer included the following: Rocephin    Subsequently, I accepted this patient for transfer for inpatient admission to a med-tele bed at [facility] or [facility] (first available) for further work-up and management of the above.      [name], DO Hospitalist

## 2024-02-14 NOTE — Progress Notes (Signed)
 Pharmacy Antibiotic Note  Joshua Conner is a 53 y.o. male admitted on 02/14/2024 with cellulitis.  Pharmacy has been consulted for vancomycin  dosing.  Plan: Vancomycin  2000 mg IV q24 hrs for eAUC 531.8. Css min 11.9 using SCr 1.56, VD 0.5 Ceftriaxone  2 gm IV q24 per MD F/u renal function, WBC, temp, culture data Vancomycin  levels as needed  Height: 6' (182.9 cm) Weight: (!) 137.1 kg (302 lb 4.8 oz) IBW/kg (Calculated) : 77.6  Temp (24hrs), Avg:97.9 F (36.6 C), Min:97.5 F (36.4 C), Max:98.4 F (36.9 C)  Recent Labs  Lab 02/13/24 2227  WBC 12.0*  CREATININE 1.56*    Estimated Creatinine Clearance: 79.4 mL/min (A) (by C-G formula based on SCr of 1.56 mg/dL (H)).    No Known Allergies  Antimicrobials this admission: 4/21 CTX>> 4/21 Vancomycin >>  Dose adjustments this admission:  Microbiology results: 4/21 BCx2:   Thank you for allowing pharmacy to be a part of this patient's care.  Rubie Corona, Pharm.D Use secure chat for questions 02/14/2024 2:10 PM

## 2024-02-14 NOTE — ED Provider Notes (Signed)
 [place] [facility] AT [facility]  Provider Note  CSN: [id number] Arrival date & time: 02/13/24 2218  History Chief Complaint  Patient presents with   Leg Pain    Joshua Conner is a 53 y.o. male with history of diabetes presents for re-evaluation of L leg wound, initially injured the leg over a week ago, seen on 4/18 for pain and swelling and started on oral doxycycline . Patient reports worsening despite compliance. Denies fever but has had chills.    Home Medications Prior to Admission medications   Medication Sig Start Date End Date Taking? Authorizing Provider  bacitracin  500 UNIT/GM ointment Apply 1 Application topically 2 (two) times daily. 02/11/24   [place] Butter, PA  cefadroxil  (DURICEF) 500 MG capsule Take 1 capsule (500 mg total) by mouth 2 (two) times daily. 02/11/24   Neil Balls A, PA  chlorhexidine  (HIBICLENS ) 4 % external liquid Apply topically daily as needed. 02/11/24   [place] Butter, PA  doxycycline  (VIBRAMYCIN ) 100 MG capsule Take 1 capsule (100 mg total) by mouth 2 (two) times daily. 02/11/24   [place] Butter, PA  ibuprofen  (ADVIL ,MOTRIN ) 600 MG tablet Take 1 tablet (600 mg total) by mouth every 6 (six) hours as needed. Patient not taking: Reported on 01/29/2015 03/12/14   Eldon Greenland, MD  LamoTRIgine  XR 200 MG TB24 Take 200 mg by mouth daily.     [provider]  LORazepam  (ATIVAN ) 1 MG tablet Take 1 tablet (1 mg total) by mouth 2 (two) times daily. Patient not taking: Reported on 01/29/2015 03/12/14   Eldon Greenland, MD  meloxicam  (MOBIC ) 15 MG tablet Take 1 tablet (15 mg total) by mouth daily as needed. 02/11/24   [place] Butter, PA  oxyCODONE -acetaminophen  (PERCOCET) 7.5-325 MG per tablet Take 1 tablet by mouth every 4 (four) hours as needed for pain. 02/04/15   Shela Derby, MD  oxyCODONE -acetaminophen  (PERCOCET/ROXICET) 5-325 MG per tablet Take 1 tablet by mouth every 6 (six) hours as needed for severe pain.  01/29/15   Lawyer, Veryl Gottron, PA-C  pantoprazole  (PROTONIX ) 40 MG tablet Take 40 mg by mouth daily.    [provider]  Soft Lens Products (CLEAR EYES CONTACT LENS RELIEF) SOLN Place 1 drop into both eyes as needed (for dry eyes). For rewetting contact lenses.    [provider]     Allergies    Patient has no known allergies.   Review of Systems   Review of Systems Please see HPI for pertinent positives and negatives  Physical Exam BP (!) 156/99 (BP Location: Right Arm)   Pulse (!) 116   Temp 98.1 F (36.7 C) (Oral)   Resp 18   Ht 6\' 1"  (1.854 m)   Wt 128 kg   SpO2 95%   BMI 37.23 kg/m   Physical Exam Vitals and nursing note reviewed.  HENT:     Head: Normocephalic.     Nose: Nose normal.  Eyes:     Extraocular Movements: Extraocular movements intact.  Pulmonary:     Effort: Pulmonary effort is normal.  Musculoskeletal:        General: Normal range of motion.     Cervical back: Neck supple.     Comments: Large superficial wound on L anterior lower leg with significant surrounding erythema, warmth and induration, no purulent drainage.   Skin:    Findings: No rash (on exposed skin).  Neurological:     Mental Status: He is alert and oriented to person,  place, and time.  Psychiatric:        Mood and Affect: Mood normal.     ED Results / Procedures / Treatments   EKG None  Procedures Procedures  Medications Ordered in the ED Medications  cefTRIAXone  (ROCEPHIN ) 2 g in sodium chloride  0.9 % 100 mL IVPB (2 g Intravenous New Bag/Given 02/14/24 0147)    Initial Impression and Plan  Patient here with worsening cellulitis despite oral antibiotics x 2 days. Labs done in triage show CBC with leukocytosis, BMP with mildly increased Cr, similar to previous many years ago. Given failure of outpatient therapy, will begin IV rocephin , add cultures and discuss with hospitalist for admission.   ED Course   Clinical Course as of 02/14/24 0156  Mon Feb 14, 2024  0147 Spoke with Dr. Brock Canner, Hospitalist, who will accept for admission.  [CS]    Clinical Course User Index [CS] Charmayne Cooper, MD     MDM Rules/Calculators/A&P Medical Decision Making Problems Addressed: Cellulitis of left leg: acute illness or injury  Amount and/or Complexity of Data Reviewed Labs: ordered. Decision-making details documented in ED Course.  Risk Prescription drug management. Decision regarding hospitalization.     Final Clinical Impression(s) / ED Diagnoses Final diagnoses:  Cellulitis of left leg    Rx / DC Orders ED Discharge Orders     None        Charmayne Cooper, MD 02/14/24 [phone]

## 2024-02-15 ENCOUNTER — Inpatient Hospital Stay (HOSPITAL_COMMUNITY)

## 2024-02-15 DIAGNOSIS — L03116 Cellulitis of left lower limb: Secondary | ICD-10-CM | POA: Diagnosis not present

## 2024-02-15 LAB — BASIC METABOLIC PANEL WITH GFR
Anion gap: 9 (ref 5–15)
BUN: 22 mg/dL — ABNORMAL HIGH (ref 6–20)
CO2: 24 mmol/L (ref 22–32)
Calcium: 8.6 mg/dL — ABNORMAL LOW (ref 8.9–10.3)
Chloride: 101 mmol/L (ref 98–111)
Creatinine, Ser: 1.2 mg/dL (ref 0.61–1.24)
GFR, Estimated: >60 mL/min (ref >60)
Glucose, Bld: 117 mg/dL — ABNORMAL HIGH (ref 70–99)
Potassium: 4.2 mmol/L (ref 3.5–5.1)
Sodium: 134 mmol/L — ABNORMAL LOW (ref 135–145)

## 2024-02-15 LAB — HEMOGLOBIN A1C
Hgb A1c MFr Bld: 6.3 % — ABNORMAL HIGH (ref 4.8–5.6)
Mean Plasma Glucose: 134.11 mg/dL

## 2024-02-15 LAB — HIV ANTIBODY (ROUTINE TESTING W REFLEX): HIV Screen 4th Generation wRfx: NONREACTIVE

## 2024-02-15 LAB — MAGNESIUM: Magnesium: 2.2 mg/dL (ref 1.7–2.4)

## 2024-02-15 LAB — CBC
HCT: 42.2 % (ref 39.0–52.0)
Hemoglobin: 13.3 g/dL (ref 13.0–17.0)
MCH: 30.1 pg (ref 26.0–34.0)
MCHC: 31.5 g/dL (ref 30.0–36.0)
MCV: 95.5 fL (ref 80.0–100.0)
Platelets: 317 10*3/uL (ref 150–400)
RBC: 4.42 MIL/uL (ref 4.22–5.81)
RDW: 13.1 % (ref 11.5–15.5)
WBC: 7 10*3/uL (ref 4.0–10.5)
nRBC: 0 % (ref 0.0–0.2)

## 2024-02-15 LAB — GLUCOSE, CAPILLARY
Glucose-Capillary: 133 mg/dL — ABNORMAL HIGH (ref 70–99)
Glucose-Capillary: 124 mg/dL — ABNORMAL HIGH (ref 70–99)
Glucose-Capillary: 136 mg/dL — ABNORMAL HIGH (ref 70–99)
Glucose-Capillary: 213 mg/dL — ABNORMAL HIGH (ref 70–99)

## 2024-02-15 LAB — C-REACTIVE PROTEIN: CRP: 9.8 mg/dL — ABNORMAL HIGH (ref <1.0)

## 2024-02-15 MED ORDER — ROSUVASTATIN CALCIUM 10 MG PO TABS
10.0000 mg | ORAL_TABLET | Freq: Every morning | ORAL | Status: DC
Start: 2024-02-15 — End: 2024-02-18
  Administered 2024-02-15 – 2024-02-18 (×4): 10 mg via ORAL
  Filled 2024-02-15 (×4): qty 1

## 2024-02-15 MED ORDER — IOHEXOL 300 MG/ML  SOLN
100.0000 mL | Freq: Once | INTRAMUSCULAR | Status: AC | PRN
  Administered 2024-02-15: 100 mL via INTRAVENOUS

## 2024-02-15 MED ORDER — SODIUM CHLORIDE (PF) 0.9 % IJ SOLN
INTRAMUSCULAR | Status: AC
  Filled 2024-02-15: qty 50

## 2024-02-15 MED ORDER — VANCOMYCIN HCL 1250 MG/250ML IV SOLN
1250.0000 mg | Freq: Two times a day (BID) | INTRAVENOUS | Status: DC
  Administered 2024-02-15 – 2024-02-16 (×3): 1250 mg via INTRAVENOUS
  Filled 2024-02-15 (×3): qty 250

## 2024-02-15 MED ORDER — MEDIHONEY WOUND/BURN DRESSING EX PSTE
1.0000 | PASTE | Freq: Every day | CUTANEOUS | Status: DC
  Administered 2024-02-15 – 2024-02-18 (×4): 1 via TOPICAL
  Filled 2024-02-15: qty 44

## 2024-02-15 NOTE — Consult Note (Signed)
 WOC Nurse Consult Note: patient states he had a piece of granite fall on L leg 8 days ago  Reason for Consult: L leg cellulitis  Wound type: full thickness r/t trauma now infectious  Pressure Injury POA: NA not related to pressure  Measurement: see nursing flowsheet  Wound bed:50% dark hemorrhagic tissue 50% yellow necrotic  Drainage (amount, consistency, odor) see nursing flowsheet  Periwound: erythema and edema  Dressing procedure/placement/frequency: Cleanse L lower leg wound with Vashe wound cleanser Timm Foot [phone]), do not rinse and allow to air dry. Apply Medihoney to wound bed daily, cover with dry gauze and secure with Kerlix roll gauze anchored around foot or silicone foam whichever patient prefers.   POC discussed with bedside nurse. WOC team will not follow. Re-consult if further needs arise.   Thank you,    Ronni Colace MSN, RN-BC, [employer] [phone]

## 2024-02-15 NOTE — Progress Notes (Addendum)
 Pharmacy Antibiotic Note  Joshua Conner is a 53 y.o. male admitted on 02/14/2024 with LE wound infection.  Pharmacy has been consulted for vancomycin  dosing.  02/15/2024 Day #2 vancomycin /ceftriaxone . WBC down to 7, SCr down to 1.2 CRP 9.8 Afebrile CT ordered to r/o abscess  Plan: Change Vancomycin  2000 mg IV q24 hrs to vancomycin  1250 mg IV q12hrs for eAUC 521 Css min 15.4 using SCr 1.2, VD 0.5 Ceftriaxone  2 gm IV q24 per MD F/u renal function, WBC, temp, culture data Vancomycin  levels as needed  Height: 6' (182.9 cm) Weight: (!) 137.1 kg (302 lb 4.8 oz) IBW/kg (Calculated) : 77.6  Temp (24hrs), Avg:98.2 F (36.8 C), Min:97.7 F (36.5 C), Max:98.6 F (37 C)  Recent Labs  Lab 02/13/24 2227 02/14/24 1405 02/15/24 0614  WBC 12.0* 8.2 7.0  CREATININE 1.56* 1.26* 1.20    Estimated Creatinine Clearance: 103.3 mL/min (by C-G formula based on SCr of 1.2 mg/dL).    No Known Allergies  Antimicrobials this admission: 4/21 CTX>> 4/21 Vancomycin >>  Dose adjustments this admission: 4/22 vanc 2 q24> 1250 q12h Microbiology results: 4/21 BCx2: ngtd  Thank you for allowing pharmacy to be a part of this patient's care.  Rubie Corona, Pharm.D Use secure chat for questions 02/15/2024 11:38 AM

## 2024-02-15 NOTE — Progress Notes (Signed)
 PROGRESS NOTE    Joshua Conner  ZOX:[id number] DOB: Aug 19, 1971 DOA: 02/14/2024 PCP: Jimmey Mould, MD   Brief Narrative:  53 y.o. male with medical history significant of diabetes mellitus type 2, hyperlipidemia, history of seizures, GERD presented with worsening left lower extremity pain and swelling despite being on oral doxycycline  and cefadroxil  for few days..  On presentation, WBC was 12, x-ray of left tibia and fibula did not show any acute fracture or dislocation. He was started on IV antibiotics.   Assessment & Plan:   Left lower extremity cellulitis with failed outpatient oral antibiotic treatment - Was on oral doxycycline  and cefadroxil  from 02/11/2024 onwards with worsening left lower extremity cellulitis.  X-ray did not show any acute fracture or dislocation.  Continue Rocephin  and vancomycin .  CT of the left lower extremity with contrast pending.  Will make him n.p.o. in case patient needs any surgical intervention  History of seizures -No recent seizures.  Continue home lamotrigine .  Outpatient follow-up with neurology.   Diabetes mellitus type 2 - Hold Jardiance.  CBGs with SSI.  Carb modified diet.  A1c pending   Hyperlipidemia - Resume home medication once verified   Leukocytosis - Resolved   GERD - Continue PPI   Obesity class III - Outpatient follow-up     DVT prophylaxis: Heparin  subcutaneous Code Status: Full Family Communication: daughter at bedside on 02/14/2024.  None at bedside today. Disposition Plan: Status is: Inpatient Remains inpatient appropriate because: Of severity of illness    Consultants: None  Procedures: None  Antimicrobials: Rocephin  and vancomycin  from 02/14/2024 onwards   Subjective: Patient seen and examined at bedside.  Still complains of left lower extremity pain and swelling but no fever or vomiting reported. Objective: Vitals:   02/14/24 1755 02/14/24 2123 02/15/24 0124 02/15/24 0552  BP: (!) 147/96 (!) 145/81  132/87 (!) 143/86  Pulse: 85 69 72 80  Resp: 18 14 14 14   Temp: 97.7 F (36.5 C) 98.6 F (37 C) 98.2 F (36.8 C) 98.3 F (36.8 C)  TempSrc: Oral Oral Oral Oral  SpO2: 99% 98% 97% 97%  Weight:      Height:        Intake/Output Summary (Last 24 hours) at 02/15/2024 0833 Last data filed at 02/15/2024 0700 Gross per 24 hour  Intake --  Output 1700 ml  Net -1700 ml   Filed Weights   02/13/24 2223 02/14/24 1322  Weight: 128 kg (!) 137.1 kg    Examination:  General exam: Appears calm and comfortable.  On room air. Respiratory system: Bilateral decreased breath sounds at bases, no wheezing Cardiovascular system: S1 & S2 heard, Rate controlled Gastrointestinal system: Abdomen is morbidly obese, nondistended, soft and nontender. Normal bowel sounds heard. Extremities: No cyanosis, clubbing; left lower extremity edema present Central nervous system: Alert and oriented. No focal neurological deficits. Moving extremities Skin: Left mid shin area erythema, tenderness and swelling anteriorly present  psychiatry: Judgement and insight appear normal. Mood & affect appropriate.     Data Reviewed: I have personally reviewed following labs and imaging studies  CBC: Recent Labs  Lab 02/13/24 2227 02/14/24 1405 02/15/24 0614  WBC 12.0* 8.2 7.0  NEUTROABS 9.5* 5.8  --   HGB 14.8 14.3 13.3  HCT 44.4 45.4 42.2  MCV 90.1 94.6 95.5  PLT 389 350 317   Basic Metabolic Panel: Recent Labs  Lab 02/13/24 2227 02/14/24 1405 02/15/24 0614  NA 138 137 134*  K 4.1 3.7 4.2  CL 102 103  101  CO2 26 25 24   GLUCOSE 157* 122* 117*  BUN 23* 23* 22*  CREATININE 1.56* 1.26* 1.20  CALCIUM  9.0 8.9 8.6*  MG  --  2.2 2.2   GFR: Estimated Creatinine Clearance: 103.3 mL/min (by C-G formula based on SCr of 1.2 mg/dL). Liver Function Tests: Recent Labs  Lab 02/13/24 2227 02/14/24 1405  AST 17 16  ALT 24 22  ALKPHOS 74 65  BILITOT 0.9 0.9  PROT 7.5 7.1  ALBUMIN 4.0 3.9   No results for  input(s): "LIPASE", "AMYLASE" in the last 168 hours. No results for input(s): "AMMONIA" in the last 168 hours. Coagulation Profile: No results for input(s): "INR", "PROTIME" in the last 168 hours. Cardiac Enzymes: No results for input(s): "CKTOTAL", "CKMB", "CKMBINDEX", "TROPONINI" in the last 168 hours. BNP (last 3 results) No results for input(s): "PROBNP" in the last 8760 hours. HbA1C: No results for input(s): "HGBA1C" in the last 72 hours. CBG: Recent Labs  Lab 02/14/24 1555 02/14/24 2124 02/15/24 0722  GLUCAP 107* 137* 133*   Lipid Profile: No results for input(s): "CHOL", "HDL", "LDLCALC", "TRIG", "CHOLHDL", "LDLDIRECT" in the last 72 hours. Thyroid Function Tests: No results for input(s): "TSH", "T4TOTAL", "FREET4", "T3FREE", "THYROIDAB" in the last 72 hours. Anemia Panel: No results for input(s): "VITAMINB12", "FOLATE", "FERRITIN", "TIBC", "IRON", "RETICCTPCT" in the last 72 hours. Sepsis Labs: No results for input(s): "PROCALCITON", "LATICACIDVEN" in the last 168 hours.  No results found for this or any previous visit (from the past 240 hours).       Radiology Studies: No results found.      Scheduled Meds:  heparin   5,000 Units Subcutaneous Q8H   insulin  aspart  0-20 Units Subcutaneous TID WC   insulin  aspart  0-5 Units Subcutaneous QHS   lamoTRIgine   250 mg Oral Daily   leptospermum manuka honey  1 Application Topical Daily   pantoprazole   40 mg Oral Daily   Continuous Infusions:  sodium chloride  1,000 mL (02/14/24 1410)   cefTRIAXone  (ROCEPHIN )  IV 2 g (02/14/24 2219)   vancomycin             Audria Leather, MD [facility] 02/15/2024, 8:33 AM

## 2024-02-15 NOTE — Plan of Care (Signed)
  Problem: Education: Goal: Knowledge of General Education information will improve Description: Including pain rating scale, medication(s)/side effects and non-pharmacologic comfort measures Outcome: Progressing   Problem: Health Behavior/Discharge Planning: Goal: Ability to manage health-related needs will improve Outcome: Progressing   Problem: Clinical Measurements: Goal: Ability to maintain clinical measurements within normal limits will improve Outcome: Progressing Goal: Will remain free from infection Outcome: Progressing Goal: Diagnostic test results will improve Outcome: Progressing   Problem: Activity: Goal: Risk for activity intolerance will decrease Outcome: Progressing   Problem: Elimination: Goal: Will not experience complications related to bowel motility Outcome: Progressing   Problem: Pain Managment: Goal: General experience of comfort will improve and/or be controlled Outcome: Progressing   Problem: Safety: Goal: Ability to remain free from injury will improve Outcome: Progressing   Problem: Skin Integrity: Goal: Risk for impaired skin integrity will decrease Outcome: Progressing   Problem: Education: Goal: Ability to describe self-care measures that may prevent or decrease complications (Diabetes Survival Skills Education) will improve Outcome: Progressing Goal: Individualized Educational Video(s) Outcome: Progressing   Problem: Coping: Goal: Ability to adjust to condition or change in health will improve Outcome: Progressing   Problem: Fluid Volume: Goal: Ability to maintain a balanced intake and output will improve Outcome: Progressing   Problem: Health Behavior/Discharge Planning: Goal: Ability to identify and utilize available resources and services will improve Outcome: Progressing Goal: Ability to manage health-related needs will improve Outcome: Progressing   Problem: Metabolic: Goal: Ability to maintain appropriate glucose levels  will improve Outcome: Progressing   Problem: Nutritional: Goal: Maintenance of adequate nutrition will improve Outcome: Progressing Goal: Progress toward achieving an optimal weight will improve Outcome: Progressing   Problem: Skin Integrity: Goal: Risk for impaired skin integrity will decrease Outcome: Progressing   Problem: Tissue Perfusion: Goal: Adequacy of tissue perfusion will improve Outcome: Progressing

## 2024-02-16 DIAGNOSIS — L03116 Cellulitis of left lower limb: Secondary | ICD-10-CM | POA: Diagnosis not present

## 2024-02-16 LAB — CBC WITH DIFFERENTIAL/PLATELET
Abs Immature Granulocytes: 0.02 10*3/uL (ref 0.00–0.07)
Basophils Absolute: 0.1 10*3/uL (ref 0.0–0.1)
Basophils Relative: 1 %
Eosinophils Absolute: 0.1 10*3/uL (ref 0.0–0.5)
Eosinophils Relative: 2 %
HCT: 44.4 % (ref 39.0–52.0)
Hemoglobin: 13.9 g/dL (ref 13.0–17.0)
Immature Granulocytes: 0 %
Lymphocytes Relative: 17 %
Lymphs Abs: 1.1 10*3/uL (ref 0.7–4.0)
MCH: 29.7 pg (ref 26.0–34.0)
MCHC: 31.3 g/dL (ref 30.0–36.0)
MCV: 94.9 fL (ref 80.0–100.0)
Monocytes Absolute: 0.5 10*3/uL (ref 0.1–1.0)
Monocytes Relative: 8 %
Neutro Abs: 4.7 10*3/uL (ref 1.7–7.7)
Neutrophils Relative %: 72 %
Platelets: 354 10*3/uL (ref 150–400)
RBC: 4.68 MIL/uL (ref 4.22–5.81)
RDW: 12.9 % (ref 11.5–15.5)
WBC: 6.6 10*3/uL (ref 4.0–10.5)
nRBC: 0 % (ref 0.0–0.2)

## 2024-02-16 LAB — MAGNESIUM: Magnesium: 2.1 mg/dL (ref 1.7–2.4)

## 2024-02-16 LAB — BASIC METABOLIC PANEL WITH GFR
Anion gap: 12 (ref 5–15)
BUN: 20 mg/dL (ref 6–20)
CO2: 23 mmol/L (ref 22–32)
Calcium: 9.2 mg/dL (ref 8.9–10.3)
Chloride: 102 mmol/L (ref 98–111)
Creatinine, Ser: 1.16 mg/dL (ref 0.61–1.24)
GFR, Estimated: >60 mL/min (ref >60)
Glucose, Bld: 108 mg/dL — ABNORMAL HIGH (ref 70–99)
Potassium: 4 mmol/L (ref 3.5–5.1)
Sodium: 137 mmol/L (ref 135–145)

## 2024-02-16 LAB — GLUCOSE, CAPILLARY
Glucose-Capillary: 130 mg/dL — ABNORMAL HIGH (ref 70–99)
Glucose-Capillary: 144 mg/dL — ABNORMAL HIGH (ref 70–99)
Glucose-Capillary: 114 mg/dL — ABNORMAL HIGH (ref 70–99)
Glucose-Capillary: 156 mg/dL — ABNORMAL HIGH (ref 70–99)

## 2024-02-16 LAB — C-REACTIVE PROTEIN: CRP: 8.5 mg/dL — ABNORMAL HIGH (ref <1.0)

## 2024-02-16 MED ORDER — AMOXICILLIN-POT CLAVULANATE 875-125 MG PO TABS
1.0000 | ORAL_TABLET | Freq: Two times a day (BID) | ORAL | Status: DC
  Administered 2024-02-16 – 2024-02-18 (×4): 1 via ORAL
  Filled 2024-02-16 (×4): qty 1

## 2024-02-16 MED ORDER — LINEZOLID 600 MG PO TABS
600.0000 mg | ORAL_TABLET | Freq: Two times a day (BID) | ORAL | Status: DC
  Administered 2024-02-16 – 2024-02-18 (×4): 600 mg via ORAL
  Filled 2024-02-16 (×4): qty 1

## 2024-02-16 NOTE — Consult Note (Addendum)
 WOC Nurse wound re-consult: Refer to initial WOC consult on 4/22.  Pt requested an in-person visit to discuss plan of care.  Left lower leg with generalized edema and erythremia surrounding the wound; inner full thickness wound 6X6cm is 80% tightly adhered eschar, 15% yellow slough, 5% red and moist. No odor, drainage, or fluctuance. Pt is currently on systemic antibiotics to treat the cellulitis. Discussed plan of care and demonstrated dressing change procedure for after discharge.   Explained that the eschar is tightly adhered and will require 1-2 weeks of enzymatic debridement before or if sharp debridement of nonviable tissue is indicated.  Pt could benefit from follow-up at the outpatient wound care center; primary team please order if desired.  Discussed with patient that warm showers will assist with softening the nonviable tissue when he is approved to do this.  Dressing procedure/placement/frequency: Topical treatment orders have already been requested for bedside nurses to perform as follows: Cleanse L lower leg wound with Vashe wound cleanser Timm Foot [phone]), do not rinse and allow to air dry. Apply Medihoney to wound bed daily, cover with foam dressing and change foam dressing Q 3 days or PRN soiling Please re-consult if further assistance is needed.  Thank-you,  Wiliam Harder MSN, RN, CWOCN, [place], CNS [phone]

## 2024-02-16 NOTE — Hospital Course (Signed)
 Joshua Conner is a 53 y.o. male with a history of diabetes mellitus type 2, hyperlipidemia, seizures, GERD, obesity.  Patient presented secondary to leg pain secondary to worsening left leg cellulitis.  Patient started empirically on vancomycin  and ceftriaxone .  Slow improvement in cellulitis.  Wound care consulted.  ID consulted secondary to complicated infection.

## 2024-02-16 NOTE — Plan of Care (Signed)
  Problem: Education: Goal: Knowledge of General Education information will improve Description: Including pain rating scale, medication(s)/side effects and non-pharmacologic comfort measures Outcome: Progressing   Problem: Health Behavior/Discharge Planning: Goal: Ability to manage health-related needs will improve Outcome: Progressing   Problem: Clinical Measurements: Goal: Ability to maintain clinical measurements within normal limits will improve Outcome: Progressing Goal: Will remain free from infection Outcome: Progressing Goal: Diagnostic test results will improve Outcome: Progressing   Problem: Activity: Goal: Risk for activity intolerance will decrease Outcome: Progressing   Problem: Elimination: Goal: Will not experience complications related to bowel motility Outcome: Progressing   Problem: Pain Managment: Goal: General experience of comfort will improve and/or be controlled Outcome: Progressing   Problem: Safety: Goal: Ability to remain free from injury will improve Outcome: Progressing   Problem: Skin Integrity: Goal: Risk for impaired skin integrity will decrease Outcome: Progressing   Problem: Education: Goal: Ability to describe self-care measures that may prevent or decrease complications (Diabetes Survival Skills Education) will improve Outcome: Progressing Goal: Individualized Educational Video(s) Outcome: Progressing   Problem: Coping: Goal: Ability to adjust to condition or change in health will improve Outcome: Progressing   Problem: Fluid Volume: Goal: Ability to maintain a balanced intake and output will improve Outcome: Progressing   Problem: Health Behavior/Discharge Planning: Goal: Ability to identify and utilize available resources and services will improve Outcome: Progressing Goal: Ability to manage health-related needs will improve Outcome: Progressing   Problem: Metabolic: Goal: Ability to maintain appropriate glucose levels  will improve Outcome: Progressing   Problem: Nutritional: Goal: Maintenance of adequate nutrition will improve Outcome: Progressing Goal: Progress toward achieving an optimal weight will improve Outcome: Progressing   Problem: Skin Integrity: Goal: Risk for impaired skin integrity will decrease Outcome: Progressing   Problem: Tissue Perfusion: Goal: Adequacy of tissue perfusion will improve Outcome: Progressing

## 2024-02-16 NOTE — Progress Notes (Signed)
 PROGRESS NOTE    Joshua Conner  ION:[id number] DOB: 05/29/1971 DOA: 02/14/2024 PCP: Jimmey Mould, MD   Brief Narrative: Joshua Conner is a 53 y.o. male with a history of diabetes mellitus type 2, hyperlipidemia, seizures, GERD, obesity.  Patient presented secondary to leg pain secondary to worsening left leg cellulitis.  Patient started empirically on vancomycin  and ceftriaxone .  Slow improvement in cellulitis.  Wound care consulted.  ID consulted secondary to complicated infection.   Assessment and Plan:  Left lower extremity cellulitis Presented after physical injury after a soft tissue injury.  Progressively worsening wound.  Patient was initially managed with cefadroxil  and doxycycline  as an outpatient with worsening symptoms.  Patient started empirically on ceftriaxone  and vancomycin  on admission with slow improvement of erythema.  Infectious ease consulted with recommendations for linezolid  and Augmentin .  History of seizures Noted.  Diabetes mellitus type 2 Well-controlled based on hemoglobin A1c of 6.3%. -Continue SSI  Hyperlipidemia -Continue Crestor   GERD -Continue Protonix   Obesity, class III Estimated body mass index is 41 kg/m as calculated from the following:   Height as of this encounter: 6' (1.829 m).   Weight as of this encounter: 137.1 kg.   DVT prophylaxis: Heparin  subcu Code Status:   Code Status: Full Code Family Communication: None Disposition Plan: Discharge home likely in 2 to 3 days pending ongoing improvement in erythema.   Consultants:  Infectious disease  Procedures:  None  Antimicrobials: Vancomycin  Ceftriaxone  Linezolid  Augmentin    Subjective: Patient reports some numbness around his leg wound/erythema.  No other concerns at this time.  Objective: BP 120/69 (BP Location: Right Arm)   Pulse 78   Temp 97.9 F (36.6 C) (Oral)   Resp 14   Ht 6' (1.829 m)   Wt (!) 137.1 kg   SpO2 98%   BMI 41.00 kg/m    Examination:  General exam: Appears calm and comfortable Respiratory system: Clear to auscultation. Respiratory effort normal. Cardiovascular system: S1 & S2 heard, RRR. No murmurs, rubs, gallops or clicks. Gastrointestinal system: Abdomen is nondistended, soft and nontender. Central nervous system: Alert and oriented. No focal neurological deficits. Skin: Left leg wound significant for areas of necrosis with surrounding erythema.  Wound edges are rounded. Psychiatry: Judgement and insight appear normal. Mood & affect appropriate.    Data Reviewed: I have personally reviewed following labs and imaging studies  CBC Lab Results  Component Value Date   WBC 6.6 02/16/2024   RBC 4.68 02/16/2024   HGB 13.9 02/16/2024   HCT 44.4 02/16/2024   MCV 94.9 02/16/2024   MCH 29.7 02/16/2024   PLT 354 02/16/2024   MCHC 31.3 02/16/2024   RDW 12.9 02/16/2024   LYMPHSABS 1.1 02/16/2024   MONOABS 0.5 02/16/2024   EOSABS 0.1 02/16/2024   BASOSABS 0.1 02/16/2024     Last metabolic panel Lab Results  Component Value Date   NA 137 02/16/2024   K 4.0 02/16/2024   CL 102 02/16/2024   CO2 23 02/16/2024   BUN 20 02/16/2024   CREATININE 1.16 02/16/2024   GLUCOSE 108 (H) 02/16/2024   GFRNONAA >60 02/16/2024   GFRAA 82 (L) 01/29/2015   CALCIUM  9.2 02/16/2024   PROT 7.1 02/14/2024   ALBUMIN 3.9 02/14/2024   BILITOT 0.9 02/14/2024   ALKPHOS 65 02/14/2024   AST 16 02/14/2024   ALT 22 02/14/2024   ANIONGAP 12 02/16/2024    GFR: Estimated Creatinine Clearance: 106.8 mL/min (by C-G formula based on SCr of 1.16 mg/dL).  Recent Results (from the past 240 hours)  Culture, blood (routine x 2)     Status: None (Preliminary result)   Collection Time: 02/14/24  1:40 AM   Specimen: BLOOD  Result Value Ref Range Status   Specimen Description   Final    BLOOD RIGHT ANTECUBITAL Performed at [facility], [address] [place], [place] [address]    Special Requests   Final     BOTTLES DRAWN AEROBIC AND ANAEROBIC Blood Culture adequate volume Performed at [facility], [address] [place], [place] [address]    Culture   Final    NO GROWTH 2 DAYS Performed at [facility], [address] [place], [place] [address]    Report Status PENDING  Incomplete  Culture, blood (routine x 2)     Status: None (Preliminary result)   Collection Time: 02/14/24  1:41 AM   Specimen: BLOOD  Result Value Ref Range Status   Specimen Description   Final    BLOOD LEFT ANTECUBITAL Performed at [facility], [address] [place], [place] [address]    Special Requests   Final    BOTTLES DRAWN AEROBIC AND ANAEROBIC Blood Culture adequate volume Performed at [facility], [address] [place], [place] [address]    Culture   Final    NO GROWTH 2 DAYS Performed at [facility], [address], [place], [place] [address]    Report Status PENDING  Incomplete      Radiology Studies: CT TIBIA FIBULA LEFT W CONTRAST Result Date: 02/15/2024 CLINICAL DATA:  Soft tissue infection suspected, lower leg, no prior imaging Left lower extremity cellulitis with failed outpatient oral antibiotic therapy. EXAM: CT OF THE LOWER LEFT EXTREMITY WITH CONTRAST TECHNIQUE: Multidetector CT imaging of the left lower leg was performed according to the standard protocol following intravenous contrast administration. RADIATION DOSE REDUCTION: This exam was performed according to the departmental dose-optimization program which includes automated exposure control, adjustment of the mA and/or kV according to patient size and/or use of iterative reconstruction technique. CONTRAST:  OMNIPAQUE  IOHEXOL  300 MG/ML  SOLN COMPARISON:  Radiographs 02/11/2024. FINDINGS: Bones/Joint/Cartilage No evidence of acute fracture, dislocation or periosteal reaction. No significant arthropathic changes or effusions at the left knee or ankle. Ligaments Suboptimally assessed by  CT. Muscles and Tendons The lower leg muscles and tendons appear unremarkable. No intramuscular fluid collection or suspicious enhancement identified. There is no focal muscular atrophy. The ankle tendons appear intact without significant tenosynovitis. Soft tissues There is skin thickening anteromedially in the mid lower leg with an ill-defined area of increased density in the underlying subcutaneous fat measuring approximately 7.1 x 1.7 x 10.8 cm. No organized fluid collection or associated peripheral enhancement. No evidence of associated foreign body or soft tissue emphysema. No acute vascular findings are identified in the left lower leg. IMPRESSION: 1. Skin thickening anteromedially in the mid lower leg with an ill-defined area of increased density in the underlying subcutaneous fat, consistent with cellulitis. No organized fluid collection or associated peripheral enhancement to suggest abscess at this time. 2. No evidence of deep soft tissue infection. 3. No evidence of osteomyelitis or joint effusion at the ankle or knee. Electronically Signed   By: Elmon Hagedorn M.D.   On: 02/15/2024 15:25      LOS: 2 days    [name], MD [facility] 02/16/2024, 12:40 PM   If 7PM-7AM, please contact  night-coverage [email or web address]

## 2024-02-16 NOTE — Consult Note (Signed)
 [facility]    Date of Admission:  02/14/2024     Reason for Consult: cellulitis    Referring Provider: [name]     Lines:  Peripheral iv's  Abx: 4/21-c vanc 4/21-c ceftriaxone         Assessment: 53 y.o. male dm2, gerd hx seizure, admitted 4/21 for lle cellulitis failling outpatient abx  Didn't respond to 4 days doxy/cefadroxil   Changes in skin also suggest pyoderma or vasculitis although cellulitis changes are receding   Plan: Change to oral abx linezolid /augmentin  If no improvement need to r/o pyoderma or vasculitis Maintain standard isolation precaution Discussed with primary team     ------------------------------------------------ Principal Problem:   Cellulitis of left lower extremity    HPI: Joshua Conner is a 53 y.o. 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 Cancer - no hx of Connective tissue disease - no hx of   Doing well at good baseline health prior to this celllitis episode   W/u here Hiv screen negative 4/21 bcx ngtd  Aki improving cr 1.6 --> 1.1 Lft normal Mild leukocytosis 12 normalized  Ct showed no osseous abnormality or abscess   Family History  Problem Relation Age of Onset   Hypertension Mother    COPD Mother    Cancer Mother 71       breast   Hypertension Father    Hypertension Sister     Social History   Tobacco Use   Smoking status: Never  Substance Use Topics   Alcohol use: Yes    Alcohol/week: 12.0 standard drinks of alcohol    Types: 12 Cans of beer per week    Comment: approx 6 beers a week   Drug use: No    No Known Allergies  Review of Systems: ROS All Other ROS  was negative, except mentioned above   Past Medical History:  Diagnosis Date   Acid reflux    Seizures (HCC)    Varicose veins        Scheduled Meds:  heparin   5,000 Units Subcutaneous Q8H   insulin  aspart  0-20 Units Subcutaneous TID WC   insulin  aspart  0-5 Units Subcutaneous QHS   lamoTRIgine   250 mg Oral Daily   leptospermum manuka honey  1 Application Topical Daily   pantoprazole   40 mg Oral Daily   rosuvastatin   10 mg Oral q AM   Continuous Infusions:  cefTRIAXone  (ROCEPHIN )  IV 2 g (02/15/24 2215)   vancomycin  1,250 mg (02/16/24 1212)   PRN Meds:.acetaminophen  **OR** acetaminophen , morphine  injection, ondansetron  **OR** ondansetron  (ZOFRAN ) IV, oxyCODONE -acetaminophen , senna-docusate   OBJECTIVE: Blood pressure 120/69, pulse 78, temperature 97.9 F (36.6 C), temperature source Oral, resp. rate 14, height 6' (1.829 m), weight (!) 137.1 kg, SpO2 98%.  Physical Exam  General/constitutional: no distress, pleasant HEENT: Normocephalic, PER, Conj Clear, EOMI, Oropharynx clear Neck supple CV: rrr no mrg Lungs: clear to auscultation, normal respiratory effort Abd: Soft, Nontender Ext: no edema Skin: receding erythematous changes from 4/21 drawn boundary; tender around the necrotic skin; no fluctuance; warm to touch  4/21 picture    Neuro: nonfocal MSK: no peripheral joint swelling/tenderness/warmth; back spines nontender  Lab Results Lab Results  Component Value Date   WBC 6.6 02/16/2024   HGB 13.9 02/16/2024   HCT 44.4 02/16/2024   MCV 94.9 02/16/2024   PLT 354 02/16/2024    Lab Results  Component Value Date   CREATININE 1.16 02/16/2024   BUN 20 02/16/2024   NA 137 02/16/2024   K 4.0 02/16/2024   CL 102 02/16/2024   CO2 23 02/16/2024    Lab Results  Component Value Date   ALT 22 02/14/2024   AST 16 02/14/2024   ALKPHOS 65 02/14/2024   BILITOT 0.9 02/14/2024      Microbiology: Recent Results (from the past 240 hours)  Culture, blood  (routine x 2)     Status: None (Preliminary result)   Collection Time: 02/14/24  1:40 AM   Specimen: BLOOD  Result Value Ref Range Status   Specimen Description   Final    BLOOD RIGHT ANTECUBITAL Performed at [facility], [address] [place], [place] [address]    Special Requests   Final    BOTTLES DRAWN AEROBIC AND ANAEROBIC Blood Culture adequate volume Performed at [facility], [address] [place], [place] [address]    Culture   Final    NO GROWTH 2 DAYS Performed at [facility], [address], [place], [place] [address]    Report Status PENDING  Incomplete  Culture, blood (routine x 2)     Status: None (Preliminary result)   Collection Time: 02/14/24  1:41 AM   Specimen: BLOOD  Result Value Ref Range Status   Specimen Description   Final    BLOOD LEFT ANTECUBITAL Performed at [facility], [address] [place], [place] [address]    Special Requests   Final    BOTTLES DRAWN AEROBIC AND ANAEROBIC Blood Culture adequate volume Performed at [facility], [address] [place], [place] [address]    Culture   Final    NO GROWTH 2 DAYS Performed at [facility], [address], [place], [place] [address]    Report Status PENDING  Incomplete     Serology:    Imaging: If present, new imagings (plain films, ct scans, and mri) have been personally visualized and interpreted; radiology reports have been reviewed. Decision making incorporated into the Impression / Recommendations.  4/22 ct tib fib left lower ext 1. Skin thickening anteromedially in the mid lower leg with an ill-defined area of increased density in the underlying subcutaneous fat, consistent with cellulitis. No organized fluid collection or associated peripheral enhancement to suggest abscess at this time. 2. No evidence of deep soft tissue infection. 3. No evidence of osteomyelitis or joint effusion at the ankle or knee.  Jamesetta Mcbride,  MD [facility] [phone] pager    02/16/2024, 12:32 PM

## 2024-02-17 ENCOUNTER — Other Ambulatory Visit (HOSPITAL_COMMUNITY): Payer: Self-pay

## 2024-02-17 ENCOUNTER — Telehealth (HOSPITAL_COMMUNITY): Payer: Self-pay | Admitting: Pharmacy Technician

## 2024-02-17 DIAGNOSIS — L03116 Cellulitis of left lower limb: Secondary | ICD-10-CM | POA: Diagnosis not present

## 2024-02-17 LAB — GLUCOSE, CAPILLARY
Glucose-Capillary: 122 mg/dL — ABNORMAL HIGH (ref 70–99)
Glucose-Capillary: 131 mg/dL — ABNORMAL HIGH (ref 70–99)
Glucose-Capillary: 125 mg/dL — ABNORMAL HIGH (ref 70–99)
Glucose-Capillary: 168 mg/dL — ABNORMAL HIGH (ref 70–99)

## 2024-02-17 NOTE — Telephone Encounter (Signed)
 Patient Product/process development scientist completed.    The patient is insured through [employer]. Patient has [employer], may use a copay card, and/or apply for patient assistance if available.    Ran test claim for linezolid  600 mg tablets and the current 12 day co-pay is $0.00.   This test claim was processed through [facility]- copay amounts may vary at other pharmacies due to pharmacy/plan contracts, or as the patient moves through the different stages of their insurance plan.     Morgan Arab, CPHT Pharmacy Technician III Certified Patient Advocate [facility] Patient Advocate Team Direct Number: [phone]  Fax: [phone]

## 2024-02-17 NOTE — Progress Notes (Signed)
   02/17/24 1508  TOC Brief Assessment  Insurance and Status Reviewed  Patient has primary care physician Yes Avanell Bob, Laretta Pleasure, MD)  Home environment has been reviewed yes from home  Prior level of function: Independent  Prior/Current Home Services No current home services  Social Drivers of Health Review SDOH reviewed no interventions necessary  Readmission risk has been reviewed Yes  Transition of care needs no transition of care needs at this time

## 2024-02-17 NOTE — Progress Notes (Signed)
 PROGRESS NOTE    Joshua Conner  ZOX:[id number] DOB: July 05, 1971 DOA: 02/14/2024 PCP: Jimmey Mould, MD   Brief Narrative: Joshua Conner is a 53 y.o. male with a history of diabetes mellitus type 2, hyperlipidemia, seizures, GERD, obesity.  Patient presented secondary to leg pain secondary to worsening left leg cellulitis.  Patient started empirically on vancomycin  and ceftriaxone .  Slow improvement in cellulitis.  Wound care consulted.  ID consulted secondary to complicated infection. Patient transitioned to Linezolid  and Augmentin .   Assessment and Plan:  Left lower extremity cellulitis Presented after physical injury after a soft tissue injury.  Progressively worsening wound.  Patient was initially managed with cefadroxil  and doxycycline  as an outpatient with worsening symptoms.  Patient started empirically on ceftriaxone  and vancomycin  on admission with slow improvement of erythema.  Infectious ease consulted with recommendations for linezolid  and Augmentin . Final ID recommendations to continue a 14 day course of current antibiotics with ID clinic follow-up in 3 weeks.  History of seizures Noted.  Diabetes mellitus type 2 Well-controlled based on hemoglobin A1c of 6.3%. -Continue SSI  Hyperlipidemia -Continue Crestor   GERD -Continue Protonix   Obesity, class III Estimated body mass index is 41 kg/m as calculated from the following:   Height as of this encounter: 6' (1.829 m).   Weight as of this encounter: 137.1 kg.   DVT prophylaxis: Heparin  subcu Code Status:   Code Status: Full Code Family Communication: None Disposition Plan: Discharge home in 24 hours per ID recommendations.   Consultants:  Infectious disease  Procedures:  None  Antimicrobials: Vancomycin  Ceftriaxone  Linezolid  Augmentin    Subjective: Leg is feeling better. No concerns from overnight.  Objective: BP 123/89 (BP Location: Right Arm)   Pulse 77   Temp (!) 97.4 F (36.3 C)  (Oral)   Resp 16   Ht 6' (1.829 m)   Wt (!) 137.1 kg   SpO2 99%   BMI 41.00 kg/m   Examination:  General exam: Appears calm and comfortable Respiratory system: Clear to auscultation. Respiratory effort normal. Cardiovascular system: S1 & S2 heard, RRR. Gastrointestinal system: Abdomen is nondistended, soft and nontender. Normal bowel sounds heard. Central nervous system: Alert and oriented. No focal neurological deficits. Musculoskeletal: No edema. No calf tenderness Skin: Erythema continues to appear receded from initial markings Psychiatry: Judgement and insight appear normal. Mood & affect appropriate.    Data Reviewed: I have personally reviewed following labs and imaging studies  CBC Lab Results  Component Value Date   WBC 6.6 02/16/2024   RBC 4.68 02/16/2024   HGB 13.9 02/16/2024   HCT 44.4 02/16/2024   MCV 94.9 02/16/2024   MCH 29.7 02/16/2024   PLT 354 02/16/2024   MCHC 31.3 02/16/2024   RDW 12.9 02/16/2024   LYMPHSABS 1.1 02/16/2024   MONOABS 0.5 02/16/2024   EOSABS 0.1 02/16/2024   BASOSABS 0.1 02/16/2024     Last metabolic panel Lab Results  Component Value Date   NA 137 02/16/2024   K 4.0 02/16/2024   CL 102 02/16/2024   CO2 23 02/16/2024   BUN 20 02/16/2024   CREATININE 1.16 02/16/2024   GLUCOSE 108 (H) 02/16/2024   GFRNONAA >60 02/16/2024   GFRAA 82 (L) 01/29/2015   CALCIUM  9.2 02/16/2024   PROT 7.1 02/14/2024   ALBUMIN 3.9 02/14/2024   BILITOT 0.9 02/14/2024   ALKPHOS 65 02/14/2024   AST 16 02/14/2024   ALT 22 02/14/2024   ANIONGAP 12 02/16/2024    GFR: Estimated Creatinine Clearance: 106.8 mL/min (  by C-G formula based on SCr of 1.16 mg/dL).  Recent Results (from the past 240 hours)  Culture, blood (routine x 2)     Status: None (Preliminary result)   Collection Time: 02/14/24  1:40 AM   Specimen: BLOOD  Result Value Ref Range Status   Specimen Description   Final    BLOOD RIGHT ANTECUBITAL Performed at [facility],  [address] [place], [place] [address]    Special Requests   Final    BOTTLES DRAWN AEROBIC AND ANAEROBIC Blood Culture adequate volume Performed at [facility], [address] [place], [place] [address]    Culture   Final    NO GROWTH 3 DAYS Performed at [facility], [address], [place], [place] [address]    Report Status PENDING  Incomplete  Culture, blood (routine x 2)     Status: None (Preliminary result)   Collection Time: 02/14/24  1:41 AM   Specimen: BLOOD  Result Value Ref Range Status   Specimen Description   Final    BLOOD LEFT ANTECUBITAL Performed at [facility], [address] [place], [place] [address]    Special Requests   Final    BOTTLES DRAWN AEROBIC AND ANAEROBIC Blood Culture adequate volume Performed at [facility], [address] [place], [place] [address]    Culture   Final    NO GROWTH 3 DAYS Performed at [facility], [address] [place], [place] [address]    Report Status PENDING  Incomplete      Radiology Studies: No results found.     LOS: 3 days    [name], MD [facility] 02/17/2024, 2:48 PM   If 7PM-7AM, please contact night-coverage [email or web address]

## 2024-02-17 NOTE — Progress Notes (Signed)
 [facility]  Date of Admission:  02/14/2024       Abx: 4/23-c linezolid  and augmentin   ASSESSMENT: 53 yo male with dm2 here with lle cellulitis after a piece of granite fell and scraped his shin area    PLAN: Continue linezolid  and augmentin  Tomorrow if continued improvement then discharge ok from id standpoint and would finish residual of the 14 day planned linezolid /augmentin  Appreciate wound care input -- will need wound care outpatient as well Id clinic f/u 5/14 @ 945 Maintain standard isolation precaution Id will sign off Discussed with primary team  Principal Problem:   Cellulitis of left lower extremity   No Known Allergies  Scheduled Meds:  amoxicillin -clavulanate  1 tablet Oral Q12H   heparin   5,000 Units Subcutaneous Q8H   insulin  aspart  0-20 Units Subcutaneous TID WC   insulin  aspart  0-5 Units Subcutaneous QHS   lamoTRIgine   250 mg Oral Daily   leptospermum manuka honey  1 Application Topical Daily   linezolid   600 mg Oral Q12H   pantoprazole   40 mg Oral Daily   rosuvastatin   10 mg Oral q AM   Continuous Infusions: PRN Meds:.acetaminophen  **OR** acetaminophen , morphine  injection, ondansetron  **OR** ondansetron  (ZOFRAN ) IV, oxyCODONE -acetaminophen , senna-docusate   SUBJECTIVE: Pain better He thinks the rash is improving Wound care seeing No fever, chill No n/v/diarrhea   Review of Systems: ROS All other ROS was negative, except mentioned above     OBJECTIVE: Vitals:   02/16/24 0449 02/16/24 1341 02/16/24 2100 02/17/24 0545  BP: 120/69 (!) 142/84 132/83 123/84  Pulse: 78 83 81 72  Resp: 14 20 16 16   Temp: 97.9 F (36.6 C) (!) 97.5 F (36.4 C) 98.2 F (36.8 C) 97.7 F (36.5 C)  TempSrc: Oral Oral Oral Oral  SpO2: 98% 98% 98% 96%  Weight:      Height:       Body mass index is 41 kg/m.  Physical Exam  General/constitutional: no distress, pleasant HEENT: Normocephalic, PER, Conj Clear,  EOMI, Oropharynx clear Neck supple CV: rrr no mrg Lungs: clear to auscultation, normal respiratory effort Abd: Soft, Nontender Ext: no edema Skin: decreased swelling and redness intensity and involvement   Lab Results Lab Results  Component Value Date   WBC 6.6 02/16/2024   HGB 13.9 02/16/2024   HCT 44.4 02/16/2024   MCV 94.9 02/16/2024   PLT 354 02/16/2024    Lab Results  Component Value Date   CREATININE 1.16 02/16/2024   BUN 20 02/16/2024   NA 137 02/16/2024   K 4.0 02/16/2024   CL 102 02/16/2024   CO2 23 02/16/2024    Lab Results  Component Value Date   ALT 22 02/14/2024   AST 16 02/14/2024   ALKPHOS 65 02/14/2024   BILITOT 0.9 02/14/2024      Microbiology: Recent Results (from the past 240 hours)  Culture, blood (routine x 2)     Status: None (Preliminary result)   Collection Time: 02/14/24  1:40 AM   Specimen: BLOOD  Result Value Ref Range Status   Specimen Description   Final    BLOOD RIGHT ANTECUBITAL Performed at [facility], [address] [place], [place] [address]    Special Requests   Final    BOTTLES DRAWN AEROBIC AND ANAEROBIC Blood Culture adequate volume Performed at [facility], [address] [place], [place] [address]    Culture   Final  NO GROWTH 3 DAYS Performed at [facility], [address] [place], [place] [address]    Report Status PENDING  Incomplete  Culture, blood (routine x 2)     Status: None (Preliminary result)   Collection Time: 02/14/24  1:41 AM   Specimen: BLOOD  Result Value Ref Range Status   Specimen Description   Final    BLOOD LEFT ANTECUBITAL Performed at [facility], [address] [place], [place] [address]    Special Requests   Final    BOTTLES DRAWN AEROBIC AND ANAEROBIC Blood Culture adequate volume Performed at [facility], [address] [place], [place] [address]    Culture   Final    NO GROWTH 3 DAYS Performed at [facility],  [address] [place], [place] [address]    Report Status PENDING  Incomplete     Serology:   Imaging: If present, new imagings (plain films, ct scans, and mri) have been personally visualized and interpreted; radiology reports have been reviewed. Decision making incorporated into the Impression / Recommendations.   Jamesetta Mcbride, MD [facility] [phone] pager    02/17/2024, 1:12 PM

## 2024-02-17 NOTE — Plan of Care (Signed)
  Problem: Education: Goal: Knowledge of General Education information will improve Description: Including pain rating scale, medication(s)/side effects and non-pharmacologic comfort measures Outcome: Progressing   Problem: Health Behavior/Discharge Planning: Goal: Ability to manage health-related needs will improve Outcome: Progressing   Problem: Clinical Measurements: Goal: Ability to maintain clinical measurements within normal limits will improve Outcome: Progressing Goal: Will remain free from infection Outcome: Progressing Goal: Diagnostic test results will improve Outcome: Progressing   Problem: Activity: Goal: Risk for activity intolerance will decrease Outcome: Progressing   Problem: Elimination: Goal: Will not experience complications related to bowel motility Outcome: Progressing   Problem: Pain Managment: Goal: General experience of comfort will improve and/or be controlled Outcome: Progressing   Problem: Safety: Goal: Ability to remain free from injury will improve Outcome: Progressing   Problem: Skin Integrity: Goal: Risk for impaired skin integrity will decrease Outcome: Progressing   Problem: Education: Goal: Ability to describe self-care measures that may prevent or decrease complications (Diabetes Survival Skills Education) will improve Outcome: Progressing Goal: Individualized Educational Video(s) Outcome: Progressing   Problem: Coping: Goal: Ability to adjust to condition or change in health will improve Outcome: Progressing   Problem: Fluid Volume: Goal: Ability to maintain a balanced intake and output will improve Outcome: Progressing   Problem: Health Behavior/Discharge Planning: Goal: Ability to identify and utilize available resources and services will improve Outcome: Progressing Goal: Ability to manage health-related needs will improve Outcome: Progressing   Problem: Metabolic: Goal: Ability to maintain appropriate glucose levels  will improve Outcome: Progressing   Problem: Nutritional: Goal: Maintenance of adequate nutrition will improve Outcome: Progressing Goal: Progress toward achieving an optimal weight will improve Outcome: Progressing   Problem: Skin Integrity: Goal: Risk for impaired skin integrity will decrease Outcome: Progressing   Problem: Tissue Perfusion: Goal: Adequacy of tissue perfusion will improve Outcome: Progressing

## 2024-02-18 ENCOUNTER — Other Ambulatory Visit (HOSPITAL_COMMUNITY): Payer: Self-pay

## 2024-02-18 DIAGNOSIS — E785 Hyperlipidemia, unspecified: Secondary | ICD-10-CM | POA: Insufficient documentation

## 2024-02-18 DIAGNOSIS — L03116 Cellulitis of left lower limb: Secondary | ICD-10-CM | POA: Diagnosis not present

## 2024-02-18 DIAGNOSIS — E119 Type 2 diabetes mellitus without complications: Secondary | ICD-10-CM

## 2024-02-18 LAB — GLUCOSE, CAPILLARY: Glucose-Capillary: 137 mg/dL — ABNORMAL HIGH (ref 70–99)

## 2024-02-18 MED ORDER — LINEZOLID 600 MG PO TABS
600.0000 mg | ORAL_TABLET | Freq: Two times a day (BID) | ORAL | 0 refills | Status: AC
  Filled 2024-02-18: qty 24, 12d supply, fill #0

## 2024-02-18 MED ORDER — AMOXICILLIN-POT CLAVULANATE 875-125 MG PO TABS
1.0000 | ORAL_TABLET | Freq: Two times a day (BID) | ORAL | 0 refills | Status: AC
  Filled 2024-02-18: qty 24, 12d supply, fill #0

## 2024-02-18 NOTE — Discharge Summary (Signed)
 Physician Discharge Summary   Patient: Joshua Conner MRN: [id number] DOB: 15-May-1971  Admit date:     02/14/2024  Discharge date: 02/18/24  Discharge Physician: [name], MD   PCP: Jimmey Mould, MD   Recommendations at discharge:  PCP visit for hospital follow-up Infectious disease visit for hospital follow-up Wound care follow-up  Discharge Diagnoses: Principal Problem:   Cellulitis of left lower extremity Active Problems:   Controlled type 2 diabetes mellitus without complication, without long-term current use of insulin  (HCC)   Hyperlipidemia  Resolved Problems:   * No resolved hospital problems. *  Hospital Course: Joshua Conner is a 53 y.o. male with a history of diabetes mellitus type 2, hyperlipidemia, seizures, GERD, obesity.  Patient presented secondary to leg pain secondary to worsening left leg cellulitis.  Patient started empirically on vancomycin  and ceftriaxone .  Slow improvement in cellulitis.  Wound care consulted.  ID consulted secondary to complicated infection. Patient transitioned to Linezolid  and Augmentin .  Assessment and Plan:  Left lower extremity cellulitis Presented after physical injury after a soft tissue injury.  Progressively worsening wound.  Patient was initially managed with cefadroxil  and doxycycline  as an outpatient with worsening symptoms.  Patient started empirically on ceftriaxone  and vancomycin  on admission with slow improvement of erythema.  Infectious ease consulted with recommendations for linezolid  and Augmentin  and to complete a 14-day treatment course. Patient to follow-up with ID on discharge. Patient referred to wound care.   History of seizures Noted.   Diabetes mellitus type 2 Well-controlled based on hemoglobin A1c of 6.3%. Continue Jardiance on discharge.   Hyperlipidemia Continue Crestor    GERD Continue Protonix    Obesity, class III Estimated body mass index is 41 kg/m as calculated from the  following:   Height as of this encounter: 6' (1.829 m).   Weight as of this encounter: 137.1 kg.   Consultants:  Infectious disease   Procedures:  None  Disposition: Home Diet recommendation: Carb modified diet   DISCHARGE MEDICATION: Allergies as of 02/18/2024   No Known Allergies      Medication List     STOP taking these medications    bacitracin  500 UNIT/GM ointment   cefadroxil  500 MG capsule Commonly known as: DURICEF   chlorhexidine  4 % external liquid Commonly known as: Hibiclens    doxycycline  100 MG capsule Commonly known as: VIBRAMYCIN    meloxicam  15 MG tablet Commonly known as: MOBIC        TAKE these medications    amoxicillin -clavulanate 875-125 MG tablet Commonly known as: AUGMENTIN  Take 1 tablet by mouth 2 (two) times daily for 12 days.   Clear Eyes Contact Lens Relief Soln Place 1 drop into both eyes as needed (for dry eyes). For rewetting contact lenses.   Jardiance 25 MG Tabs tablet Generic drug: empagliflozin Take 25 mg by mouth in the morning.   LamoTRIgine  250 MG Tb24 24 hour tablet Take 250 mg by mouth in the morning.   linezolid  600 MG tablet Commonly known as: ZYVOX  Take 1 tablet (600 mg total) by mouth 2 (two) times daily for 12 days.   pantoprazole  40 MG tablet Commonly known as: PROTONIX  Take 40 mg by mouth daily before breakfast.   rosuvastatin  10 MG tablet Commonly known as: CRESTOR  Take 10 mg by mouth in the morning.   TYLENOL  500 MG tablet Generic drug: acetaminophen  Take 1,000 mg by mouth every 6 (six) hours as needed (for pain).        Follow-up Information  Jimmey Mould, MD. Schedule an appointment as soon as possible for a visit in 1 week(s).   Specialty: Family Medicine Why: For hospital follow-up Contact information: [address] [place] [address] [phone]         Orson Blalock, NP Follow up on 03/08/2024.   Specialty: Infectious Diseases Why: 9:45 AM, For  hospital follow-up Contact information: [address] [place] [address] [phone]                Discharge Exam: BP 131/75 (BP Location: Right Arm)   Pulse 84   Temp 98.3 F (36.8 C) (Oral)   Resp 18   Ht 6' (1.829 m)   Wt (!) 137.1 kg   SpO2 96%   BMI 41.00 kg/m   General exam: Appears calm and comfortable Respiratory system: Respiratory effort normal. Gastrointestinal system: Abdomen is nondistended, soft and nontender. Normal bowel sounds heard. Central nervous system: Alert and oriented. No focal neurological deficits. Musculoskeletal: Left lower leg with continued erythema receded from prior marking. Wound covered by dressing. Psychiatry: Judgement and insight appear normal. Mood & affect appropriate.   Condition at discharge: stable  The results of significant diagnostics from this hospitalization (including imaging, microbiology, ancillary and laboratory) are listed below for reference.   Imaging Studies: CT TIBIA FIBULA LEFT W CONTRAST Result Date: 02/15/2024 CLINICAL DATA:  Soft tissue infection suspected, lower leg, no prior imaging Left lower extremity cellulitis with failed outpatient oral antibiotic therapy. EXAM: CT OF THE LOWER LEFT EXTREMITY WITH CONTRAST TECHNIQUE: Multidetector CT imaging of the left lower leg was performed according to the standard protocol following intravenous contrast administration. RADIATION DOSE REDUCTION: This exam was performed according to the departmental dose-optimization program which includes automated exposure control, adjustment of the mA and/or kV according to patient size and/or use of iterative reconstruction technique. CONTRAST:  OMNIPAQUE  IOHEXOL  300 MG/ML  SOLN COMPARISON:  Radiographs 02/11/2024. FINDINGS: Bones/Joint/Cartilage No evidence of acute fracture, dislocation or periosteal reaction. No significant arthropathic changes or effusions at the left knee or ankle. Ligaments Suboptimally assessed by CT.  Muscles and Tendons The lower leg muscles and tendons appear unremarkable. No intramuscular fluid collection or suspicious enhancement identified. There is no focal muscular atrophy. The ankle tendons appear intact without significant tenosynovitis. Soft tissues There is skin thickening anteromedially in the mid lower leg with an ill-defined area of increased density in the underlying subcutaneous fat measuring approximately 7.1 x 1.7 x 10.8 cm. No organized fluid collection or associated peripheral enhancement. No evidence of associated foreign body or soft tissue emphysema. No acute vascular findings are identified in the left lower leg. IMPRESSION: 1. Skin thickening anteromedially in the mid lower leg with an ill-defined area of increased density in the underlying subcutaneous fat, consistent with cellulitis. No organized fluid collection or associated peripheral enhancement to suggest abscess at this time. 2. No evidence of deep soft tissue infection. 3. No evidence of osteomyelitis or joint effusion at the ankle or knee. Electronically Signed   By: Elmon Hagedorn M.D.   On: 02/15/2024 15:25   DG Tibia/Fibula Left Result Date: 02/11/2024 CLINICAL DATA:  Laceration to left lower leg after 800 pound piece of granite fell on leg. Leg is swollen. EXAM: LEFT TIBIA AND FIBULA - 2 VIEW COMPARISON:  None Available. FINDINGS: No acute fracture or dislocation. Question swelling about the upper posterior calf. IMPRESSION: No acute fracture or dislocation. Question swelling about the upper posterior calf. Electronically Signed   By: Herminia Lope  Stutzman M.D.   On: 02/11/2024 17:37    Microbiology: Results for orders placed or performed during the hospital encounter of 02/14/24  Culture, blood (routine x 2)     Status: None (Preliminary result)   Collection Time: 02/14/24  1:40 AM   Specimen: BLOOD  Result Value Ref Range Status   Specimen Description   Final    BLOOD RIGHT ANTECUBITAL Performed at [facility], [address] [place], [place] [address]    Special Requests   Final    BOTTLES DRAWN AEROBIC AND ANAEROBIC Blood Culture adequate volume Performed at [facility], [address], [place], [place] [address]    Culture   Final    NO GROWTH 4 DAYS Performed at [facility], [address], [place], [place] [address]    Report Status PENDING  Incomplete  Culture, blood (routine x 2)     Status: None (Preliminary result)   Collection Time: 02/14/24  1:41 AM   Specimen: BLOOD  Result Value Ref Range Status   Specimen Description   Final    BLOOD LEFT ANTECUBITAL Performed at [facility], [address] [place], [place] [address]    Special Requests   Final    BOTTLES DRAWN AEROBIC AND ANAEROBIC Blood Culture adequate volume Performed at [facility], [address] [place], [place] [address]    Culture   Final    NO GROWTH 4 DAYS Performed at [facility], [address] [place], [place] [address]    Report Status PENDING  Incomplete    Labs: CBC: Recent Labs  Lab 02/13/24 2227 02/14/24 1405 02/15/24 0614 02/16/24 0609  WBC 12.0* 8.2 7.0 6.6  NEUTROABS 9.5* 5.8  --  4.7  HGB 14.8 14.3 13.3 13.9  HCT 44.4 45.4 42.2 44.4  MCV 90.1 94.6 95.5 94.9  PLT 389 350 317 354   Basic Metabolic Panel: Recent Labs  Lab 02/13/24 2227 02/14/24 1405 02/15/24 0614 02/16/24 0609  NA 138 137 134* 137  K 4.1 3.7 4.2 4.0  CL 102 103 101 102  CO2 26 25 24 23   GLUCOSE 157* 122* 117* 108*  BUN 23* 23* 22* 20  CREATININE 1.56* 1.26* 1.20 1.16  CALCIUM  9.0 8.9 8.6* 9.2  MG  --  2.2 2.2 2.1   Liver Function Tests: Recent Labs  Lab 02/13/24 2227 02/14/24 1405  AST 17 16  ALT 24 22  ALKPHOS 74 65  BILITOT 0.9 0.9  PROT 7.5 7.1  ALBUMIN 4.0 3.9   CBG: Recent Labs  Lab 02/17/24 0722 02/17/24 1119 02/17/24 1606 02/17/24 2132 02/18/24 0731  GLUCAP 122* 131* 125* 168* 137*    Discharge time spent: 35  minutes.  Signed: [name], MD [facility] 02/18/2024

## 2024-02-18 NOTE — Discharge Instructions (Signed)
 Joshua Conner,  You were in the hospital with cellulitis and a leg wound. Your antibiotics have been adjusted and wound care instructions provided. Please follow-up with your PCP and the infectious disease doctors. Please take your medication as recommended.

## 2024-02-18 NOTE — Plan of Care (Signed)
  Problem: Education: Goal: Knowledge of General Education information will improve Description: Including pain rating scale, medication(s)/side effects and non-pharmacologic comfort measures Outcome: Progressing   Problem: Health Behavior/Discharge Planning: Goal: Ability to manage health-related needs will improve Outcome: Progressing   Problem: Clinical Measurements: Goal: Ability to maintain clinical measurements within normal limits will improve Outcome: Progressing Goal: Diagnostic test results will improve Outcome: Progressing   Problem: Activity: Goal: Risk for activity intolerance will decrease Outcome: Progressing   Problem: Pain Managment: Goal: General experience of comfort will improve and/or be controlled Outcome: Progressing   Problem: Skin Integrity: Goal: Risk for impaired skin integrity will decrease Outcome: Progressing   Problem: Tissue Perfusion: Goal: Adequacy of tissue perfusion will improve Outcome: Progressing

## 2024-02-19 LAB — CULTURE, BLOOD (ROUTINE X 2)
Culture: NO GROWTH
Culture: NO GROWTH
Special Requests: ADEQUATE
Special Requests: ADEQUATE

## 2024-02-21 ENCOUNTER — Encounter (HOSPITAL_BASED_OUTPATIENT_CLINIC_OR_DEPARTMENT_OTHER): Attending: Internal Medicine | Admitting: Internal Medicine

## 2024-02-21 DIAGNOSIS — L97828 Non-pressure chronic ulcer of other part of left lower leg with other specified severity: Secondary | ICD-10-CM | POA: Diagnosis not present

## 2024-02-21 DIAGNOSIS — S8012XD Contusion of left lower leg, subsequent encounter: Secondary | ICD-10-CM | POA: Insufficient documentation

## 2024-02-21 DIAGNOSIS — X58XXXD Exposure to other specified factors, subsequent encounter: Secondary | ICD-10-CM | POA: Diagnosis not present

## 2024-02-21 DIAGNOSIS — L03116 Cellulitis of left lower limb: Secondary | ICD-10-CM | POA: Diagnosis not present

## 2024-02-21 DIAGNOSIS — E11622 Type 2 diabetes mellitus with other skin ulcer: Secondary | ICD-10-CM | POA: Diagnosis not present

## 2024-02-24 ENCOUNTER — Encounter (HOSPITAL_BASED_OUTPATIENT_CLINIC_OR_DEPARTMENT_OTHER): Attending: Internal Medicine | Admitting: Internal Medicine

## 2024-02-24 DIAGNOSIS — S8012XD Contusion of left lower leg, subsequent encounter: Secondary | ICD-10-CM | POA: Diagnosis not present

## 2024-02-24 DIAGNOSIS — L03116 Cellulitis of left lower limb: Secondary | ICD-10-CM | POA: Insufficient documentation

## 2024-02-24 DIAGNOSIS — E11622 Type 2 diabetes mellitus with other skin ulcer: Secondary | ICD-10-CM | POA: Diagnosis present

## 2024-02-24 DIAGNOSIS — L97828 Non-pressure chronic ulcer of other part of left lower leg with other specified severity: Secondary | ICD-10-CM | POA: Insufficient documentation

## 2024-02-28 ENCOUNTER — Encounter (HOSPITAL_BASED_OUTPATIENT_CLINIC_OR_DEPARTMENT_OTHER): Admitting: Internal Medicine

## 2024-02-28 DIAGNOSIS — E11622 Type 2 diabetes mellitus with other skin ulcer: Secondary | ICD-10-CM | POA: Diagnosis not present

## 2024-02-28 DIAGNOSIS — L97828 Non-pressure chronic ulcer of other part of left lower leg with other specified severity: Secondary | ICD-10-CM

## 2024-02-28 DIAGNOSIS — S8012XD Contusion of left lower leg, subsequent encounter: Secondary | ICD-10-CM

## 2024-02-28 DIAGNOSIS — L03116 Cellulitis of left lower limb: Secondary | ICD-10-CM | POA: Diagnosis not present

## 2024-03-06 ENCOUNTER — Encounter (HOSPITAL_BASED_OUTPATIENT_CLINIC_OR_DEPARTMENT_OTHER): Admitting: Internal Medicine

## 2024-03-06 DIAGNOSIS — L03116 Cellulitis of left lower limb: Secondary | ICD-10-CM

## 2024-03-06 DIAGNOSIS — L97828 Non-pressure chronic ulcer of other part of left lower leg with other specified severity: Secondary | ICD-10-CM

## 2024-03-06 DIAGNOSIS — S8012XD Contusion of left lower leg, subsequent encounter: Secondary | ICD-10-CM

## 2024-03-06 DIAGNOSIS — A419 Sepsis, unspecified organism: Secondary | ICD-10-CM | POA: Diagnosis not present

## 2024-03-06 DIAGNOSIS — E11622 Type 2 diabetes mellitus with other skin ulcer: Secondary | ICD-10-CM

## 2024-03-07 ENCOUNTER — Inpatient Hospital Stay (HOSPITAL_COMMUNITY)
Admission: EM | Admit: 2024-03-07 | Discharge: 2024-03-16 | DRG: 854 | Disposition: A | Discharge status: Home Health | Attending: Internal Medicine | Admitting: Internal Medicine

## 2024-03-07 ENCOUNTER — Other Ambulatory Visit: Payer: Self-pay

## 2024-03-07 ENCOUNTER — Emergency Department (HOSPITAL_COMMUNITY)

## 2024-03-07 DIAGNOSIS — D72829 Elevated white blood cell count, unspecified: Secondary | ICD-10-CM | POA: Diagnosis not present

## 2024-03-07 DIAGNOSIS — L97229 Non-pressure chronic ulcer of left calf with unspecified severity: Secondary | ICD-10-CM | POA: Diagnosis present

## 2024-03-07 DIAGNOSIS — L97924 Non-pressure chronic ulcer of unspecified part of left lower leg with necrosis of bone: Secondary | ICD-10-CM | POA: Diagnosis not present

## 2024-03-07 DIAGNOSIS — E785 Hyperlipidemia, unspecified: Secondary | ICD-10-CM | POA: Diagnosis present

## 2024-03-07 DIAGNOSIS — E1122 Type 2 diabetes mellitus with diabetic chronic kidney disease: Secondary | ICD-10-CM | POA: Diagnosis present

## 2024-03-07 DIAGNOSIS — K219 Gastro-esophageal reflux disease without esophagitis: Secondary | ICD-10-CM | POA: Diagnosis present

## 2024-03-07 DIAGNOSIS — G40909 Epilepsy, unspecified, not intractable, without status epilepticus: Secondary | ICD-10-CM | POA: Diagnosis present

## 2024-03-07 DIAGNOSIS — L539 Erythematous condition, unspecified: Secondary | ICD-10-CM | POA: Diagnosis not present

## 2024-03-07 DIAGNOSIS — E782 Mixed hyperlipidemia: Secondary | ICD-10-CM | POA: Diagnosis not present

## 2024-03-07 DIAGNOSIS — Z825 Family history of asthma and other chronic lower respiratory diseases: Secondary | ICD-10-CM | POA: Diagnosis not present

## 2024-03-07 DIAGNOSIS — Z6841 Body Mass Index (BMI) 40.0 and over, adult: Secondary | ICD-10-CM

## 2024-03-07 DIAGNOSIS — L03116 Cellulitis of left lower limb: Principal | ICD-10-CM | POA: Diagnosis present

## 2024-03-07 DIAGNOSIS — Z7984 Long term (current) use of oral hypoglycemic drugs: Secondary | ICD-10-CM

## 2024-03-07 DIAGNOSIS — Z8669 Personal history of other diseases of the nervous system and sense organs: Secondary | ICD-10-CM

## 2024-03-07 DIAGNOSIS — E11622 Type 2 diabetes mellitus with other skin ulcer: Secondary | ICD-10-CM | POA: Diagnosis present

## 2024-03-07 DIAGNOSIS — A419 Sepsis, unspecified organism: Secondary | ICD-10-CM | POA: Diagnosis present

## 2024-03-07 DIAGNOSIS — E119 Type 2 diabetes mellitus without complications: Secondary | ICD-10-CM | POA: Diagnosis not present

## 2024-03-07 DIAGNOSIS — N1831 Chronic kidney disease, stage 3a: Secondary | ICD-10-CM | POA: Diagnosis present

## 2024-03-07 DIAGNOSIS — I129 Hypertensive chronic kidney disease with stage 1 through stage 4 chronic kidney disease, or unspecified chronic kidney disease: Secondary | ICD-10-CM | POA: Diagnosis present

## 2024-03-07 DIAGNOSIS — E872 Acidosis, unspecified: Secondary | ICD-10-CM | POA: Diagnosis present

## 2024-03-07 DIAGNOSIS — B952 Enterococcus as the cause of diseases classified elsewhere: Secondary | ICD-10-CM | POA: Diagnosis present

## 2024-03-07 DIAGNOSIS — M8618 Other acute osteomyelitis, other site: Secondary | ICD-10-CM | POA: Diagnosis not present

## 2024-03-07 DIAGNOSIS — M869 Osteomyelitis, unspecified: Secondary | ICD-10-CM

## 2024-03-07 DIAGNOSIS — Z8249 Family history of ischemic heart disease and other diseases of the circulatory system: Secondary | ICD-10-CM | POA: Diagnosis not present

## 2024-03-07 DIAGNOSIS — G4733 Obstructive sleep apnea (adult) (pediatric): Secondary | ICD-10-CM | POA: Diagnosis present

## 2024-03-07 DIAGNOSIS — B9689 Other specified bacterial agents as the cause of diseases classified elsewhere: Secondary | ICD-10-CM | POA: Diagnosis not present

## 2024-03-07 DIAGNOSIS — E1169 Type 2 diabetes mellitus with other specified complication: Secondary | ICD-10-CM | POA: Diagnosis not present

## 2024-03-07 DIAGNOSIS — L02416 Cutaneous abscess of left lower limb: Secondary | ICD-10-CM | POA: Diagnosis present

## 2024-03-07 DIAGNOSIS — L0390 Cellulitis, unspecified: Secondary | ICD-10-CM | POA: Diagnosis present

## 2024-03-07 DIAGNOSIS — E66813 Obesity, class 3: Secondary | ICD-10-CM | POA: Diagnosis present

## 2024-03-07 HISTORY — DX: Type 2 diabetes mellitus without complications: E11.9

## 2024-03-07 LAB — CBC WITH DIFFERENTIAL/PLATELET
Abs Immature Granulocytes: 0.05 10*3/uL (ref 0.00–0.07)
Basophils Absolute: 0.1 10*3/uL (ref 0.0–0.1)
Basophils Relative: 0 %
Eosinophils Absolute: 0.1 10*3/uL (ref 0.0–0.5)
Eosinophils Relative: 1 %
HCT: 43.3 % (ref 39.0–52.0)
Hemoglobin: 14.1 g/dL (ref 13.0–17.0)
Immature Granulocytes: 0 %
Lymphocytes Relative: 11 %
Lymphs Abs: 1.6 10*3/uL (ref 0.7–4.0)
MCH: 29.7 pg (ref 26.0–34.0)
MCHC: 32.6 g/dL (ref 30.0–36.0)
MCV: 91.4 fL (ref 80.0–100.0)
Monocytes Absolute: 0.7 10*3/uL (ref 0.1–1.0)
Monocytes Relative: 5 %
Neutro Abs: 11.6 10*3/uL — ABNORMAL HIGH (ref 1.7–7.7)
Neutrophils Relative %: 83 %
Platelets: 271 10*3/uL (ref 150–400)
RBC: 4.74 MIL/uL (ref 4.22–5.81)
RDW: 13.8 % (ref 11.5–15.5)
WBC: 14.1 10*3/uL — ABNORMAL HIGH (ref 4.0–10.5)
nRBC: 0 % (ref 0.0–0.2)

## 2024-03-07 LAB — COMPREHENSIVE METABOLIC PANEL WITH GFR
ALT: 30 U/L (ref 0–44)
AST: 15 U/L (ref 15–41)
Albumin: 4 g/dL (ref 3.5–5.0)
Alkaline Phosphatase: 67 U/L (ref 38–126)
Anion gap: 12 (ref 5–15)
BUN: 16 mg/dL (ref 6–20)
CO2: 21 mmol/L — ABNORMAL LOW (ref 22–32)
Calcium: 9 mg/dL (ref 8.9–10.3)
Chloride: 107 mmol/L (ref 98–111)
Creatinine, Ser: 1.3 mg/dL — ABNORMAL HIGH (ref 0.61–1.24)
GFR, Estimated: >60 mL/min (ref >60)
Glucose, Bld: 218 mg/dL — ABNORMAL HIGH (ref 70–99)
Potassium: 3.6 mmol/L (ref 3.5–5.1)
Sodium: 140 mmol/L (ref 135–145)
Total Bilirubin: 0.9 mg/dL (ref 0.0–1.2)
Total Protein: 7.1 g/dL (ref 6.5–8.1)

## 2024-03-07 LAB — I-STAT CG4 LACTIC ACID, ED: Lactic Acid, Venous: 1.4 mmol/L (ref 0.5–1.9)

## 2024-03-07 LAB — PROTIME-INR
INR: 1 (ref 0.8–1.2)
Prothrombin Time: 13.2 s (ref 11.4–15.2)

## 2024-03-07 MED ORDER — VANCOMYCIN HCL 2000 MG/400ML IV SOLN
2000.0000 mg | Freq: Once | INTRAVENOUS | Status: AC
  Administered 2024-03-08: 2000 mg via INTRAVENOUS
  Filled 2024-03-07: qty 400

## 2024-03-07 MED ORDER — SODIUM CHLORIDE 0.9 % IV BOLUS
1000.0000 mL | Freq: Once | INTRAVENOUS | Status: AC
  Administered 2024-03-07: 1000 mL via INTRAVENOUS

## 2024-03-07 MED ORDER — MORPHINE SULFATE (PF) 4 MG/ML IV SOLN
4.0000 mg | Freq: Once | INTRAVENOUS | Status: AC
  Administered 2024-03-07: 4 mg via INTRAVENOUS
  Filled 2024-03-07: qty 1

## 2024-03-07 MED ORDER — INSULIN ASPART 100 UNIT/ML IJ SOLN
0.0000 [IU] | INTRAMUSCULAR | Status: DC
  Administered 2024-03-08: 2 [IU] via SUBCUTANEOUS
  Administered 2024-03-08 – 2024-03-09 (×5): 1 [IU] via SUBCUTANEOUS
  Administered 2024-03-09: 2 [IU] via SUBCUTANEOUS
  Administered 2024-03-10: 3 [IU] via SUBCUTANEOUS
  Administered 2024-03-10: 2 [IU] via SUBCUTANEOUS
  Administered 2024-03-10 (×2): 1 [IU] via SUBCUTANEOUS
  Administered 2024-03-11: 2 [IU] via SUBCUTANEOUS
  Administered 2024-03-11 (×2): 3 [IU] via SUBCUTANEOUS
  Administered 2024-03-11 – 2024-03-12 (×4): 2 [IU] via SUBCUTANEOUS
  Administered 2024-03-12 (×2): 1 [IU] via SUBCUTANEOUS
  Administered 2024-03-13: 2 [IU] via SUBCUTANEOUS
  Filled 2024-03-07: qty 0.09

## 2024-03-07 MED ORDER — PIPERACILLIN-TAZOBACTAM 3.375 G IVPB 30 MIN
3.3750 g | Freq: Once | INTRAVENOUS | Status: AC
  Administered 2024-03-07: 3.375 g via INTRAVENOUS
  Filled 2024-03-07: qty 50

## 2024-03-07 NOTE — Subjective & Objective (Signed)
 Wound on left lower leg has been followed by wound care since April Had debridement On doxy and amoxicillin   Was initially tachy Was supposed to see ID in AM but ended up here because more red  No fever has chills he is diabetic

## 2024-03-07 NOTE — H&P (Signed)
 Joshua Conner UJW:[id number] DOB: May 23, 1971 DOA: 03/07/2024     PCP: Jimmey Mould, MD   Outpatient Specialists: * NONE CARDS: * Dr. None  NEphrology: *  Dr. No care team member to display  NEurology *   Dr. Pulmonary *  Dr.  Oncology * Dr.No care team member to display  GI* Dr.  Cherene Core, LB) No care team member to display Urology Dr. *  Patient arrived to ER on 03/07/24 at 2205 Referred by Attending Trish Furl, MD   Patient coming from:    home Lives alone,   *** With family      Chief Complaint:   Chief Complaint  Patient presents with   Wound Infection    HPI: Joshua Conner is a 53 y.o. male with medical history significant of  DM2 left,  leg wound, HLD, HTN, seizure do, GERD,     Presented with  left leg wound Wound on left lower leg has been followed by wound care since April Had debridement On doxy and amoxicillin   Was initially tachy Was supposed to see ID in AM but ended up here because more red  No fever has chills he is diabetic   Reports foul smelling drainage    Denies significant ETOH intake *** Does not smoke*** but interested in quitting***  Denies marijuana use ***    Regarding pertinent Chronic problems:    Hyperlipidemia -  on statins Crestor     ***HTN on       DM 2 -  Lab Results  Component Value Date   HGBA1C 6.3 (H) 02/15/2024    Jardiance      Morbid obesity-   BMI Readings from Last 1 Encounters:  02/14/24 41.00 kg/m     *** Asthma -well *** controlled on home inhalers/ nebs                     *** COPD - not **followed by pulmonology *** not  on baseline oxygen  *L,    *** OSA -on nocturnal oxygen, *CPAP, *noncompliant with CPAP    ***Hx of DVT/PE on - anticoagulation with ****Coumadin  ***Xarelto,* Eliquis,     ***CKD stage III*-   baseline Cr **** CrCl cannot be calculated (Unknown ideal weight.).  Lab Results  Component Value Date   CREATININE 1.30 (H) 03/07/2024   CREATININE 1.16  02/16/2024   CREATININE 1.20 02/15/2024   Lab Results  Component Value Date   NA 140 03/07/2024   CL 107 03/07/2024   K 3.6 03/07/2024   CO2 21 (L) 03/07/2024   BUN 16 03/07/2024   CREATININE 1.30 (H) 03/07/2024   GFRNONAA >60 03/07/2024   CALCIUM  9.0 03/07/2024   ALBUMIN 4.0 03/07/2024   GLUCOSE 218 (H) 03/07/2024    Seizure DO - las seizure *** currently on   On lamictal       While in ER:    Presents with worsening wound Plain images negative    Lab Orders         Culture, blood (Routine x 2)         Comprehensive metabolic panel         CBC with Differential         Protime-INR         I-Stat Lactic Acid, ED      Tib fib neg Following Medications were ordered in ER: Medications  vancomycin  (VANCOREADY) IVPB 2000 mg/400 mL (has no administration in time range)  piperacillin-tazobactam (ZOSYN) IVPB 3.375 g (has no administration in time range)  morphine  (PF) 4 MG/ML injection 4 mg (4 mg Intravenous Given 03/07/24 2311)  sodium chloride  0.9 % bolus 1,000 mL (1,000 mLs Intravenous New Bag/Given 03/07/24 2310)    _______________________________________________________ ER Provider Called:       DrAaron Aas  They Recommend admit to medicine *** Will see in AM  ***SEEN in ER     ED Triage Vitals  Encounter Vitals Group     BP 03/07/24 2210 (!) 154/87     Systolic BP Percentile --      Diastolic BP Percentile --      Pulse Rate 03/07/24 2210 (!) 136     Resp 03/07/24 2211 (!) 21     Temp 03/07/24 2211 98.2 F (36.8 C)     Temp Source 03/07/24 2211 Oral     SpO2 03/07/24 2210 97 %     Weight --      Height --      Head Circumference --      Peak Flow --      Pain Score 03/07/24 2211 10     Pain Loc --      Pain Education --      Exclude from Growth Chart --   ZOXW(96)@     _________________________________________ Significant initial  Findings: Abnormal Labs Reviewed  COMPREHENSIVE METABOLIC PANEL WITH GFR - Abnormal; Notable for the following components:       Result Value   CO2 21 (*)    Glucose, Bld 218 (*)    Creatinine, Ser 1.30 (*)    All other components within normal limits  CBC WITH DIFFERENTIAL/PLATELET - Abnormal; Notable for the following components:   WBC 14.1 (*)    Neutro Abs 11.6 (*)    All other components within normal limits      _________________________ Troponin ***ordered Cardiac Panel (last 3 results) No results for input(s): "CKTOTAL", "CKMB", "TROPONINIHS", "RELINDX" in the last 72 hours.   ECG: Ordered Personally reviewed and interpreted by me showing: HR : *** Rhythm: *NSR, Sinus tachycardia * A.fib. W RVR, RBBB, LBBB, Paced Ischemic changes*nonspecific changes, no evidence of ischemic changes QTC*  BNP (last 3 results) No results for input(s): "BNP" in the last 8760 hours.    No results for input(s): "DDIMER", "FERRITIN", "LDH", "CRP" in the last 72 hours.    ____________________ This patient meets SIRS Criteria and may be septic. SIRS = Systemic Inflammatory Response Syndrome  Order a lactic acid level if needed AND/OR Initiate the sepsis protocol with the attached order set OR Click "Treating Associated Infection or Illness" if the patient is being treated for an infection that is a known cause of these abnormalities     The recent clinical data is shown below. Vitals:   03/07/24 2215 03/07/24 2216 03/07/24 2217 03/07/24 2230  BP: (!) 126/110   111/85  Pulse: (!) 124 (!) 124 (!) 122   Resp:      Temp:      TempSrc:      SpO2: 96% 95% 96%         WBC     Component Value Date/Time   WBC 14.1 (H) 03/07/2024 2224   LYMPHSABS 1.6 03/07/2024 2224   MONOABS 0.7 03/07/2024 2224   EOSABS 0.1 03/07/2024 2224   BASOSABS 0.1 03/07/2024 2224        Lactic Acid, Venous    Component Value Date/Time   LATICACIDVEN 1.4 03/07/2024 2233  Lactic Acid, Venous    Component Value Date/Time   LATICACIDVEN 1.4 03/07/2024 2233    Procalcitonin *** Ordered      UA *** no evidence of  UTI  ***Pending ***not ordered   Urine analysis:    Component Value Date/Time   COLORURINE YELLOW 01/29/2015 1609   APPEARANCEUR CLEAR 01/29/2015 1609   LABSPEC 1.021 01/29/2015 1609   PHURINE 6.0 01/29/2015 1609   GLUCOSEU NEGATIVE 01/29/2015 1609   HGBUR NEGATIVE 01/29/2015 1609   BILIRUBINUR NEGATIVE 01/29/2015 1609   KETONESUR NEGATIVE 01/29/2015 1609   PROTEINUR NEGATIVE 01/29/2015 1609   UROBILINOGEN 0.2 01/29/2015 1609   NITRITE NEGATIVE 01/29/2015 1609   LEUKOCYTESUR NEGATIVE 01/29/2015 1609    Results for orders placed or performed during the hospital encounter of 02/14/24  Culture, blood (routine x 2)     Status: None   Collection Time: 02/14/24  1:40 AM   Specimen: BLOOD  Result Value Ref Range Status   Specimen Description   Final    BLOOD RIGHT ANTECUBITAL Performed at [facility], [address] [place], [place] [address]    Special Requests   Final    BOTTLES DRAWN AEROBIC AND ANAEROBIC Blood Culture adequate volume Performed at [facility], [address] [place], [place] [address]    Culture   Final    NO GROWTH 5 DAYS Performed at [facility], [address], [place], [place] [address]    Report Status 02/19/2024 FINAL  Final  Culture, blood (routine x 2)     Status: None   Collection Time: 02/14/24  1:41 AM   Specimen: BLOOD  Result Value Ref Range Status   Specimen Description   Final    BLOOD LEFT ANTECUBITAL Performed at [facility], [address] [place], [place] [address]    Special Requests   Final    BOTTLES DRAWN AEROBIC AND ANAEROBIC Blood Culture adequate volume Performed at [facility], [address] [place], [place] [address]    Culture   Final    NO GROWTH 5 DAYS Performed at [facility], [address], [place], [place] [address]    Report Status 02/19/2024 FINAL  Final    ABX started Antibiotics Given (last 72 hours)     None        No results found for  the last 90 days.    ________________________________________________________________  Arterial ***Venous  Blood Gas result:  pH *** pCO2 ***; pO2 ***;     %O2 Sat ***.  ABG No results found for: "PHART", "PCO2ART", "PO2ART", "HCO3", "TCO2", "ACIDBASEDEF", "O2SAT"     __________________________________________________________ Recent Labs  Lab 03/07/24 2224  NA 140  K 3.6  CO2 21*  GLUCOSE 218*  BUN 16  CREATININE 1.30*  CALCIUM  9.0    Cr  * stable,  Up from baseline see below Lab Results  Component Value Date   CREATININE 1.30 (H) 03/07/2024   CREATININE 1.16 02/16/2024   CREATININE 1.20 02/15/2024    Recent Labs  Lab 03/07/24 2224  AST 15  ALT 30  ALKPHOS 67  BILITOT 0.9  PROT 7.1  ALBUMIN 4.0   Lab Results  Component Value Date   CALCIUM  9.0 03/07/2024          Plt: Lab Results  Component Value Date   PLT 271 03/07/2024         Recent Labs  Lab 03/07/24 2224  WBC 14.1*  NEUTROABS 11.6*  HGB 14.1  HCT 43.3  MCV 91.4  PLT 271    HG/HCT * stable,  Down *Up from baseline see below    Component Value Date/Time   HGB 14.1 03/07/2024 2224   HCT 43.3 03/07/2024 2224   MCV 91.4 03/07/2024 2224      No results for input(s): "LIPASE", "AMYLASE" in the last 168 hours. No results for input(s): "AMMONIA" in the last 168 hours.    _______________________________________________ Hospitalist was called for admission for *** Cellulitis of left lower extremity ***    The following Work up has been ordered so far:  Orders Placed This Encounter  Procedures   Culture, blood (Routine x 2)   DG Tibia/Fibula Left   Comprehensive metabolic panel   CBC with Differential   Protime-INR   Diet NPO time specified   Notify physician (specify)  Specify: Notify provider for possible Code Sepsis   Document height and weight   Consult to hospitalist   I-Stat Lactic Acid, ED     OTHER Significant initial  Findings:  labs showing:     DM  labs:   HbA1C: Recent Labs    02/15/24 0614  HGBA1C 6.3*       CBG (last 3)  No results for input(s): "GLUCAP" in the last 72 hours.        Cultures:    Component Value Date/Time   SDES  02/14/2024 0141    BLOOD LEFT ANTECUBITAL Performed at [facility], [address] [place], [place] [address]    [facility]  02/14/2024 0141    BOTTLES DRAWN AEROBIC AND ANAEROBIC Blood Culture adequate volume Performed at [facility], [address] [place], [place] [address]    CULT  02/14/2024 0141    NO GROWTH 5 DAYS Performed at [facility], [address], [place], [place] [address]    REPTSTATUS 02/19/2024 FINAL 02/14/2024 0141     Radiological Exams on Admission: DG Tibia/Fibula Left Result Date: 03/07/2024 CLINICAL DATA:  Left lower extremity infection EXAM: LEFT TIBIA AND FIBULA - 2 VIEW COMPARISON:  02/11/2024 FINDINGS: There is no evidence of fracture or other focal bone lesions. Soft tissues are unremarkable. IMPRESSION: Negative. Electronically Signed   By: Juanetta Nordmann M.D.   On: 03/07/2024 23:23   _______________________________________________________________________________________________________ Latest  Blood pressure 111/85, pulse (!) 122, temperature 98.2 F (36.8 C), temperature source Oral, resp. rate (!) 21, SpO2 96%.   Vitals  labs and radiology finding personally reviewed  Review of Systems:    Pertinent positives include: ***  Constitutional:  No weight loss, night sweats, Fevers, chills, fatigue, weight loss  HEENT:  No headaches, Difficulty swallowing,Tooth/dental problems,Sore throat,  No sneezing, itching, ear ache, nasal congestion, post nasal drip,  Cardio-vascular:  No chest pain, Orthopnea, PND, anasarca, dizziness, palpitations.no Bilateral lower extremity swelling  GI:  No heartburn, indigestion, abdominal pain, nausea, vomiting, diarrhea, change in bowel habits, loss of appetite, melena, blood in stool,  hematemesis Resp:  no shortness of breath at rest. No dyspnea on exertion, No excess mucus, no productive cough, No non-productive cough, No coughing up of blood.No change in color of mucus.No wheezing. Skin:  no rash or lesions. No jaundice GU:  no dysuria, change in color of urine, no urgency or frequency. No straining to urinate.  No flank pain.  Musculoskeletal:  No joint pain or no joint swelling. No decreased range of motion. No back pain.  Psych:  No change in mood or affect. No depression or anxiety. No memory loss.  Neuro: no localizing neurological complaints, no tingling, no weakness, no double vision, no gait abnormality, no slurred speech, no confusion  All systems reviewed and apart from HOPI all are negative _______________________________________________________________________________________________ Past Medical History:   Past Medical History:  Diagnosis Date   Acid reflux    Seizures (HCC)    Varicose veins       Past Surgical History:  Procedure Laterality Date   HERNIA REPAIR     INGUINAL HERNIA REPAIR Left 02/04/2015   Procedure: LAPAROSCOPIC LEFT  INGUINAL HERNIA REPAIR WITH MESH;  Surgeon: Shela Derby, MD;  Location: [facility];  Service: General;  Laterality: Left;   INSERTION OF MESH Left 02/04/2015   Procedure: INSERTION OF MESH;  Surgeon: Shela Derby, MD;  Location: [facility];  Service: General;  Laterality: Left;   KNEE SURGERY     VARICOSE VEIN SURGERY      Social History:  Ambulatory *** independently cane, walker  wheelchair bound, bed bound     reports that he has never smoked. He does not have any smokeless tobacco history on file. He reports current alcohol use of about 12.0 standard drinks of alcohol per week. He reports that he does not use drugs.     Family History: *** Family History  Problem Relation Age of Onset   Hypertension Mother    COPD Mother    Cancer Mother 12       breast   Hypertension Father    Hypertension  Sister    ______________________________________________________________________________________________ Allergies: No Known Allergies   Prior to Admission medications   Medication Sig Start Date End Date Taking? Authorizing Provider  JARDIANCE 25 MG TABS tablet Take 25 mg by mouth in the morning. 01/26/24   [provider]  LamoTRIgine  250 MG TB24 24 hour tablet Take 250 mg by mouth in the morning. 01/26/24   [provider]  pantoprazole  (PROTONIX ) 40 MG tablet Take 40 mg by mouth daily before breakfast.    [provider]  rosuvastatin  (CRESTOR ) 10 MG tablet Take 10 mg by mouth in the morning. 01/26/24   [provider]  Soft Lens Products (CLEAR EYES CONTACT LENS RELIEF) SOLN Place 1 drop into both eyes as needed (for dry eyes). For rewetting contact lenses.    [provider]  TYLENOL  500 MG tablet Take 1,000 mg by mouth every 6 (six) hours as needed (for pain).    [provider]    ___________________________________________________________________________________________________ Physical Exam:    03/07/2024   10:30 PM 03/07/2024   10:17 PM 03/07/2024   10:16 PM  Vitals with BMI  Systolic 111    Diastolic 85    Pulse  122 124     1. General:  in No ***Acute distress***increased work of breathing ***complaining of severe pain****agitated * Chronically ill *well *cachectic *toxic acutely ill -appearing 2. Psychological: Alert and *** Oriented 3. Head/ENT:   Moist *** Dry Mucous Membranes                          Head Non traumatic, neck supple                          Normal *** Poor Dentition 4. SKIN: normal *** decreased Skin turgor,  Skin clean Dry and intact no rash    5. Heart: Regular rate and rhythm no***  Murmur, no Rub or gallop 6. Lungs: ***Clear to auscultation bilaterally, no wheezes or crackles   7. Abdomen: Soft, ***non-tender, Non distended *** obese ***bowel sounds present 8. Lower extremities: no clubbing,  cyanosis, no ***edema 9. Neurologically Grossly intact, moving all 4 extremities equally *** strength 5 out of 5 in all 4 extremities cranial nerves II through XII intact 10. MSK: Normal range of motion    Chart has been reviewed  ______________________________________________________________________________________________  Assessment/Plan  ***  Admitted for *** Cellulitis of left lower extremity ***    Present on Admission: **None**     No problem-specific Assessment & Plan notes found for this encounter.    Other plan as per orders.  DVT prophylaxis:  SCD *** Lovenox       Code Status:    Code Status: Prior FULL CODE *** DNR/DNI ***comfort care as per patient ***family  I had personally discussed CODE STATUS with patient and family*  ACP *** none has been reviewed ***   Family Communication:   Family not at  Bedside  plan of care was discussed on the phone with *** Son, Daughter, Wife, Husband, Sister, Brother , father, mother  Diet  Diet Orders (From admission, onward)     Start     Ordered   03/07/24 2214  Diet NPO time specified  Diet effective now        03/07/24 2214            Disposition Plan:   *** likely will need placement for rehabilitation                          Back to current facility when stable                            To home once workup is complete and patient is stable  ***Following barriers for discharge:                             Chest pain *** Stroke *** Syncope ***work up is complete                            Electrolytes corrected                               Anemia corrected h/H stable                             Pain controlled with PO medications                               Afebrile, white count improving able to transition to PO antibiotics                             Will need to be able to tolerate PO                            Will likely need home health, home O2, set up  Will need  consultants to evaluate patient prior to discharge                           Work of breathing improves       Consult Orders  (From admission, onward)           Start     Ordered   03/07/24 2327  Consult to hospitalist  Once       Provider:  (Not yet assigned)  Question Answer Comment  Place call to: [facility]   Reason for Consult Admit      03/07/24 2328                              ***Would benefit from PT/OT eval prior to DC  Ordered                   Swallow eval - SLP ordered                   Diabetes care coordinator                   Transition of care consulted                   Nutrition    consulted                  Wound care  consulted                   Palliative care    consulted                   Behavioral health  consulted                    Consults called: ***     Admission status:  ED Disposition     ED Disposition  Admit   Condition  --   Comment  The patient appears reasonably stabilized for admission considering the current resources, flow, and capabilities available in the ED at this time, and I doubt any other [facility] requiring further screening and/or treatment in the ED prior to admission is  present.           Obs***  ***  inpatient     I Expect 2 midnight stay secondary to severity of patient's current illness need for inpatient interventions justified by the following: ***hemodynamic instability despite optimal treatment (tachycardia *hypotension * tachypnea *hypoxia, hypercapnia) *** Severe lab/radiological/exam abnormalities including:    Cellulitis of left lower extremity ***  and extensive comorbidities including: *substance abuse  *Chronic pain *DM2  * CHF * CAD  * COPD/asthma *Morbid Obesity * CKD *dementia *liver disease *history of stroke with residual deficits *  malignancy, * sickle cell disease  History of amputation Chronic anticoagulation  That are currently affecting medical  management.   I expect  patient to be hospitalized for 2 midnights requiring inpatient medical care.  Patient is at high risk for adverse outcome (such as loss of life or disability) if not treated.  Indication for inpatient stay as follows:  Severe change from baseline regarding mental status Hemodynamic instability despite maximal medical therapy,  severe pain requiring acute inpatient management,  inability to maintain oral hydration   persistent chest pain despite medical management Need for operative/procedural  intervention New or worsening hypoxia ongoing  suicidal ideations   Need for IV antibiotics, IV fluids, IV rate controling medications, IV antihypertensives, IV pain medications, IV anticoagulation, need for biPAP    Level of care   *** tele  For 12H 24H     medical floor       progressive     stepdown   tele indefinitely please discontinue once patient no longer qualifies COVID-19 Labs    Critical***  Patient is critically ill due to  hemodynamic instability * respiratory failure *severe sepsis* ongoing chest pain*  They are at high risk for life/limb threatening clinical deterioration requiring frequent reassessment and modifications of care.  Services provided include examination of the patient, review of relevant ancillary tests, prescription of lifesaving therapies, review of medications and prophylactic therapy.  Total critical care time excluding separately billable procedures: 60*  Minutes.    Tzvi Economou 03/07/2024, 11:34 PM ***  [facility]     after 2 AM please page floor coverage   If 7AM-7PM, please contact the day team taking care of the patient using [email or web address]

## 2024-03-07 NOTE — ED Provider Notes (Signed)
 [place] EMERGENCY DEPARTMENT AT [facility] Provider Note   CSN: [id number] Arrival date & time: 03/07/24  2205     History  Chief Complaint  Patient presents with   Wound Infection    Joshua Conner is a 53 y.o. male.  The history is provided by the patient and medical records.   53 y.o. M with hx of seizures, varicose veins, diabetes, hyperlipidemia, presenting to the ED for wound to left lower extremity.  This has been present since 02/03/24.  Has been under care of wound center, actually saw them yesterday and had a debridement.  States today he noticed increased erythema, drainage, and some chills.  He has been on doxycycline  and amoxicillin  for the past 2 weeks as well.  Was scheduled to see ID in the morning for follow-up but given worsening wound decided to come in tonight.    Home Medications Prior to Admission medications   Medication Sig Start Date End Date Taking? Authorizing Provider  JARDIANCE 25 MG TABS tablet Take 25 mg by mouth in the morning. 01/26/24   [provider]  LamoTRIgine  250 MG TB24 24 hour tablet Take 250 mg by mouth in the morning. 01/26/24   [provider]  pantoprazole  (PROTONIX ) 40 MG tablet Take 40 mg by mouth daily before breakfast.    [provider]  rosuvastatin  (CRESTOR ) 10 MG tablet Take 10 mg by mouth in the morning. 01/26/24   [provider]  Soft Lens Products (CLEAR EYES CONTACT LENS RELIEF) SOLN Place 1 drop into both eyes as needed (for dry eyes). For rewetting contact lenses.    [provider]  TYLENOL  500 MG tablet Take 1,000 mg by mouth every 6 (six) hours as needed (for pain).    [provider]      Allergies    Patient has no known allergies.    Review of Systems   Review of Systems  Skin:  Positive for wound.  All other systems reviewed and are negative.   Physical Exam Updated Vital Signs BP 111/85   Pulse (!) 122   Temp 98.2 F (36.8 C) (Oral)    Resp (!) 21   SpO2 96%   Physical Exam Vitals and nursing note reviewed.  Constitutional:      Appearance: He is well-developed.  HENT:     Head: Normocephalic and atraumatic.  Eyes:     Conjunctiva/sclera: Conjunctivae normal.     Pupils: Pupils are equal, round, and reactive to light.  Cardiovascular:     Rate and Rhythm: Normal rate and regular rhythm.     Heart sounds: Normal heart sounds.  Pulmonary:     Effort: Pulmonary effort is normal.     Breath sounds: Normal breath sounds.  Abdominal:     General: Bowel sounds are normal.     Palpations: Abdomen is soft.  Musculoskeletal:        General: Normal range of motion.     Cervical back: Normal range of motion.     Comments: Wound to LLE, cellulitis and warmth to touch surrounding, there is some purulent drainage present, locally tender, no tissue crepitus present, DP pulse intact, normal sensation distally  Skin:    General: Skin is warm and dry.  Neurological:     Mental Status: He is alert and oriented to person, place, and time.      ED Results / Procedures / Treatments   Labs (all labs ordered are listed, but only abnormal  results are displayed) Labs Reviewed  COMPREHENSIVE METABOLIC PANEL WITH GFR - Abnormal; Notable for the following components:      Result Value   CO2 21 (*)    Glucose, Bld 218 (*)    Creatinine, Ser 1.30 (*)    All other components within normal limits  CBC WITH DIFFERENTIAL/PLATELET - Abnormal; Notable for the following components:   WBC 14.1 (*)    Neutro Abs 11.6 (*)    All other components within normal limits  CULTURE, BLOOD (ROUTINE X 2)  CULTURE, BLOOD (ROUTINE X 2)  PROTIME-INR  I-STAT CG4 LACTIC ACID, ED    EKG None  Radiology DG Tibia/Fibula Left Result Date: 03/07/2024 CLINICAL DATA:  Left lower extremity infection EXAM: LEFT TIBIA AND FIBULA - 2 VIEW COMPARISON:  02/11/2024 FINDINGS: There is no evidence of fracture or other focal bone lesions. Soft tissues are  unremarkable. IMPRESSION: Negative. Electronically Signed   By: Juanetta Nordmann M.D.   On: 03/07/2024 23:23    Procedures Procedures    CRITICAL CARE Performed by: Coretha Dew   Total critical care time: 45 minutes  Critical care time was exclusive of separately billable procedures and treating other patients.  Critical care was necessary to treat or prevent imminent or life-threatening deterioration.  Critical care was time spent personally by me on the following activities: development of treatment plan with patient and/or surrogate as well as nursing, discussions with consultants, evaluation of patient's response to treatment, examination of patient, obtaining history from patient or surrogate, ordering and performing treatments and interventions, ordering and review of laboratory studies, ordering and review of radiographic studies, pulse oximetry and re-evaluation of patient's condition.   Medications Ordered in ED Medications  vancomycin  (VANCOREADY) IVPB 2000 mg/400 mL (has no administration in time range)  piperacillin-tazobactam (ZOSYN) IVPB 3.375 g (has no administration in time range)  insulin  aspart (novoLOG ) injection 0-9 Units (has no administration in time range)  morphine  (PF) 4 MG/ML injection 4 mg (4 mg Intravenous Given 03/07/24 2311)  sodium chloride  0.9 % bolus 1,000 mL (1,000 mLs Intravenous New Bag/Given 03/07/24 2310)    ED Course/ Medical Decision Making/ A&P                                 Medical Decision Making Amount and/or Complexity of Data Reviewed Labs: ordered. Radiology: ordered and independent interpretation performed. ECG/medicine tests: ordered and independent interpretation performed.  Risk Prescription drug management. Decision regarding hospitalization.   53 year old male presenting to the ED with worsening wound to left lower extremity.  Sustained a wound from a slab of marble 02/03/24.  Has been seeing wound care since then.   Currently on amoxicillin  and doxycycline .  Had debridement yesterday and started to have redness, chills, and increased drainage today.  He is afebrile, non-toxic in appearance here.  He is tachycardic but otherwise HD stable.  Wound does appear to have development of some purulent drainage and surrounding cellulitis.  No tissue crepitus.  Labs with leukocytosis of 14,000 but normal lactate.  No significant electrolyte derangement.  Blood cultures pending.  X-ray without soft tissue gas or findings of osteomyelitis.    Patient has obvious infection post debridement yesterday.  He is already on doxycycline /amoxicillin  so seems to have a failure.  Will start broad spectrum vanc/zosyn pending blood cultures.  Was supposed to be ID in the morning in clinic, may need to round on him here.  Discussed with Dr. Hendrick Locke-- will admit for ongoing care.  Final Clinical Impression(s) / ED Diagnoses Final diagnoses:  Cellulitis of left lower extremity    Rx / DC Orders ED Discharge Orders     None         Coretha Dew, PA-C 03/07/24 2351    Trish Furl, MD 03/09/24 [phone]

## 2024-03-07 NOTE — ED Triage Notes (Signed)
 Pt as a wound to LLE that has been followed by wound clinic. Sts he is concerned for an infection as wound has had fouls smelling drainage, chills, suspected fevers, and NV. Triage HR 120s.

## 2024-03-07 NOTE — Progress Notes (Signed)
 ED Pharmacy Antibiotic Sign Off An antibiotic consult was received from an ED provider for Vancomycin  & Zosyn per pharmacy dosing for wound infection. A chart review was completed to assess appropriateness.   The following one time order(s) were placed:  Vancomycin  2gm IV  Zosyn 3.375gm IV  Further antibiotic and/or antibiotic pharmacy consults should be ordered by the admitting provider if indicated.   Thank you for allowing pharmacy to be a part of this patient's care.   Arie Kurtz, [facility]  Clinical Pharmacist 03/07/24 11:11 PM

## 2024-03-08 ENCOUNTER — Encounter (HOSPITAL_COMMUNITY): Payer: Self-pay | Admitting: Internal Medicine

## 2024-03-08 ENCOUNTER — Ambulatory Visit: Admitting: Infectious Diseases

## 2024-03-08 DIAGNOSIS — G4733 Obstructive sleep apnea (adult) (pediatric): Secondary | ICD-10-CM | POA: Diagnosis present

## 2024-03-08 DIAGNOSIS — L539 Erythematous condition, unspecified: Secondary | ICD-10-CM | POA: Diagnosis not present

## 2024-03-08 DIAGNOSIS — Z8669 Personal history of other diseases of the nervous system and sense organs: Secondary | ICD-10-CM

## 2024-03-08 DIAGNOSIS — A419 Sepsis, unspecified organism: Secondary | ICD-10-CM | POA: Diagnosis present

## 2024-03-08 DIAGNOSIS — E66813 Obesity, class 3: Secondary | ICD-10-CM | POA: Diagnosis present

## 2024-03-08 DIAGNOSIS — N1831 Chronic kidney disease, stage 3a: Secondary | ICD-10-CM | POA: Diagnosis present

## 2024-03-08 LAB — COMPREHENSIVE METABOLIC PANEL WITH GFR
ALT: 21 U/L (ref 0–44)
AST: 11 U/L — ABNORMAL LOW (ref 15–41)
Albumin: 3.3 g/dL — ABNORMAL LOW (ref 3.5–5.0)
Alkaline Phosphatase: 50 U/L (ref 38–126)
Anion gap: 7 (ref 5–15)
BUN: 18 mg/dL (ref 6–20)
CO2: 24 mmol/L (ref 22–32)
Calcium: 8.3 mg/dL — ABNORMAL LOW (ref 8.9–10.3)
Chloride: 109 mmol/L (ref 98–111)
Creatinine, Ser: 1.12 mg/dL (ref 0.61–1.24)
GFR, Estimated: >60 mL/min (ref >60)
Glucose, Bld: 131 mg/dL — ABNORMAL HIGH (ref 70–99)
Potassium: 3.9 mmol/L (ref 3.5–5.1)
Sodium: 140 mmol/L (ref 135–145)
Total Bilirubin: 0.7 mg/dL (ref 0.0–1.2)
Total Protein: 5.8 g/dL — ABNORMAL LOW (ref 6.5–8.1)

## 2024-03-08 LAB — CBC
HCT: 36.8 % — ABNORMAL LOW (ref 39.0–52.0)
Hemoglobin: 11.7 g/dL — ABNORMAL LOW (ref 13.0–17.0)
MCH: 29.6 pg (ref 26.0–34.0)
MCHC: 31.8 g/dL (ref 30.0–36.0)
MCV: 93.2 fL (ref 80.0–100.0)
Platelets: 207 10*3/uL (ref 150–400)
RBC: 3.95 MIL/uL — ABNORMAL LOW (ref 4.22–5.81)
RDW: 14 % (ref 11.5–15.5)
WBC: 9.5 10*3/uL (ref 4.0–10.5)
nRBC: 0 % (ref 0.0–0.2)

## 2024-03-08 LAB — MRSA NEXT GEN BY PCR, NASAL: MRSA by PCR Next Gen: NOT DETECTED

## 2024-03-08 LAB — GLUCOSE, CAPILLARY
Glucose-Capillary: 144 mg/dL — ABNORMAL HIGH (ref 70–99)
Glucose-Capillary: 162 mg/dL — ABNORMAL HIGH (ref 70–99)
Glucose-Capillary: 107 mg/dL — ABNORMAL HIGH (ref 70–99)
Glucose-Capillary: 136 mg/dL — ABNORMAL HIGH (ref 70–99)
Glucose-Capillary: 90 mg/dL (ref 70–99)
Glucose-Capillary: 128 mg/dL — ABNORMAL HIGH (ref 70–99)
Glucose-Capillary: 119 mg/dL — ABNORMAL HIGH (ref 70–99)

## 2024-03-08 LAB — MAGNESIUM: Magnesium: 2 mg/dL (ref 1.7–2.4)

## 2024-03-08 LAB — HEMOGLOBIN A1C
Hgb A1c MFr Bld: 7 % — ABNORMAL HIGH (ref 4.8–5.6)
Mean Plasma Glucose: 154 mg/dL

## 2024-03-08 LAB — PHOSPHORUS: Phosphorus: 4 mg/dL (ref 2.5–4.6)

## 2024-03-08 MED ORDER — ONDANSETRON HCL 4 MG/2ML IJ SOLN: 4.0000 mg | Freq: Four times a day (QID) | INTRAMUSCULAR | Status: DC | PRN

## 2024-03-08 MED ORDER — SODIUM CHLORIDE 0.9 % IV SOLN
2.0000 g | Freq: Three times a day (TID) | INTRAVENOUS | Status: DC
  Administered 2024-03-08: 2 g via INTRAVENOUS
  Filled 2024-03-08: qty 12.5

## 2024-03-08 MED ORDER — ENOXAPARIN SODIUM 80 MG/0.8ML IJ SOSY
70.0000 mg | PREFILLED_SYRINGE | Freq: Every day | INTRAMUSCULAR | Status: DC
  Administered 2024-03-08 – 2024-03-16 (×9): 70 mg via SUBCUTANEOUS
  Filled 2024-03-08: qty 0.8
  Filled 2024-03-08 (×2): qty 0.7
  Filled 2024-03-08: qty 0.8
  Filled 2024-03-08 (×5): qty 0.7

## 2024-03-08 MED ORDER — ACETAMINOPHEN 325 MG PO TABS
650.0000 mg | ORAL_TABLET | Freq: Four times a day (QID) | ORAL | Status: DC | PRN
  Administered 2024-03-09: 650 mg via ORAL
  Filled 2024-03-08: qty 2

## 2024-03-08 MED ORDER — VANCOMYCIN HCL IN DEXTROSE 1-5 GM/200ML-% IV SOLN
1000.0000 mg | Freq: Two times a day (BID) | INTRAVENOUS | Status: DC
  Filled 2024-03-08: qty 200

## 2024-03-08 MED ORDER — LAMOTRIGINE ER 50 MG PO TB24
250.0000 mg | ORAL_TABLET | Freq: Every day | ORAL | Status: DC
  Administered 2024-03-08 – 2024-03-16 (×9): 250 mg via ORAL
  Filled 2024-03-08 (×12): qty 1

## 2024-03-08 MED ORDER — HYDROCODONE-ACETAMINOPHEN 5-325 MG PO TABS
1.0000 | ORAL_TABLET | ORAL | Status: DC | PRN
  Administered 2024-03-08 (×2): 1 via ORAL
  Administered 2024-03-08 – 2024-03-09 (×2): 2 via ORAL
  Filled 2024-03-08: qty 2
  Filled 2024-03-08: qty 1
  Filled 2024-03-08: qty 2
  Filled 2024-03-08: qty 1

## 2024-03-08 MED ORDER — SODIUM CHLORIDE 0.9 % IV BOLUS (SEPSIS)
400.0000 mL | Freq: Once | INTRAVENOUS | Status: AC
  Administered 2024-03-08: 400 mL via INTRAVENOUS

## 2024-03-08 MED ORDER — MORPHINE SULFATE (PF) 2 MG/ML IV SOLN
2.0000 mg | INTRAVENOUS | Status: DC | PRN
  Administered 2024-03-08 – 2024-03-10 (×6): 2 mg via INTRAVENOUS
  Filled 2024-03-08 (×7): qty 1

## 2024-03-08 MED ORDER — EMPAGLIFLOZIN 25 MG PO TABS
25.0000 mg | ORAL_TABLET | Freq: Every day | ORAL | Status: DC
  Administered 2024-03-08 – 2024-03-16 (×9): 25 mg via ORAL
  Filled 2024-03-08 (×10): qty 1

## 2024-03-08 MED ORDER — SODIUM CHLORIDE 0.9 % IV SOLN
INTRAVENOUS | Status: DC
Start: 2024-03-08 — End: 2024-03-08

## 2024-03-08 MED ORDER — LEVOFLOXACIN 750 MG PO TABS
750.0000 mg | ORAL_TABLET | Freq: Every day | ORAL | Status: DC
  Administered 2024-03-08 – 2024-03-10 (×3): 750 mg via ORAL
  Filled 2024-03-08 (×3): qty 1

## 2024-03-08 MED ORDER — LAMOTRIGINE ER 250 MG PO TB24: 250.0000 mg | ORAL_TABLET | Freq: Every morning | ORAL | Status: DC

## 2024-03-08 MED ORDER — ROSUVASTATIN CALCIUM 5 MG PO TABS
10.0000 mg | ORAL_TABLET | Freq: Every day | ORAL | Status: DC
  Administered 2024-03-08 – 2024-03-16 (×9): 10 mg via ORAL
  Filled 2024-03-08: qty 2
  Filled 2024-03-08: qty 1
  Filled 2024-03-08 (×2): qty 2
  Filled 2024-03-08: qty 1
  Filled 2024-03-08 (×4): qty 2

## 2024-03-08 MED ORDER — FENTANYL CITRATE PF 50 MCG/ML IJ SOSY
25.0000 ug | PREFILLED_SYRINGE | INTRAMUSCULAR | Status: DC | PRN
  Administered 2024-03-08 (×3): 25 ug via INTRAVENOUS
  Filled 2024-03-08 (×4): qty 1

## 2024-03-08 MED ORDER — ACETAMINOPHEN 650 MG RE SUPP: 650.0000 mg | Freq: Four times a day (QID) | RECTAL | Status: DC | PRN

## 2024-03-08 MED ORDER — SODIUM CHLORIDE 0.9 % IV BOLUS (SEPSIS): 1000.0000 mL | Freq: Once | INTRAVENOUS | Status: DC

## 2024-03-08 MED ORDER — ONDANSETRON HCL 4 MG PO TABS
4.0000 mg | ORAL_TABLET | Freq: Four times a day (QID) | ORAL | Status: DC | PRN
Start: 2024-03-08 — End: 2024-03-16

## 2024-03-08 MED ORDER — PANTOPRAZOLE SODIUM 40 MG PO TBEC
40.0000 mg | DELAYED_RELEASE_TABLET | Freq: Every day | ORAL | Status: DC
  Administered 2024-03-08 – 2024-03-16 (×8): 40 mg via ORAL
  Filled 2024-03-08 (×8): qty 1

## 2024-03-08 MED ORDER — ORAL CARE MOUTH RINSE: 15.0000 mL | OROMUCOSAL | Status: DC | PRN

## 2024-03-08 MED ORDER — SODIUM CHLORIDE 0.9 % IV BOLUS (SEPSIS)
1000.0000 mL | Freq: Once | INTRAVENOUS | Status: AC
  Administered 2024-03-08: 1000 mL via INTRAVENOUS

## 2024-03-08 MED ORDER — LINEZOLID 600 MG PO TABS
600.0000 mg | ORAL_TABLET | Freq: Two times a day (BID) | ORAL | Status: DC
  Administered 2024-03-08 – 2024-03-10 (×5): 600 mg via ORAL
  Filled 2024-03-08 (×7): qty 1

## 2024-03-08 NOTE — Plan of Care (Signed)
  Problem: Education: Goal: Ability to describe self-care measures that may prevent or decrease complications (Diabetes Survival Skills Education) will improve Outcome: Progressing Goal: Individualized Educational Video(s) Outcome: Progressing   Problem: Coping: Goal: Ability to adjust to condition or change in health will improve Outcome: Progressing   Problem: Fluid Volume: Goal: Ability to maintain a balanced intake and output will improve Outcome: Progressing   Problem: Health Behavior/Discharge Planning: Goal: Ability to identify and utilize available resources and services will improve Outcome: Progressing Goal: Ability to manage health-related needs will improve Outcome: Progressing   Problem: Metabolic: Goal: Ability to maintain appropriate glucose levels will improve Outcome: Progressing   Problem: Nutritional: Goal: Maintenance of adequate nutrition will improve Outcome: Progressing Goal: Progress toward achieving an optimal weight will improve Outcome: Progressing   Problem: Skin Integrity: Goal: Risk for impaired skin integrity will decrease Outcome: Progressing   Problem: Tissue Perfusion: Goal: Adequacy of tissue perfusion will improve Outcome: Progressing   Problem: Education: Goal: Knowledge of General Education information will improve Description: Including pain rating scale, medication(s)/side effects and non-pharmacologic comfort measures Outcome: Progressing   Problem: Health Behavior/Discharge Planning: Goal: Ability to manage health-related needs will improve Outcome: Progressing   Problem: Clinical Measurements: Goal: Ability to maintain clinical measurements within normal limits will improve Outcome: Progressing Goal: Will remain free from infection Outcome: Progressing Goal: Diagnostic test results will improve Outcome: Progressing Goal: Respiratory complications will improve Outcome: Progressing Goal: Cardiovascular complication will  be avoided Outcome: Progressing   Problem: Activity: Goal: Risk for activity intolerance will decrease Outcome: Progressing   Problem: Nutrition: Goal: Adequate nutrition will be maintained Outcome: Progressing   Problem: Coping: Goal: Level of anxiety will decrease Outcome: Progressing   Problem: Elimination: Goal: Will not experience complications related to bowel motility Outcome: Progressing Goal: Will not experience complications related to urinary retention Outcome: Progressing   Problem: Pain Managment: Goal: General experience of comfort will improve and/or be controlled Outcome: Progressing   Problem: Safety: Goal: Ability to remain free from injury will improve Outcome: Progressing   Problem: Skin Integrity: Goal: Risk for impaired skin integrity will decrease Outcome: Progressing   Problem: Clinical Measurements: Goal: Ability to avoid or minimize complications of infection will improve Outcome: Progressing   Problem: Skin Integrity: Goal: Skin integrity will improve Outcome: Progressing   Problem: Fluid Volume: Goal: Hemodynamic stability will improve Outcome: Progressing   Problem: Clinical Measurements: Goal: Diagnostic test results will improve Outcome: Progressing Goal: Signs and symptoms of infection will decrease Outcome: Progressing   Problem: Respiratory: Goal: Ability to maintain adequate ventilation will improve Outcome: Progressing

## 2024-03-08 NOTE — Progress Notes (Signed)
   03/08/24 2257  BiPAP/CPAP/SIPAP  BiPAP/CPAP/SIPAP Pt Type Adult  BiPAP/CPAP/SIPAP Resmed  Mask Type Full face mask  Dentures removed? Not applicable  Mask Size Medium  EPAP 7 cmH2O (per patient stated home settings)  FiO2 (%) 21 %  Patient Home Machine No  Patient Home Mask No  Patient Home Tubing No  Auto Titrate No  CPAP/SIPAP surface wiped down Yes  Device Plugged into RED Power Outlet Yes  BiPAP/CPAP /SiPAP Vitals  Resp 14  MEWS Score/Color  MEWS Score 0  MEWS Score Color Green

## 2024-03-08 NOTE — Progress Notes (Signed)
   03/08/24 1407  TOC Brief Assessment  Insurance and Status Reviewed  Patient has primary care physician Yes  Home environment has been reviewed Single family home  Prior level of function: Independent  Prior/Current Home Services No current home services  Social Drivers of Health Review SDOH reviewed no interventions necessary  Readmission risk has been reviewed Yes  Transition of care needs no transition of care needs at this time

## 2024-03-08 NOTE — Progress Notes (Signed)
 Pharmacy Antibiotic Note  Joshua Conner is a 53 y.o. male admitted on 03/07/2024 with sepsis and cellulitis.  Pharmacy has been consulted for Cefepime + Vancomycin  dosing.  Plan: Cefepime 2gm IV q8h Vancomycin  1gm IV q12h to target AUC 400-550.   Estimated AUC on this regimen = 450 Monitor renal function and cx data  Check levels as needed  Monitor renal function and cx data   Height: 6' (182.9 cm) Weight: (!) 137.1 kg (302 lb 4 oz) IBW/kg (Calculated) : 77.6  Temp (24hrs), Avg:98.1 F (36.7 C), Min:98 F (36.7 C), Max:98.2 F (36.8 C)  Recent Labs  Lab 03/07/24 2224 03/07/24 2233  WBC 14.1*  --   CREATININE 1.30*  --   LATICACIDVEN  --  1.4    Estimated Creatinine Clearance: 95.3 mL/min (A) (by C-G formula based on SCr of 1.3 mg/dL (H)).    No Known Allergies  Antimicrobials this admission: 5/13 Zosyn x1 5/13 Vancomycin  >>  5/14 Cefepime >>   Dose adjustments this admission:  Microbiology results: 5/13 BCx:   Thank you for allowing pharmacy to be a part of this patient's care.  Arie Kurtz PharmD 03/08/2024 1:23 AM

## 2024-03-08 NOTE — Assessment & Plan Note (Signed)
-  chronic avoid nephrotoxic medications such as NSAIDs, Vanco Zosyn combo,  avoid hypotension, continue to follow renal function

## 2024-03-08 NOTE — Assessment & Plan Note (Signed)
 History of partial seizures continue Lamictal  250 mg a day

## 2024-03-08 NOTE — Assessment & Plan Note (Signed)
 Continue Crestor  at 10 mg daily

## 2024-03-08 NOTE — Progress Notes (Signed)
 PROGRESS NOTE  Joshua Conner  DOB: August 29, 1971  PCP: Jimmey Mould, MD BJY:[id number]  DOA: 03/07/2024  LOS: 1 day  Hospital Day: 2  Brief narrative: Joshua Conner is a 53 y.o. male with PMH significant for DM2, HTN, HLD, morbid obesity, OSA on CPAP, seizure disorder, GERD, meningioma, varicose veins s/p ablation. On 02/03/24, while at work, piece of quartz fell on his left leg and sustained traumatic laceration.  The wound got infected eventually.  Since then, patient has had multiple courses of antibiotics, evaluation of the wound care center and also hospitalization for IV antibiotics in April. 5/12, 1 day prior to presentation, seen at wound care center. Underwent debridement. 5/13, patient noticed increasing redness around the wound and purulent drainage from the wound itself.  Later in the day, he had a scheduled appointment with ID but prior to their appointment, patient presented to the ED with foul-smelling drainage, subjective fever, nausea, vomiting and a concern of recurrent infection of chronic wound.   In the ED, patient was afebrile but tachycardic to 130s, blood pressure maintained Labs with WBC of 14.1, glucose 218, creatinine 1.3, lactic acid normal at 1.4 Left tibia/fibula x-ray negative for any osteomyelitis Blood culture was sent Patient was started on broad-spectrum IV antibiotic coverage with IV cefepime, IV Zosyn, IV vancomycin  Admitted to [facility] ID and wound care consulted  Subjective: Patient was seen and examined this morning.  Middle-aged Caucasian male.  Lying on bed.  Continues to have pain in the left lower extremity.  Dressing in place. ID consult pending. Chart reviewed. 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  Tachycardic to 130s -met sepsis criteria Left tibia/fibula x-ray negative for any osteomyelitis Blood culture sent. Continue broad-spectrum antibiotics ID consulted Seen by wound care this morning.   As recommended, ''Cleanse L lower leg wound with Vashe wound cleanser Timm Foot [phone]) do not rinse and allow to air dry.  Apply Vashe moistened gauze to wound bed 2 times daily, using a Q tip applicator to make sure and cover entire wound bed.  Cover with dry gauze and ABD pad.  Secure with Kerlix roll gauze.  Patient has a Tubigrip he uses at home, if he has this with him may use to help secure dressing.'' Recent Labs  Lab 03/07/24 2224 03/07/24 2233 03/08/24 0554  WBC 14.1*  --  9.5  LATICACIDVEN  --  1.4  --    Type 2 diabetes mellitus A1c 6.3 from April 2025 PTA meds-Jardiance 25 mg daily Currently on SSI/Accu-Cheks.  Resume Jardiance Recent Labs  Lab 03/08/24 0113 03/08/24 0352 03/08/24 0755 03/08/24 1221  GLUCAP 144* 162* 107* 136*   HLD Crestor   H/o seizure disorder Continue Lamictal   Morbid Obesity  Body mass index is 40.99 kg/m. Patient has been advised to make an attempt to improve diet and exercise patterns to aid in weight loss.  OSA On CPAP  GERD PPI   H/o varicose vein s/p ablation  several years ago on both legs     Mobility: Encourage ambulation  Goals of care   Code Status: Full Code     DVT prophylaxis:  SCDs Start: 03/08/24 0114   Antimicrobials: IV cefepime and IV vancomycin  Fluid: Noted on IV hydration Consultants: ID Family Communication: None at bedside  Status: Inpatient Level of care:  Telemetry   Patient is from: Home Needs to continue in-hospital  care: Needs IV antibiotics and may need further surgical care Anticipated d/c to: Pending clinical course      Diet:  Diet Order             Diet Carb Modified Fluid consistency: Thin; Room service appropriate? Yes  Diet effective now                   Scheduled Meds:  empagliflozin  25 mg Oral Daily   enoxaparin (LOVENOX) injection  70 mg Subcutaneous Daily   insulin  aspart  0-9 Units Subcutaneous Q4H   lamoTRIgine   250 mg Oral Daily   levofloxacin  750 mg Oral Daily   linezolid   600 mg Oral Q12H   pantoprazole   40 mg Oral QAC breakfast   rosuvastatin   10 mg Oral Daily    PRN meds: acetaminophen  **OR** acetaminophen , fentaNYL  (SUBLIMAZE ) injection, HYDROcodone-acetaminophen , ondansetron  **OR** ondansetron  (ZOFRAN ) IV, mouth rinse   Infusions:     Antimicrobials: Anti-infectives (From admission, onward)    Start     Dose/Rate Route Frequency Ordered Stop   03/08/24 1200  vancomycin  (VANCOCIN ) IVPB 1000 mg/200 mL premix  Status:  Discontinued        1,000 mg 200 mL/hr over 60 Minutes Intravenous Every 12 hours 03/08/24 0148 03/08/24 0934   03/08/24 1200  linezolid  (ZYVOX ) tablet 600 mg        600 mg Oral Every 12 hours 03/08/24 0934     03/08/24 1200  levofloxacin (LEVAQUIN) tablet 750 mg        750 mg Oral Daily 03/08/24 0934     03/08/24 0600  ceFEPIme (MAXIPIME) 2 g in sodium chloride  0.9 % 100 mL IVPB  Status:  Discontinued        2 g 200 mL/hr over 30 Minutes Intravenous Every 8 hours 03/08/24 0122 03/08/24 0934   03/07/24 2315  vancomycin  (VANCOREADY) IVPB 2000 mg/400 mL        2,000 mg 200 mL/hr over 120 Minutes Intravenous  Once 03/07/24 2311 03/08/24 0204   03/07/24 2315  piperacillin-tazobactam (ZOSYN) IVPB 3.375 g        3.375 g 100 mL/hr over 30 Minutes Intravenous  Once 03/07/24 2311 03/08/24 0005       Objective: Vitals:   03/08/24 1041 03/08/24 1219  BP: 131/83 (!) 149/89  Pulse: 71 73  Resp: 17 18  Temp: 97.7 F (36.5 C) 97.7 F  (36.5 C)  SpO2: 97% 98%    Intake/Output Summary (Last 24 hours) at 03/08/2024 1402 Last data filed at 03/08/2024 0936 Gross per 24 hour  Intake 530 ml  Output 1125 ml  Net -595 ml   Filed Weights   03/08/24 0006  Weight: (!) 137.1 kg   Weight change:  Body mass index is 40.99 kg/m.   Physical Exam: General exam: Pleasant, middle-aged Caucasian male.  Not in distress at rest Skin: No rashes, lesions or ulcers. HEENT: Atraumatic, normocephalic, no obvious bleeding Lungs: Clear to auscultation bilaterally,  CVS: S1, S2, no murmur,   GI/Abd: Soft, nontender, nondistended, bowel sound present,   CNS: Alert, awake, oriented x 3 Psychiatry: Mood appropriate,  Extremities: No pedal edema,  no calf tenderness, left lower leg with large ulcerated wound with soakage over the bandage  Data Review: I have personally reviewed the laboratory data and studies available.  F/u labs  Unresulted Labs (From admission, onward)     Start     Ordered   03/09/24 0500  Basic metabolic panel with GFR  Tomorrow morning,   R        03/08/24 0840   03/09/24 0500  CBC with Differential/Platelet  Tomorrow morning,   R        03/08/24 0840   03/07/24 2341  Hemoglobin A1c  Add-on,   AD       Comments: To assess prior glycemic control    03/07/24 2340   03/07/24 2339  MRSA Next Gen by PCR, Nasal  Once,   URGENT       Question Answer Comment  Patient immune status Normal   Release to patient Immediate      03/07/24 2338   03/07/24 2215  Culture, blood (Routine x 2)  BLOOD CULTURE X 2,   R (with STAT occurrences)      03/07/24 2214             Signed, Hoyt Macleod, MD [facility] 03/08/2024

## 2024-03-08 NOTE — Consult Note (Addendum)
 WOC Nurse Consult Note: this patient is familiar to [facility] team from previous admission with original injury to L lower leg; has been followed at [facility], seen there 03/06/2024 and was last using Vashe WTD dressings 2 times daily  Reason for Consult: L lower leg wound  Wound type: full thickness r/t trauma now infectious  Pressure Injury POA: NA  Measurement: see nursing flowsheet; per [facility] 5/12 6.8 cm x 8.6 cm x 0.7 cm  Wound bed:60% tan fibrinous 40% red moist  Drainage (amount, consistency, odor) foul smelling purulent per  Periwound: erythema and edema  Dressing procedure/placement/frequency:  Cleanse L lower leg wound with Vashe wound cleanser Timm Foot [phone]) do not rinse and allow to air dry.  Apply Vashe moistened gauze to wound bed 2 times daily, using a Q tip applicator to make sure and cover entire wound bed.  Cover with dry gauze and ABD pad.  Secure with Kerlix roll gauze.  Patient has a Tubigrip he uses at home, if he has this with him may use to help secure dressing.    Patient is receiving IV antibiotics for cellulitis to this leg and ID has been consulted.  Patient should resume follow-up with wound care center at discharge for ongoing management of this wound.   POC discussed with bedside nurse. WOC team will not follow. Re-consult if further needs arise.   Thank you,    Ronni Colace MSN, RN-BC, [employer] [phone]

## 2024-03-08 NOTE — Assessment & Plan Note (Signed)
 Contributing to comorbidity and complicating medical management  Body mass index is 40.99 kg/m.  Nutritional follow up as an out pt would be recommended

## 2024-03-08 NOTE — Plan of Care (Signed)
 No acute events overnight. Pt's pain controlled with current regimen. Problem: Coping: Goal: Ability to adjust to condition or change in health will improve Outcome: Progressing   Problem: Fluid Volume: Goal: Ability to maintain a balanced intake and output will improve Outcome: Progressing   Problem: Nutritional: Goal: Maintenance of adequate nutrition will improve Outcome: Progressing   Problem: Clinical Measurements: Goal: Cardiovascular complication will be avoided Outcome: Progressing   Problem: Coping: Goal: Level of anxiety will decrease Outcome: Progressing   Problem: Elimination: Goal: Will not experience complications related to bowel motility Outcome: Progressing Goal: Will not experience complications related to urinary retention Outcome: Progressing   Problem: Pain Managment: Goal: General experience of comfort will improve and/or be controlled Outcome: Progressing

## 2024-03-08 NOTE — Assessment & Plan Note (Signed)
 Continue cpap.

## 2024-03-08 NOTE — Progress Notes (Signed)
   03/08/24 0324  BiPAP/CPAP/SIPAP  BiPAP/CPAP/SIPAP Pt Type Adult  BiPAP/CPAP/SIPAP Resmed  Mask Type Full face mask  Dentures removed? Not applicable  Mask Size Medium  EPAP 7 cmH2O (per pt)  FiO2 (%) 28 %  Flow Rate 2 lpm  Patient Home Machine No  Patient Home Mask No  Patient Home Tubing No  Auto Titrate No  Device Plugged into RED Power Outlet Yes

## 2024-03-08 NOTE — Assessment & Plan Note (Addendum)
-  admit per  cellulitis protocol will        Continue vancomycin  and cefepime      plain films showed:   no evidence of air  no evidence of osteomyelitis   no               foreign   objects          Will obtain MRSA screening,       obtain blood cultures  if febrile or septic     further antibiotic adjustment pending above results Would benefit from ID consult in a.m. sent message to Dr. Levern Reader

## 2024-03-08 NOTE — Assessment & Plan Note (Signed)
-  SIRS criteria met with  elevated white blood cell count,       Component Value Date/Time   WBC 14.1 (H) 03/07/2024 2224   LYMPHSABS 1.6 03/07/2024 2224     tachycardia   ,   Today's Vitals   03/07/24 2217 03/07/24 2230 03/08/24 0006 03/08/24 0009  BP:  111/85    Pulse: (!) 122     Resp:      Temp:      TempSrc:      SpO2: 96%     Weight:   (!) 137.1 kg   Height:   6' (1.829 m)   PainSc:    3    Body mass index is 40.99 kg/m.   The recent clinical data is shown below. Vitals:   03/07/24 2216 03/07/24 2217 03/07/24 2230 03/08/24 0006  BP:   111/85   Pulse: (!) 124 (!) 122    Resp:      Temp:      TempSrc:      SpO2: 95% 96%    Weight:    (!) 137.1 kg  Height:    6' (1.829 m)      -Most likely source being Cellulitis        - Obtain serial lactic acid and procalcitonin level.  - Initiated IV antibiotics in ER: Antibiotics Given (last 72 hours)     Date/Time Action Medication Dose Rate   03/07/24 2359 New Bag/Given   piperacillin-tazobactam (ZOSYN) IVPB 3.375 g 3.375 g 100 mL/hr   03/08/24 0004 New Bag/Given   vancomycin  (VANCOREADY) IVPB 2000 mg/400 mL 2,000 mg 200 mL/hr       Will continue  on :  Cefepime and vancomycin   - await results of blood and urine culture  - Rehydrate aggressively  Intravenous fluids were administered,          30cc/kg fluid    12:40 AM

## 2024-03-08 NOTE — Assessment & Plan Note (Signed)
 -  Order Sensitive  SSI     -  check TSH and HgA1C  - Hold by mouth medications*

## 2024-03-09 ENCOUNTER — Other Ambulatory Visit (HOSPITAL_COMMUNITY): Payer: Self-pay

## 2024-03-09 ENCOUNTER — Inpatient Hospital Stay (HOSPITAL_COMMUNITY)

## 2024-03-09 DIAGNOSIS — A419 Sepsis, unspecified organism: Secondary | ICD-10-CM | POA: Diagnosis not present

## 2024-03-09 LAB — BASIC METABOLIC PANEL WITH GFR
Anion gap: 9 (ref 5–15)
BUN: 14 mg/dL (ref 6–20)
CO2: 24 mmol/L (ref 22–32)
Calcium: 9.1 mg/dL (ref 8.9–10.3)
Chloride: 103 mmol/L (ref 98–111)
Creatinine, Ser: 1.19 mg/dL (ref 0.61–1.24)
GFR, Estimated: >60 mL/min (ref >60)
Glucose, Bld: 106 mg/dL — ABNORMAL HIGH (ref 70–99)
Potassium: 4.3 mmol/L (ref 3.5–5.1)
Sodium: 136 mmol/L (ref 135–145)

## 2024-03-09 LAB — CBC WITH DIFFERENTIAL/PLATELET
Abs Immature Granulocytes: 0.02 10*3/uL (ref 0.00–0.07)
Basophils Absolute: 0.1 10*3/uL (ref 0.0–0.1)
Basophils Relative: 1 %
Eosinophils Absolute: 0.1 10*3/uL (ref 0.0–0.5)
Eosinophils Relative: 1 %
HCT: 41.2 % (ref 39.0–52.0)
Hemoglobin: 13.3 g/dL (ref 13.0–17.0)
Immature Granulocytes: 0 %
Lymphocytes Relative: 20 %
Lymphs Abs: 1.6 10*3/uL (ref 0.7–4.0)
MCH: 29.8 pg (ref 26.0–34.0)
MCHC: 32.3 g/dL (ref 30.0–36.0)
MCV: 92.4 fL (ref 80.0–100.0)
Monocytes Absolute: 0.5 10*3/uL (ref 0.1–1.0)
Monocytes Relative: 6 %
Neutro Abs: 5.6 10*3/uL (ref 1.7–7.7)
Neutrophils Relative %: 72 %
Platelets: 220 10*3/uL (ref 150–400)
RBC: 4.46 MIL/uL (ref 4.22–5.81)
RDW: 14 % (ref 11.5–15.5)
WBC: 7.9 10*3/uL (ref 4.0–10.5)
nRBC: 0 % (ref 0.0–0.2)

## 2024-03-09 LAB — GLUCOSE, CAPILLARY
Glucose-Capillary: 115 mg/dL — ABNORMAL HIGH (ref 70–99)
Glucose-Capillary: 120 mg/dL — ABNORMAL HIGH (ref 70–99)
Glucose-Capillary: 123 mg/dL — ABNORMAL HIGH (ref 70–99)
Glucose-Capillary: 148 mg/dL — ABNORMAL HIGH (ref 70–99)
Glucose-Capillary: 167 mg/dL — ABNORMAL HIGH (ref 70–99)

## 2024-03-09 MED ORDER — OXYCODONE HCL 5 MG PO TABS
5.0000 mg | ORAL_TABLET | Freq: Four times a day (QID) | ORAL | Status: DC | PRN
  Administered 2024-03-09 – 2024-03-13 (×7): 5 mg via ORAL
  Filled 2024-03-09 (×8): qty 1

## 2024-03-09 MED ORDER — GADOBUTROL 1 MMOL/ML IV SOLN
10.0000 mL | Freq: Once | INTRAVENOUS | Status: AC | PRN
Start: 2024-03-09 — End: 2024-03-09
  Administered 2024-03-09: 10 mL via INTRAVENOUS

## 2024-03-09 MED ORDER — ACETAMINOPHEN 500 MG PO TABS
1000.0000 mg | ORAL_TABLET | Freq: Three times a day (TID) | ORAL | Status: DC
  Administered 2024-03-10 – 2024-03-16 (×15): 1000 mg via ORAL
  Filled 2024-03-09 (×18): qty 2

## 2024-03-09 NOTE — Progress Notes (Signed)
 Patient ID: Joshua Conner, male   DOB: [birth year], 53 y.o.   MRN: [id number]  Dr. Gwynneth Lessen has appropriately consulted orthopedic surgery as a relates to the patient's left leg wound and potential abscess.  I was able to review the imaging studies as well as the photographs of the patient's left leg wound.  I did show these to my partner Dr. Julio Ohm who is a wound care specialist.  It looks like this patient will likely need several surgeries on this left leg to deal with this wound.  With that being said, he needs to be transferred today to [facility] in anticipation of surgery tomorrow on his left leg.  Dr. Julio Ohm is aware of this as well.  The patient should be n.p.o. after midnight tonight.

## 2024-03-09 NOTE — Consult Note (Signed)
 [facility]    Date of Admission:  03/07/2024   Total days of inpatient antibiotics 1        Reason for Consult: wound infeciton    Principal Problem:   Sepsis (HCC) Active Problems:   Cellulitis of left lower extremity   Controlled type 2 diabetes mellitus without complication, without long-term current use of insulin  (HCC)   Hyperlipidemia   Cellulitis   History of seizure disorder   CKD stage 3a, GFR 45-59 ml/min (HCC)   Obesity, Class III, BMI 40-49.9 (morbid obesity)   OSA (obstructive sleep apnea)   Assessment: 53 year old male with past medical history of diabetes, hypertension, OSA on CPAP, varicose vein status post ablation initially had a left leg injury with cord swelling on that on 4/10.  Has been on multiple courses of antibiotics.  Required hospitalization for wound 4/21-4/25 and discharged on linezolid  and Augmentin  complete 14 days.  Readmitted for erythema around wound  #Shin wound status post debridement - Patient states that he has to pack his own wound, he is unable to do so properly at home.  I suspect this may have introduced bacteria.  He also notes that following his injury he had gone to the beach. - I will add pseudomonal coverage given BH exposure.  Plan on 7 days antibiotics additionally. #Diabetes mellitus - A1c 7 on 5/13 Recommendations:  -Levaqyin 750 mg po every day and linezolid  600 mgpo bid x 7 days -F/U with ID on 6/3 -Follow blood Cx -Pt needs at home wound care instructions, he is unable to pack his own wound bid.. Continue to follow with wound care outpatient.  -ID will sing off  Evaluation of this patient requires complex antimicrobial therapy evaluation and counseling + isolation needs for disease transmission risk assessment and mitigation    Microbiology:   Antibiotics: Pip-tazo 5/13- Cefepime vanco  Cultures: Blood 5/13 Urine  Other   HPI: Joshua Conner is a 53 y.o. male with past  medical history of diabetes type 2, hypertension, hyperlipidemia, morbid obesity, OSA CPAP, seizure disorder, GERD, meningioma, varicose veins status post ablation, left leg injury after piece of courts fell on it 1/10 he has been on multiple courses of antibiotics and seen by infectious disease during last hospitalization. In where he was treated with vancomycin  ceftriaxone  initially and then transition to linezolid  and Augmentin  to complete 14-day course.  He was discharged on 4/25 and seen by wound cares few times was placed back on doxycycline  and Augmentin .  He underwent debridement this week and developed erythema at wound site.  Started on cefepime and vancomycin .  ID engaged for antibiotic recommendations.   Review of Systems: Review of Systems  All other systems reviewed and are negative.   Past Medical History:  Diagnosis Date   Acid reflux    Diabetes mellitus without complication (HCC)    Seizures (HCC)    Varicose veins     Social History   Tobacco Use   Smoking status: Never  Substance Use Topics   Alcohol use: Yes    Alcohol/week: 12.0 standard drinks of alcohol    Types: 12 Cans of beer per week    Comment: approx 6 beers a week   Drug use: No    Family History  Problem Relation Age of Onset   Hypertension Mother    COPD Mother    Cancer Mother 68       breast   Hypertension  Father    Hypertension Sister    Scheduled Meds:  empagliflozin  25 mg Oral Daily   enoxaparin (LOVENOX) injection  70 mg Subcutaneous Daily   insulin  aspart  0-9 Units Subcutaneous Q4H   lamoTRIgine   250 mg Oral Daily   levofloxacin  750 mg Oral Daily   linezolid   600 mg Oral Q12H   pantoprazole   40 mg Oral QAC breakfast   rosuvastatin   10 mg Oral Daily   Continuous Infusions: PRN Meds:.acetaminophen  **OR** acetaminophen , HYDROcodone-acetaminophen , morphine  injection, ondansetron  **OR** ondansetron  (ZOFRAN ) IV, mouth rinse No Known Allergies  OBJECTIVE: Blood pressure 121/74,  pulse 81, temperature 97.8 F (36.6 C), temperature source Oral, resp. rate 20, height 6' (1.829 m), weight (!) 137.1 kg, SpO2 96%.  Physical Exam Constitutional:      General: He is not in acute distress.    Appearance: He is normal weight. He is not toxic-appearing.  HENT:     Head: Normocephalic and atraumatic.     Right Ear: External ear normal.     Left Ear: External ear normal.     Nose: No congestion or rhinorrhea.     Mouth/Throat:     Mouth: Mucous membranes are moist.     Pharynx: Oropharynx is clear.  Eyes:     Extraocular Movements: Extraocular movements intact.     Conjunctiva/sclera: Conjunctivae normal.     Pupils: Pupils are equal, round, and reactive to light.  Cardiovascular:     Rate and Rhythm: Normal rate and regular rhythm.     Heart sounds: No murmur heard.    No friction rub. No gallop.  Pulmonary:     Effort: Pulmonary effort is normal.     Breath sounds: Normal breath sounds.  Abdominal:     General: Abdomen is flat. Bowel sounds are normal.     Palpations: Abdomen is soft.  Musculoskeletal:        General: No swelling.     Cervical back: Normal range of motion and neck supple.  Skin:    General: Skin is warm and dry.  Neurological:     General: No focal deficit present.     Mental Status: He is oriented to person, place, and time.  Psychiatric:        Mood and Affect: Mood normal.     Lab Results Lab Results  Component Value Date   WBC 9.5 03/08/2024   HGB 11.7 (L) 03/08/2024   HCT 36.8 (L) 03/08/2024   MCV 93.2 03/08/2024   PLT 207 03/08/2024    Lab Results  Component Value Date   CREATININE 1.12 03/08/2024   BUN 18 03/08/2024   NA 140 03/08/2024   K 3.9 03/08/2024   CL 109 03/08/2024   CO2 24 03/08/2024    Lab Results  Component Value Date   ALT 21 03/08/2024   AST 11 (L) 03/08/2024   ALKPHOS 50 03/08/2024   BILITOT 0.7 03/08/2024       Orlie Bjornstad, MD [facility] 03/09/2024, 6:04 AM

## 2024-03-09 NOTE — TOC Progression Note (Signed)
 Transition of Care [facility]) - Progression Note    Patient Details  Name: Joshua Conner MRN: [id number] Date of Birth: February 05, 1971  Transition of [facility]) CM/SW Contact  Marty Sleet, LCSW Phone Number: 03/09/2024, 2:40 PM  Clinical Narrative:    [facility] consulted or home health nursing for wound care. Met with pt who is agreeable to have HHRN arranged and does not have an agency preference. HHRN has been arranged with [facility]. HH order will need to be placed prior to discharge. Pt planned to transfer to [facility] for surgery. [facility] to continue to follow for discharge.         Expected Discharge Plan and Services                                   HH Arranged: RN [facility] Agency: [facility] Date [facility] Agency Contacted: 03/09/24 Time HH Agency Contacted: [phone] Representative spoke with at [facility] Agency: Amy   Social Determinants of Health (SDOH) Interventions SDOH Screenings   Food Insecurity: No Food Insecurity (03/08/2024)  Housing: Low Risk  (03/08/2024)  Transportation Needs: No Transportation Needs (03/08/2024)  Utilities: Not At Risk (03/08/2024)  Social Connections: Socially Integrated (03/08/2024)  Tobacco Use: Unknown (03/08/2024)    Readmission Risk Interventions    03/09/2024    2:40 PM 03/08/2024    2:07 PM  Readmission Risk Prevention Plan  Post Dischage Appt Complete   Medication Screening Complete   Transportation Screening Complete Complete  PCP or Specialist Appt within 5-7 Days  Complete  Home Care Screening  Complete  Medication Review (RN CM)  Complete

## 2024-03-09 NOTE — Plan of Care (Signed)
  Problem: Education: Goal: Ability to describe self-care measures that may prevent or decrease complications (Diabetes Survival Skills Education) will improve Outcome: Progressing Goal: Individualized Educational Video(s) Outcome: Progressing   Problem: Coping: Goal: Ability to adjust to condition or change in health will improve Outcome: Progressing   Problem: Fluid Volume: Goal: Ability to maintain a balanced intake and output will improve Outcome: Progressing   Problem: Health Behavior/Discharge Planning: Goal: Ability to identify and utilize available resources and services will improve Outcome: Progressing Goal: Ability to manage health-related needs will improve Outcome: Progressing   Problem: Metabolic: Goal: Ability to maintain appropriate glucose levels will improve Outcome: Progressing   Problem: Nutritional: Goal: Maintenance of adequate nutrition will improve Outcome: Progressing Goal: Progress toward achieving an optimal weight will improve Outcome: Progressing   Problem: Skin Integrity: Goal: Risk for impaired skin integrity will decrease Outcome: Progressing   Problem: Tissue Perfusion: Goal: Adequacy of tissue perfusion will improve Outcome: Progressing   Problem: Education: Goal: Knowledge of General Education information will improve Description: Including pain rating scale, medication(s)/side effects and non-pharmacologic comfort measures Outcome: Progressing   Problem: Health Behavior/Discharge Planning: Goal: Ability to manage health-related needs will improve Outcome: Progressing   Problem: Clinical Measurements: Goal: Ability to maintain clinical measurements within normal limits will improve Outcome: Progressing Goal: Will remain free from infection Outcome: Progressing Goal: Diagnostic test results will improve Outcome: Progressing Goal: Respiratory complications will improve Outcome: Progressing Goal: Cardiovascular complication will  be avoided Outcome: Progressing   Problem: Activity: Goal: Risk for activity intolerance will decrease Outcome: Progressing   Problem: Nutrition: Goal: Adequate nutrition will be maintained Outcome: Progressing   Problem: Coping: Goal: Level of anxiety will decrease Outcome: Progressing   Problem: Elimination: Goal: Will not experience complications related to bowel motility Outcome: Progressing Goal: Will not experience complications related to urinary retention Outcome: Progressing   Problem: Pain Managment: Goal: General experience of comfort will improve and/or be controlled Outcome: Progressing   Problem: Safety: Goal: Ability to remain free from injury will improve Outcome: Progressing   Problem: Skin Integrity: Goal: Risk for impaired skin integrity will decrease Outcome: Progressing   Problem: Clinical Measurements: Goal: Ability to avoid or minimize complications of infection will improve Outcome: Progressing   Problem: Skin Integrity: Goal: Skin integrity will improve Outcome: Progressing   Problem: Fluid Volume: Goal: Hemodynamic stability will improve Outcome: Progressing   Problem: Clinical Measurements: Goal: Diagnostic test results will improve Outcome: Progressing Goal: Signs and symptoms of infection will decrease Outcome: Progressing   Problem: Respiratory: Goal: Ability to maintain adequate ventilation will improve Outcome: Progressing

## 2024-03-09 NOTE — Anesthesia Preprocedure Evaluation (Signed)
 Anesthesia Evaluation  Patient identified by MRN, date of birth, ID band Patient awake    Reviewed: Allergy & Precautions, NPO status , Patient's Chart, lab work & pertinent test results  History of Anesthesia Complications Negative for: history of anesthetic complications  Airway Mallampati: III  TM Distance: >3 FB Neck ROM: Full   Comment: Previous grade I view with MAC 4, easy mask Dental  (+) Dental Advisory Given   Pulmonary neg shortness of breath, sleep apnea and Continuous Positive Airway Pressure Ventilation , neg COPD, neg recent URI   Pulmonary exam normal breath sounds clear to auscultation       Cardiovascular (-) hypertension(-) angina (-) Past MI, (-) Cardiac Stents and (-) CABG (-) dysrhythmias  Rhythm:Regular Rate:Normal  HLD  TTE 04/08/2023: SUMMARY  There is mild concentric left ventricular hypertrophy.  LV ejection fraction = 55-60%.  The ascending aorta measures 4 cm  1.5 cm-m2 , the aortic sinuses measures 4.2cm  1.6 cm-m2 .  There is no significant valvular stenosis or regurgitation.  There is no comparison study available.     Neuro/Psych Seizures - (on Lamotrigine , last one 12 years ago), Well Controlled,  Meningioma     GI/Hepatic Neg liver ROS,GERD  Medicated,,  Endo/Other  diabetes, Type 2  Class 3 obesity  Renal/GU CRFRenal disease     Musculoskeletal   Abdominal  (+) + obese  Peds  Hematology negative hematology ROS (+) Lab Results      Component                Value               Date                      WBC                      7.9                 03/09/2024                HGB                      13.3                03/09/2024                HCT                      41.2                03/09/2024                MCV                      92.4                03/09/2024                PLT                      220                 03/09/2024              Anesthesia Other Findings    Reproductive/Obstetrics  Anesthesia Physical Anesthesia Plan  ASA: 3  Anesthesia Plan: General   Post-op Pain Management: Tylenol  PO (pre-op)*   Induction: Intravenous  PONV Risk Score and Plan: 2 and Ondansetron , Dexamethasone and Treatment may vary due to age or medical condition  Airway Management Planned: LMA  Additional Equipment:   Intra-op Plan:   Post-operative Plan: Extubation in OR  Informed Consent: I have reviewed the patients History and Physical, chart, labs and discussed the procedure including the risks, benefits and alternatives for the proposed anesthesia with the patient or authorized representative who has indicated his/her understanding and acceptance.     Dental advisory given  Plan Discussed with: CRNA and Anesthesiologist  Anesthesia Plan Comments: (Risks of general anesthesia discussed including, but not limited to, sore throat, hoarse voice, chipped/damaged teeth, injury to vocal cords, nausea and vomiting, allergic reactions, lung infection, heart attack, stroke, and death. All questions answered. )       Anesthesia Quick Evaluation

## 2024-03-09 NOTE — Progress Notes (Addendum)
 PROGRESS NOTE  PIKE AVELLA  DOB: [birth year]  PCP: Jimmey Mould, MD ZOX:[id number]  DOA: 03/07/2024  LOS: 2 days  Hospital Day: 3  Brief narrative: Joshua Conner is a 53 y.o. male with PMH significant for DM2, HTN, HLD, morbid obesity, OSA on CPAP, seizure disorder, GERD, meningioma, varicose veins s/p ablation. On 02/03/24, while at work, piece of quartz fell on his left leg and sustained traumatic laceration.  The wound got infected eventually.  Since then, patient has had multiple courses of antibiotics, evaluation of the wound care center and also hospitalization for IV antibiotics in April. 5/12, 1 day prior to presentation, seen at wound care center. Underwent debridement. 5/13, patient noticed increasing redness around the wound and purulent drainage from the wound itself.  Later in the day, he had a scheduled appointment with ID but prior to their appointment, patient presented to the ED with foul-smelling drainage, subjective fever, nausea, vomiting and a concern of recurrent infection of chronic wound.   In the ED, patient was afebrile but tachycardic to 130s, blood pressure maintained Labs with WBC of 14.1, glucose 218, creatinine 1.3, lactic acid normal at 1.4 Left tibia/fibula x-ray negative for any osteomyelitis Blood culture was sent Patient was started on broad-spectrum IV antibiotic coverage with IV cefepime, IV Zosyn, IV vancomycin  Admitted to [facility] ID and wound care consulted  Subjective: Patient was seen and examined this morning.   Lying down in bed. Continues to complain of pain in left shin from knee to ankle.  Wound remains bandaged No fever in last 24 hours.  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  Tachycardic to 130s -met sepsis criteria Left tibia/fibula x-ray negative for any osteomyelitis Blood culture was sent and patient was started on broad-spectrum antibiotics. ID and wound care services were consulted. Per ID recommendation, patient was switched to oral Levaquin 750 mg daily and Zyvox  600 mg twice daily for 7 days.  To follow-up with ID as an outpatient on 6/3. Patient still continues to complain of pain on the left from the knee to ankle and wants any imaging done.  It seems she was already planned for CT scan by his outpatient provider.  After discussion with patient this morning, I ordered for MRI left lower extremity. MRI resulted showing an underlying abscess. I called orthopedic consultation.  Discussed with Dr. Lucienne Ryder.  We looked at the wound photos and MRI. Recommended transfer to [facility] to be seen by Dr. Julio Ohm for potential need of surgical debridements. Transfer order in. Recent Labs  Lab 03/07/24 2224 03/07/24 2233 03/08/24 0554 03/09/24 0553  WBC 14.1*  --  9.5 7.9  LATICACIDVEN  --  1.4  --   --    Type 2 diabetes mellitus A1c 6.3 from April 2025 PTA meds-Jardiance 25 mg daily. Currently on SSI/Accu-Cheks.  Resume Jardiance Recent Labs  Lab 03/08/24 2028 03/08/24 2307 03/09/24 0459 03/09/24 0716 03/09/24 1228  GLUCAP 128* 119* 115* 120* 123*   HLD Crestor   H/o seizure disorder Continue Lamictal   Morbid Obesity  Body mass index is 40.99 kg/m. Patient has been advised to make an attempt to  improve diet and exercise patterns to aid in weight loss.  OSA On CPAP  GERD PPI   H/o varicose vein s/p ablation  several years ago on both legs     Mobility: Encourage ambulation  Goals of care   Code Status: Full Code   DVT prophylaxis:  SCDs Start: 03/08/24 0114   Antimicrobials:  Oral Levaquin and oral Zyvox  Fluid: Noted on IV hydration Consultants: ID Family Communication: None at bedside  Status: Inpatient Level of care:  Med-Surg   Patient is from: Home Needs to continue in-hospital care: Pending MRI Anticipated d/c to: Pending clinical course      Diet:  Diet Order             Diet Carb Modified Fluid consistency: Thin; Room service appropriate? Yes  Diet effective now                   Scheduled Meds:  acetaminophen   1,000 mg Oral Q8H   empagliflozin  25 mg Oral Daily   enoxaparin (LOVENOX) injection  70 mg Subcutaneous Daily   insulin  aspart  0-9 Units Subcutaneous Q4H   lamoTRIgine   250 mg Oral Daily   levofloxacin  750 mg Oral Daily   linezolid   600 mg Oral Q12H   pantoprazole   40 mg Oral QAC breakfast   rosuvastatin   10 mg Oral Daily    PRN meds: morphine  injection, ondansetron  **OR** ondansetron  (ZOFRAN ) IV, mouth rinse, oxyCODONE    Infusions:     Antimicrobials: Anti-infectives (From admission, onward)    Start     Dose/Rate Route Frequency Ordered Stop   03/08/24 1200  vancomycin  (VANCOCIN ) IVPB 1000 mg/200 mL premix  Status:  Discontinued        1,000 mg 200 mL/hr over 60 Minutes Intravenous Every 12 hours 03/08/24 0148 03/08/24 0934   03/08/24 1200  linezolid  (ZYVOX ) tablet 600 mg        600 mg Oral Every 12 hours 03/08/24 0934     03/08/24 1200  levofloxacin (LEVAQUIN) tablet 750 mg        750 mg Oral Daily 03/08/24 0934     03/08/24 0600  ceFEPIme (MAXIPIME) 2 g in sodium chloride  0.9 % 100 mL IVPB  Status:  Discontinued        2 g 200 mL/hr over 30 Minutes Intravenous Every 8 hours 03/08/24 0122 03/08/24 0934   03/07/24 2315  vancomycin  (VANCOREADY) IVPB 2000 mg/400 mL        2,000 mg 200 mL/hr over 120 Minutes Intravenous  Once 03/07/24 2311 03/08/24 0204   03/07/24 2315  piperacillin-tazobactam (ZOSYN) IVPB 3.375 g        3.375 g 100 mL/hr over 30 Minutes Intravenous  Once 03/07/24 2311 03/08/24 0005        Objective: Vitals:   03/09/24 0502 03/09/24 1404  BP: 121/74 133/87  Pulse: 81 87  Resp: 20 17  Temp: 97.8 F (36.6 C) 98 F (36.7 C)  SpO2: 96% 97%    Intake/Output Summary (Last 24 hours) at 03/09/2024 1407 Last data filed at 03/09/2024 0904 Gross per 24 hour  Intake --  Output 3300 ml  Net -3300 ml   Filed Weights   03/08/24 0006  Weight: (!) 137.1 kg   Weight change:  Body mass index is 40.99 kg/m.   Physical Exam: General exam: Pleasant, middle-aged Caucasian male.  Complains of intermittent burning pain in the left skin bone Skin: No rashes, lesions or ulcers. HEENT: Atraumatic, normocephalic, no obvious  bleeding Lungs: Clear to auscultation bilaterally,  CVS: S1, S2, no murmur,   GI/Abd: Soft, nontender, nondistended, bowel sound present,   CNS: Alert, awake, oriented x 3 Psychiatry: Mood appropriate,  Extremities: No pedal edema, no calf tenderness, left lower leg with large ulcerated wound with a bandage over it  Data Review: I have personally reviewed the laboratory data and studies available.  F/u labs  Unresulted Labs (From admission, onward)    None        Signed, Hoyt Macleod, MD [facility] 03/09/2024

## 2024-03-09 NOTE — TOC Benefit Eligibility Note (Signed)
 Pharmacy Patient Advocate Encounter  Insurance verification completed.    The patient is insured through [employer]. Patient has [employer], may use a copay card, and/or apply for patient assistance if available.    Ran test claim for Linezolid  600mg  and the current 7 day co-pay is $0.  Ran test claim for Levofloxacin 750mg  and the current 7 day co-pay is $0.   This test claim was processed through [employer]- copay amounts may vary at other pharmacies due to [employer], or as the patient moves through the different stages of their insurance plan.

## 2024-03-10 ENCOUNTER — Inpatient Hospital Stay (HOSPITAL_COMMUNITY): Payer: Self-pay | Admitting: Anesthesiology

## 2024-03-10 ENCOUNTER — Encounter (HOSPITAL_COMMUNITY)
Admission: EM | Disposition: A | Discharge status: Home Health | Payer: Self-pay | Source: Home / Self Care | Attending: Internal Medicine

## 2024-03-10 ENCOUNTER — Encounter (HOSPITAL_COMMUNITY): Payer: Self-pay | Admitting: Internal Medicine

## 2024-03-10 DIAGNOSIS — L97924 Non-pressure chronic ulcer of unspecified part of left lower leg with necrosis of bone: Secondary | ICD-10-CM

## 2024-03-10 DIAGNOSIS — D72829 Elevated white blood cell count, unspecified: Secondary | ICD-10-CM

## 2024-03-10 DIAGNOSIS — L03116 Cellulitis of left lower limb: Secondary | ICD-10-CM | POA: Diagnosis not present

## 2024-03-10 DIAGNOSIS — N1831 Chronic kidney disease, stage 3a: Secondary | ICD-10-CM

## 2024-03-10 DIAGNOSIS — L97229 Non-pressure chronic ulcer of left calf with unspecified severity: Secondary | ICD-10-CM | POA: Diagnosis not present

## 2024-03-10 DIAGNOSIS — G4733 Obstructive sleep apnea (adult) (pediatric): Secondary | ICD-10-CM

## 2024-03-10 DIAGNOSIS — L02416 Cutaneous abscess of left lower limb: Secondary | ICD-10-CM

## 2024-03-10 DIAGNOSIS — E785 Hyperlipidemia, unspecified: Secondary | ICD-10-CM | POA: Diagnosis not present

## 2024-03-10 DIAGNOSIS — B9689 Other specified bacterial agents as the cause of diseases classified elsewhere: Secondary | ICD-10-CM | POA: Diagnosis not present

## 2024-03-10 HISTORY — PX: APPLICATION OF WOUND VAC: SHX5189

## 2024-03-10 HISTORY — PX: INCISION AND DRAINAGE OF DEEP ABSCESS, CALF: SHX7361

## 2024-03-10 LAB — GLUCOSE, CAPILLARY
Glucose-Capillary: 127 mg/dL — ABNORMAL HIGH (ref 70–99)
Glucose-Capillary: 129 mg/dL — ABNORMAL HIGH (ref 70–99)
Glucose-Capillary: 129 mg/dL — ABNORMAL HIGH (ref 70–99)
Glucose-Capillary: 133 mg/dL — ABNORMAL HIGH (ref 70–99)
Glucose-Capillary: 140 mg/dL — ABNORMAL HIGH (ref 70–99)
Glucose-Capillary: 151 mg/dL — ABNORMAL HIGH (ref 70–99)
Glucose-Capillary: 203 mg/dL — ABNORMAL HIGH (ref 70–99)
Glucose-Capillary: 224 mg/dL — ABNORMAL HIGH (ref 70–99)

## 2024-03-10 LAB — MRSA NEXT GEN BY PCR, NASAL: MRSA by PCR Next Gen: NOT DETECTED

## 2024-03-10 SURGERY — INCISION AND DRAINAGE OF DEEP ABSCESS, CALF
Anesthesia: General | Site: Leg Lower | Laterality: Left

## 2024-03-10 MED ORDER — ORAL CARE MOUTH RINSE: 15.0000 mL | Freq: Once | OROMUCOSAL | Status: AC

## 2024-03-10 MED ORDER — OXYCODONE HCL 5 MG PO TABS: 5.0000 mg | ORAL_TABLET | Freq: Once | ORAL | Status: DC | PRN

## 2024-03-10 MED ORDER — AMISULPRIDE (ANTIEMETIC) 5 MG/2ML IV SOLN: 10.0000 mg | Freq: Once | INTRAVENOUS | Status: DC | PRN

## 2024-03-10 MED ORDER — METOCLOPRAMIDE HCL 5 MG/ML IJ SOLN: 5.0000 mg | Freq: Three times a day (TID) | INTRAMUSCULAR | Status: DC | PRN

## 2024-03-10 MED ORDER — VASHE WOUND IRRIGATION OPTIME
TOPICAL | Status: DC | PRN
  Administered 2024-03-10: 34 [oz_av]

## 2024-03-10 MED ORDER — LACTATED RINGERS IV SOLN: INTRAVENOUS | Status: DC

## 2024-03-10 MED ORDER — MIDAZOLAM HCL 2 MG/2ML IJ SOLN
INTRAMUSCULAR | Status: AC
  Filled 2024-03-10: qty 2

## 2024-03-10 MED ORDER — ONDANSETRON HCL 4 MG/2ML IJ SOLN
INTRAMUSCULAR | Status: AC
  Filled 2024-03-10: qty 2

## 2024-03-10 MED ORDER — PROPOFOL 10 MG/ML IV BOLUS
INTRAVENOUS | Status: AC
  Filled 2024-03-10: qty 20

## 2024-03-10 MED ORDER — ACETAMINOPHEN 325 MG PO TABS
325.0000 mg | ORAL_TABLET | Freq: Four times a day (QID) | ORAL | Status: DC | PRN
  Administered 2024-03-15: 650 mg via ORAL
  Filled 2024-03-10: qty 2

## 2024-03-10 MED ORDER — MIDAZOLAM HCL 2 MG/2ML IJ SOLN
INTRAMUSCULAR | Status: DC | PRN
  Administered 2024-03-10: 2 mg via INTRAVENOUS

## 2024-03-10 MED ORDER — DEXAMETHASONE SODIUM PHOSPHATE 10 MG/ML IJ SOLN
INTRAMUSCULAR | Status: DC | PRN
Start: 2024-03-10 — End: 2024-03-10
  Administered 2024-03-10: 10 mg via INTRAVENOUS

## 2024-03-10 MED ORDER — HYDROMORPHONE HCL 1 MG/ML IJ SOLN
0.5000 mg | INTRAMUSCULAR | Status: DC | PRN
  Administered 2024-03-10 – 2024-03-15 (×3): 1 mg via INTRAVENOUS
  Filled 2024-03-10 (×3): qty 1

## 2024-03-10 MED ORDER — SODIUM CHLORIDE 0.9% FLUSH: 3.0000 mL | INTRAVENOUS | Status: DC | PRN

## 2024-03-10 MED ORDER — ONDANSETRON HCL 4 MG/2ML IJ SOLN
INTRAMUSCULAR | Status: DC | PRN
  Administered 2024-03-10: 4 mg via INTRAVENOUS

## 2024-03-10 MED ORDER — FENTANYL CITRATE (PF) 100 MCG/2ML IJ SOLN
25.0000 ug | INTRAMUSCULAR | Status: DC | PRN
  Administered 2024-03-10 (×3): 50 ug via INTRAVENOUS

## 2024-03-10 MED ORDER — FENTANYL CITRATE (PF) 250 MCG/5ML IJ SOLN
INTRAMUSCULAR | Status: AC
  Filled 2024-03-10: qty 5

## 2024-03-10 MED ORDER — VANCOMYCIN HCL 1.25 G IV SOLR
1250.0000 mg | Freq: Two times a day (BID) | INTRAVENOUS | Status: DC
  Filled 2024-03-10 (×2): qty 25

## 2024-03-10 MED ORDER — INSULIN ASPART 100 UNIT/ML IJ SOLN: 0.0000 [IU] | INTRAMUSCULAR | Status: DC | PRN

## 2024-03-10 MED ORDER — FENTANYL CITRATE (PF) 100 MCG/2ML IJ SOLN
INTRAMUSCULAR | Status: AC
  Filled 2024-03-10: qty 2

## 2024-03-10 MED ORDER — FENTANYL CITRATE (PF) 250 MCG/5ML IJ SOLN
INTRAMUSCULAR | Status: DC | PRN
  Administered 2024-03-10 (×3): 50 ug via INTRAVENOUS

## 2024-03-10 MED ORDER — POLYETHYLENE GLYCOL 3350 17 G PO PACK
17.0000 g | PACK | Freq: Every day | ORAL | Status: DC | PRN
  Administered 2024-03-11: 17 g via ORAL
  Filled 2024-03-10: qty 1

## 2024-03-10 MED ORDER — 0.9 % SODIUM CHLORIDE (POUR BTL) OPTIME
TOPICAL | Status: DC | PRN
  Administered 2024-03-10: 1000 mL

## 2024-03-10 MED ORDER — LIDOCAINE 2% (20 MG/ML) 5 ML SYRINGE
INTRAMUSCULAR | Status: AC
  Filled 2024-03-10: qty 5

## 2024-03-10 MED ORDER — CEFAZOLIN SODIUM-DEXTROSE 3-4 GM/150ML-% IV SOLN
INTRAVENOUS | Status: AC
  Filled 2024-03-10: qty 150

## 2024-03-10 MED ORDER — METHOCARBAMOL 500 MG PO TABS
500.0000 mg | ORAL_TABLET | Freq: Four times a day (QID) | ORAL | Status: DC | PRN
  Administered 2024-03-10 – 2024-03-16 (×16): 500 mg via ORAL
  Filled 2024-03-10 (×16): qty 1

## 2024-03-10 MED ORDER — CHLORHEXIDINE GLUCONATE 0.12 % MT SOLN: 15.0000 mL | Freq: Once | OROMUCOSAL | Status: AC

## 2024-03-10 MED ORDER — CHLORHEXIDINE GLUCONATE 0.12 % MT SOLN
OROMUCOSAL | Status: AC
Start: 2024-03-10 — End: 2024-03-10
  Administered 2024-03-10: 15 mL via OROMUCOSAL
  Filled 2024-03-10: qty 15

## 2024-03-10 MED ORDER — POVIDONE-IODINE 10 % EX SWAB
2.0000 | Freq: Once | CUTANEOUS | Status: AC
  Administered 2024-03-10: 2 via TOPICAL

## 2024-03-10 MED ORDER — METHOCARBAMOL 1000 MG/10ML IJ SOLN: 500.0000 mg | Freq: Four times a day (QID) | INTRAMUSCULAR | Status: DC | PRN

## 2024-03-10 MED ORDER — DEXAMETHASONE SODIUM PHOSPHATE 10 MG/ML IJ SOLN
INTRAMUSCULAR | Status: AC
  Filled 2024-03-10: qty 1

## 2024-03-10 MED ORDER — HYDROMORPHONE HCL 1 MG/ML IJ SOLN
INTRAMUSCULAR | Status: AC
  Filled 2024-03-10: qty 1

## 2024-03-10 MED ORDER — VASOPRESSIN 20 UNIT/ML IV SOLN
INTRAVENOUS | Status: AC
  Filled 2024-03-10: qty 1

## 2024-03-10 MED ORDER — KETOROLAC TROMETHAMINE 30 MG/ML IJ SOLN
INTRAMUSCULAR | Status: AC
  Filled 2024-03-10: qty 1

## 2024-03-10 MED ORDER — CEFEPIME HCL 2 G IV SOLR
2.0000 g | Freq: Three times a day (TID) | INTRAVENOUS | Status: DC
  Administered 2024-03-10 – 2024-03-11 (×2): 2 g via INTRAVENOUS
  Filled 2024-03-10 (×2): qty 12.5

## 2024-03-10 MED ORDER — MAGNESIUM CITRATE PO SOLN: 1.0000 | Freq: Once | ORAL | Status: DC | PRN

## 2024-03-10 MED ORDER — SODIUM CHLORIDE 0.9 % IR SOLN
Status: DC | PRN
  Administered 2024-03-10: 1000 mL

## 2024-03-10 MED ORDER — KETOROLAC TROMETHAMINE 30 MG/ML IJ SOLN
INTRAMUSCULAR | Status: DC | PRN
  Administered 2024-03-10: 30 mg via INTRAVENOUS

## 2024-03-10 MED ORDER — HYDROMORPHONE HCL 1 MG/ML IJ SOLN: 0.2500 mg | INTRAMUSCULAR | Status: DC | PRN

## 2024-03-10 MED ORDER — BISACODYL 10 MG RE SUPP: 10.0000 mg | Freq: Every day | RECTAL | Status: DC | PRN

## 2024-03-10 MED ORDER — LIDOCAINE 2% (20 MG/ML) 5 ML SYRINGE
INTRAMUSCULAR | Status: DC | PRN
  Administered 2024-03-10: 100 mg via INTRAVENOUS

## 2024-03-10 MED ORDER — METOCLOPRAMIDE HCL 5 MG PO TABS: 5.0000 mg | ORAL_TABLET | Freq: Three times a day (TID) | ORAL | Status: DC | PRN

## 2024-03-10 MED ORDER — SODIUM CHLORIDE 0.9% FLUSH
3.0000 mL | Freq: Two times a day (BID) | INTRAVENOUS | Status: DC
  Administered 2024-03-10 – 2024-03-13 (×5): 10 mL via INTRAVENOUS

## 2024-03-10 MED ORDER — OXYCODONE HCL 5 MG/5ML PO SOLN: 5.0000 mg | Freq: Once | ORAL | Status: DC | PRN

## 2024-03-10 MED ORDER — VANCOMYCIN HCL 10 G IV SOLR
2500.0000 mg | Freq: Once | INTRAVENOUS | Status: AC
  Administered 2024-03-10: 2500 mg via INTRAVENOUS
  Filled 2024-03-10: qty 2500

## 2024-03-10 MED ORDER — PROPOFOL 10 MG/ML IV BOLUS
INTRAVENOUS | Status: DC | PRN
  Administered 2024-03-10: 200 mg via INTRAVENOUS

## 2024-03-10 MED ORDER — HYDROMORPHONE HCL 1 MG/ML IJ SOLN
0.2500 mg | INTRAMUSCULAR | Status: DC | PRN
  Administered 2024-03-10 (×6): 0.5 mg via INTRAVENOUS

## 2024-03-10 MED ORDER — DOCUSATE SODIUM 100 MG PO CAPS
100.0000 mg | ORAL_CAPSULE | Freq: Two times a day (BID) | ORAL | Status: DC
  Administered 2024-03-10 – 2024-03-16 (×11): 100 mg via ORAL
  Filled 2024-03-10 (×11): qty 1

## 2024-03-10 MED ORDER — CEFAZOLIN SODIUM-DEXTROSE 3-4 GM/150ML-% IV SOLN
3.0000 g | INTRAVENOUS | Status: AC
  Administered 2024-03-10: 3 g via INTRAVENOUS

## 2024-03-10 MED ORDER — ACETAMINOPHEN 500 MG PO TABS
1000.0000 mg | ORAL_TABLET | Freq: Once | ORAL | Status: AC
  Administered 2024-03-10: 1000 mg via ORAL

## 2024-03-10 MED ORDER — CHLORHEXIDINE GLUCONATE 4 % EX SOLN
60.0000 mL | Freq: Once | CUTANEOUS | Status: DC
Start: 2024-03-10 — End: 2024-03-10

## 2024-03-10 SURGICAL SUPPLY — 41 items
BAG COUNTER SPONGE SURGICOUNT (BAG) IMPLANT
BLADE SURG 21 STRL SS (BLADE) ×1 IMPLANT
BNDG COHESIVE 4X5 TAN STRL LF (GAUZE/BANDAGES/DRESSINGS) IMPLANT
BNDG COHESIVE 6X5 TAN NS LF (GAUZE/BANDAGES/DRESSINGS) IMPLANT
BNDG COHESIVE 6X5 TAN ST LF (GAUZE/BANDAGES/DRESSINGS) IMPLANT
BNDG GAUZE DERMACEA FLUFF 4 (GAUZE/BANDAGES/DRESSINGS) IMPLANT
CANISTER WOUNDNEG PRESSURE 500 (CANNISTER) IMPLANT
CLEANSER WND VASHE 34 (WOUND CARE) IMPLANT
CLEANSER WND VASHE INSTL 34OZ (WOUND CARE) IMPLANT
CNTNR URN SCR LID CUP LEK RST (MISCELLANEOUS) IMPLANT
COVER SURGICAL LIGHT HANDLE (MISCELLANEOUS) ×2 IMPLANT
DRAPE DERMATAC (DRAPES) IMPLANT
DRAPE INCISE IOBAN 66X45 STRL (DRAPES) IMPLANT
DRAPE U-SHAPE 47X51 STRL (DRAPES) ×1 IMPLANT
DRESSING PREVENA PLUS CUSTOM (GAUZE/BANDAGES/DRESSINGS) IMPLANT
DRESSING VERAFLO CLEANS CC MED (GAUZE/BANDAGES/DRESSINGS) IMPLANT
DRSG ADAPTIC 3X8 NADH LF (GAUZE/BANDAGES/DRESSINGS) ×1 IMPLANT
DURAPREP 26ML APPLICATOR (WOUND CARE) ×1 IMPLANT
ELECTRODE REM PT RTRN 9FT ADLT (ELECTROSURGICAL) IMPLANT
GAUZE PAD ABD 8X10 STRL (GAUZE/BANDAGES/DRESSINGS) IMPLANT
GAUZE SPONGE 4X4 12PLY STRL (GAUZE/BANDAGES/DRESSINGS) IMPLANT
GLOVE BIOGEL PI IND STRL 9 (GLOVE) ×1 IMPLANT
GLOVE SURG ORTHO 9.0 STRL STRW (GLOVE) ×1 IMPLANT
GOWN STRL REUS W/ TWL XL LVL3 (GOWN DISPOSABLE) ×2 IMPLANT
GRAFT SKIN WND MICRO 38 (Tissue) IMPLANT
GRAFT SKIN WND SURGIBIND 7X20 (Tissue) IMPLANT
KIT BASIN OR (CUSTOM PROCEDURE TRAY) ×1 IMPLANT
KIT TURNOVER KIT B (KITS) ×1 IMPLANT
MANIFOLD NEPTUNE II (INSTRUMENTS) ×1 IMPLANT
NS IRRIG 1000ML POUR BTL (IV SOLUTION) ×1 IMPLANT
PACK ORTHO EXTREMITY (CUSTOM PROCEDURE TRAY) ×1 IMPLANT
PAD ARMBOARD POSITIONER FOAM (MISCELLANEOUS) ×2 IMPLANT
PAD NEG PRESSURE SENSATRAC (MISCELLANEOUS) IMPLANT
SET HNDPC FAN SPRY TIP SCT (DISPOSABLE) IMPLANT
STOCKINETTE IMPERVIOUS 9X36 MD (GAUZE/BANDAGES/DRESSINGS) IMPLANT
SUT ETHILON 2 0 PSLX (SUTURE) ×1 IMPLANT
SWAB COLLECTION DEVICE MRSA (MISCELLANEOUS) ×1 IMPLANT
SWAB CULTURE ESWAB REG 1ML (MISCELLANEOUS) IMPLANT
TOWEL GREEN STERILE (TOWEL DISPOSABLE) ×1 IMPLANT
TUBE CONNECTING 12X1/4 (SUCTIONS) ×1 IMPLANT
YANKAUER SUCT BULB TIP NO VENT (SUCTIONS) ×1 IMPLANT

## 2024-03-10 NOTE — Progress Notes (Signed)
   03/10/24 2200  BiPAP/CPAP/SIPAP  Reason BIPAP/CPAP not in use Non-compliant   Patient refused for the evening, will have daughter bring in home machine tomorrow.

## 2024-03-10 NOTE — Transfer of Care (Signed)
 Immediate Anesthesia Transfer of Care Note  Patient: Joshua Conner  Procedure(s) Performed: LEFT CALF DEBRIDEMENT (Left: Leg Lower) APPLICATION, WOUND VAC (Left: Leg Lower)  Patient Location: PACU  Anesthesia Type:General  Level of Consciousness: awake and alert   Airway & Oxygen Therapy: Patient Spontanous Breathing and Patient connected to face mask oxygen  Post-op Assessment: Report given to RN and Post -op Vital signs reviewed and stable  Post vital signs: Reviewed and stable  Last Vitals:  Vitals Value Taken Time  BP 133/93 03/10/24 1148  Temp    Pulse 108 03/10/24 1150  Resp 21 03/10/24 1150  SpO2 90 % 03/10/24 1150  Vitals shown include unfiled device data.  Last Pain:  Vitals:   03/10/24 1032  TempSrc:   PainSc: 0-No pain      Patients Stated Pain Goal: 0 (03/10/24 1030)  Complications: No notable events documented.

## 2024-03-10 NOTE — Anesthesia Procedure Notes (Signed)
 Procedure Name: LMA Insertion Date/Time: 03/10/2024 11:05 AM  Performed by: Johann Muta, CRNAPre-anesthesia Checklist: Patient identified, Emergency Drugs available, Suction available and Patient being monitored Patient Re-evaluated:Patient Re-evaluated prior to induction Oxygen Delivery Method: Circle System Utilized Preoxygenation: Pre-oxygenation with 100% oxygen Induction Type: IV induction Ventilation: Mask ventilation without difficulty LMA: LMA inserted LMA Size: 5.0 Number of attempts: 1 Airway Equipment and Method: Bite block Placement Confirmation: positive ETCO2 Tube secured with: Tape Dental Injury: Teeth and Oropharynx as per pre-operative assessment

## 2024-03-10 NOTE — Op Note (Signed)
 03/10/2024  12:08 PM  PATIENT:  Joshua Conner    PRE-OPERATIVE DIAGNOSIS:  Abscess Left Leg  POST-OPERATIVE DIAGNOSIS:  Same  PROCEDURE:  LEFT CALF EXCISIONAL DEBRIDEMENT, with excision skin soft tissue muscle bone and fascia.   APPLICATION WOUND VAC cleanse choice and customizable. Application of Kerecis micro graft 38 cm and 7 x 10 cm sheet   SURGEON:  Timothy Ford, MD  PHYSICIAN ASSISTANT:None ANESTHESIA:   General  PREOPERATIVE INDICATIONS:  Joshua Conner is a  53 y.o. male with a diagnosis of Abscess Left Leg who failed conservative measures and elected for surgical management.    The risks benefits and alternatives were discussed with the patient preoperatively including but not limited to the risks of infection, bleeding, nerve injury, cardiopulmonary complications, the need for revision surgery, among others, and the patient was willing to proceed.  OPERATIVE IMPLANTS:   Implant Name Type Inv. Item Serial No. Manufacturer Lot No. LRB No. Used Action  GRAFT SKIN WND MICRO 38 - [id number] Tissue GRAFT SKIN WND MICRO 38  [employer] [id number] Left 1 Implanted  GRAFT SKIN WND SURGIBIND 7X20 - [id number] Tissue GRAFT SKIN WND SURGIBIND 7X20  [employer] [id number] Left 1 Implanted    @ENCIMAGES @  OPERATIVE FINDINGS: Patient had a large extensive area of exposed tibia.  Necrotic tissue sent for cultures.  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  OPERATIVE PROCEDURE: Patient was brought to the operating room and underwent a general anesthetic.  After adequate levels anesthesia obtained patient's left lower extremity was prepped using DuraPrep draped into a sterile field a timeout was called.  Elliptical incision was made around the ulcerative tissue and this left a wound that was 17 x 10 cm.  There is necrotic tissue adjacent to the tibia this was resected and there was exposed tibia for the length of the wound.  Nonviable  soft tissue was excised including muscle fascia and bone.  Electrocautery was used hemostasis.  The soft tissue was sent for cultures.  The wound was irrigated with Vashe x 2 both poured and with pulse lavage.  Local tissue transfer was used to partially close the wound 10 x 17 cm.  The wound surface area was covered with 38 cm of Kerecis micro graft and the tibial crest was covered with a 7 x 10 cm sheet.  The soft tissue was closed over these Kerecis graft.  The exposed soft tissue was covered with a cleanse choice wound VAC sponge and the entire surface area was covered with a customizable wound VAC secured with derma tack this had a good suction fit patient's leg was overwrapped with Coban.  Patient was extubated taken the PACU in stable condition.   DISCHARGE PLANNING:  Antibiotic duration: Patient will need long-term IV antibiotics to cover the osteomyelitis  Weightbearing: Weightbearing as tolerated  Pain medication: Opioid pathway  Dressing care/ Wound VAC: Continue wound VAC anticipate return to the operating room on Wednesday  Ambulatory devices: Walker or crutches  Discharge to: Home.  Follow-up: In the office 1 week post operative.

## 2024-03-10 NOTE — Consult Note (Signed)
 ORTHOPAEDIC CONSULTATION  REQUESTING PHYSICIAN: Barbee Lew, MD  Chief Complaint: Ulceration left calf.  HPI: Joshua Conner is a 53 y.o. male who presents with chronic ulceration left calf.  Past Medical History:  Diagnosis Date   Acid reflux    Diabetes mellitus without complication (HCC)    Seizures (HCC)    Varicose veins    Past Surgical History:  Procedure Laterality Date   HERNIA REPAIR     INGUINAL HERNIA REPAIR Left 02/04/2015   Procedure: LAPAROSCOPIC LEFT  INGUINAL HERNIA REPAIR WITH MESH;  Surgeon: Shela Derby, MD;  Location: [facility];  Service: General;  Laterality: Left;   INSERTION OF MESH Left 02/04/2015   Procedure: INSERTION OF MESH;  Surgeon: Shela Derby, MD;  Location: [facility];  Service: General;  Laterality: Left;   KNEE SURGERY     VARICOSE VEIN SURGERY     Social History   Socioeconomic History   Marital status: Married    Spouse name: Not on file   Number of children: Not on file   Years of education: Not on file   Highest education level: Not on file  Occupational History   Not on file  Tobacco Use   Smoking status: Never   Smokeless tobacco: Not on file  Substance and Sexual Activity   Alcohol use: Yes    Alcohol/week: 12.0 standard drinks of alcohol    Types: 12 Cans of beer per week    Comment: approx 6 beers a week   Drug use: No   Sexual activity: Not on file  Other Topics Concern   Not on file  Social History Narrative   Not on file   Social Drivers of Health   Financial Resource Strain: Not on file  Food Insecurity: No Food Insecurity (03/08/2024)   Hunger Vital Sign    Worried About Running Out of Food in the Last Year: Never true    Ran Out of Food in the Last Year: Never true  Transportation Needs: No Transportation Needs (03/08/2024)   PRAPARE - Administrator, Civil Service (Medical): No    Lack of Transportation (Non-Medical): No  Physical Activity: Not on file  Stress: Not on file   Social Connections: Socially Integrated (03/08/2024)   Social Connection and Isolation Panel [NHANES]    Frequency of Communication with Friends and Family: More than three times a week    Frequency of Social Gatherings with Friends and Family: More than three times a week    Attends Religious Services: More than 4 times per year    Active Member of [employer] or Organizations: No    Attends Engineer, structural: More than 4 times per year    Marital Status: Married   Family History  Problem Relation Age of Onset   Hypertension Mother    COPD Mother    Cancer Mother 47       breast   Hypertension Father    Hypertension Sister    - negative except otherwise stated in the family history section No Known Allergies Prior to Admission medications   Medication Sig Start Date End Date Taking? Authorizing Provider  amoxicillin -clavulanate (AUGMENTIN ) 875-125 MG tablet Take 1 tablet by mouth 2 (two) times daily. 02/28/24  Yes [provider]  doxycycline  (VIBRA -TABS) 100 MG tablet Take 100 mg by mouth 2 (two) times daily. 02/28/24  Yes [provider]  JARDIANCE 25 MG TABS tablet Take 25 mg by mouth in the morning.  01/26/24  Yes [provider]  LamoTRIgine  250 MG TB24 24 hour tablet Take 250 mg by mouth in the morning. 01/26/24  Yes [provider]  pantoprazole  (PROTONIX ) 40 MG tablet Take 40 mg by mouth daily before breakfast.   Yes [provider]  rosuvastatin  (CRESTOR ) 10 MG tablet Take 10 mg by mouth in the morning. 01/26/24  Yes [provider]  Soft Lens Products (CLEAR EYES CONTACT LENS RELIEF) SOLN Place 1 drop into both eyes as needed (for dry eyes). For rewetting contact lenses.   Yes [provider]  TYLENOL  500 MG tablet Take 1,000 mg by mouth every 6 (six) hours as needed (for pain).   Yes [provider]   MR TIBIA FIBULA LEFT W WO CONTRAST Result Date: 03/09/2024 CLINICAL DATA:  Left shin wound with  cellulitis.  Pain. EXAM: MRI OF LOWER LEFT EXTREMITY WITHOUT AND WITH CONTRAST TECHNIQUE: Multiplanar, multisequence MR imaging of the left tibia and fibula was performed both before and after administration of intravenous contrast. CONTRAST:  10mL GADAVIST GADOBUTROL 1 MMOL/ML IV SOLN COMPARISON:  Left tibia and fibula radiographs dated 03/07/2024. CT of the left tibia and fibula dated 02/15/2024. FINDINGS: Bones/Joint/Cartilage Periosteal edema and enhancement extending along the mid to distal anterior tibial diaphysis, most compatible with periostitis. No discrete marrow signal abnormality identified. No fracture or dislocation. Normal alignment. No visualized joint effusion. Ligaments Collateral ligaments appear grossly intact. Muscles and Tendons Flexor, peroneal and extensor compartment tendons are intact. Visualized patellar tendon is intact. Muscles are normal. No intramuscular collection or abnormal enhancement Soft tissue There is a large ulcerated soft tissue wound anteromedially extending along the mid lower leg, which tracks deep through the subcutaneous fat measuring approximately 5.2 x 1.2 cm in axial dimension and 9.8 cm in craniocaudal dimension. There is surrounding inflammatory change with soft tissue edema, enhancement, and cutaneous thickening, compatible with cellulitis. A 3.7 x 0.8 cm lobular septated rim enhancing region of fluid signal, concerning for abscess, within the anteromedial subcutaneous soft tissues just distal to the inferior margin of the large ulcerated wound described above (series 11, image 53 and series 18, image 51). Diffuse circumferential subcutaneous edema extending through the mid to distal lower leg. IMPRESSION: 1. Large ulcerated soft tissue wound extending along the left anteromedial mid to distal lower leg with surrounding cellulitis. A 3.7 x 0.8 cm lobular septated region of rim enhancing fluid within the anteromedial subcutaneous soft tissues just distal to the  inferior margin of the large ulcerated wound described above is concerning for abscess. 2. Findings most compatible with periostitis extending along the mid to distal anterior tibial diaphysis, presumably reactive to the inflammatory changes described above. Electronically Signed   By: Mannie Seek M.D.   On: 03/09/2024 13:44   - pertinent xrays, CT, MRI studies were reviewed and independently interpreted  Positive ROS: All other systems have been reviewed and were otherwise negative with the exception of those mentioned in the HPI and as above.  Physical Exam: General: Alert, no acute distress Psychiatric: Patient is competent for consent with normal mood and affect Lymphatic: No axillary or cervical lymphadenopathy Cardiovascular: No pedal edema Respiratory: No cyanosis, no use of accessory musculature GI: No organomegaly, abdomen is soft and non-tender    Images:  @ENCIMAGES @  Labs:  Lab Results  Component Value Date   HGBA1C 7.0 (H) 03/07/2024   HGBA1C 6.3 (H) 02/15/2024   CRP 8.5 (H) 02/16/2024   CRP 9.8 (H) 02/15/2024   REPTSTATUS PENDING  03/07/2024   CULT  03/07/2024    NO GROWTH 1 DAY Performed at [facility], [address] [place], [place] [address]     Lab Results  Component Value Date   ALBUMIN 3.3 (L) 03/08/2024   ALBUMIN 4.0 03/07/2024   ALBUMIN 3.9 02/14/2024        Latest Ref Rng & Units 03/09/2024    5:53 AM 03/08/2024    5:54 AM 03/07/2024   10:24 PM  CBC EXTENDED  WBC 4.0 - 10.5 K/uL 7.9  9.5  14.1   RBC 4.22 - 5.81 MIL/uL 4.46  3.95  4.74   Hemoglobin 13.0 - 17.0 g/dL 60.4  54.0  98.1   HCT 39.0 - 52.0 % 41.2  36.8  43.3   Platelets 150 - 400 K/uL 220  207  271   NEUT# 1.7 - 7.7 K/uL 5.6   11.6   Lymph# 0.7 - 4.0 K/uL 1.6   1.6     Neurologic: Patient does not have protective sensation bilateral lower extremities.   MUSCULOSKELETAL:   Skin: Examination patient has a deep chronic ulcer to the left calf.  Review of the MRI scan  does show some edema in the bone possible consistent with infection versus inflammation.  Patient does complain of bone pain.  Patient has a strong palpable dorsalis pedis pulse  Hemoglobin 13.3 with a white cell count of 7.9.  Hemoglobin A1c 7.0.  Assessment: Assessment: Large ulceration left calf.  Plan: Will plan for surgical debridement today.  Will send tissue for deep cultures.  Plan for negative pressure therapy and return to the operating room on Wednesday for repeat debridement.  Thank you for the consult and the opportunity to see Mr. Joshua Mcguffey, MD [facility] [phone] 8:37 AM

## 2024-03-10 NOTE — Progress Notes (Addendum)
 [facility]    Date of Admission:  03/07/2024     ID: Joshua Conner is a 53 y.o. 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 OR cultures pending  Wbc at 8 down from 14 ( 2 days ago) Medications:   acetaminophen   1,000 mg Oral Q8H   docusate sodium   100 mg Oral BID   empagliflozin  25 mg Oral Daily   enoxaparin (LOVENOX) injection  70 mg Subcutaneous Daily   insulin  aspart  0-9 Units Subcutaneous Q4H   lamoTRIgine   250 mg Oral Daily   levofloxacin  750 mg Oral Daily   linezolid   600 mg Oral Q12H   pantoprazole   40 mg Oral QAC breakfast   rosuvastatin   10 mg Oral Daily   sodium chloride  flush  3-10 mL Intravenous Q12H    Objective: Vital signs in last 24 hours: Temp:  [97 F (36.1 C)-98 F (36.7 C)] 98 F (36.7 C) (05/16 1333) Pulse Rate:  [71-106] 98 (05/16 1333) Resp:  [10-21] 16 (05/16 1300) BP: (110-138)/(66-97) 122/80 (05/16 1333) SpO2:  [92 %-100 %] 94 % (05/16 1333) FiO2 (%):  [21 %] 21 % (05/15 2205) Weight:  [137.1 kg] 137.1 kg (05/16 0955)  Physical Exam  Constitutional: He is oriented to person, place, and time. He appears well-developed and well-nourished. No distress.  HENT:  Mouth/Throat: Oropharynx is clear and moist. No oropharyngeal exudate.  Cardiovascular: Normal rate, regular rhythm and normal heart sounds. Exam reveals no gallop  and no friction rub.  No murmur heard.  Pulmonary/Chest: Effort normal and breath sounds normal. No respiratory distress. He has no wheezes.  Neurological: He is alert and oriented to person, place, and time.  Skin: Skin is warm and dry. No rash noted. No erythema. Left calf is wrapped with wound vac in place Psychiatric: He has a normal mood and affect. His behavior is normal.    Lab Results Recent Labs    03/08/24 0554 03/09/24 0553  WBC 9.5 7.9  HGB 11.7* 13.3  HCT 36.8* 41.2  NA 140 136  K 3.9 4.3  CL 109 103  CO2 24 24  BUN 18 14  CREATININE 1.12 1.19   Liver Panel Recent Labs    03/07/24 2224 03/08/24 0554  PROT 7.1 5.8*  ALBUMIN 4.0 3.3*  AST 15 11*  ALT 30 21  ALKPHOS 67 50  BILITOT 0.9 0.7    Microbiology: Blood cx on 5/13 ngtd Or cx from 5/16 pending Studies/Results: MR TIBIA FIBULA LEFT W WO CONTRAST Result Date: 03/09/2024 CLINICAL DATA:  Left shin wound with cellulitis.  Pain. EXAM: MRI OF LOWER LEFT EXTREMITY WITHOUT AND WITH CONTRAST TECHNIQUE: Multiplanar, multisequence MR imaging of the left tibia and fibula was performed both before and after administration of intravenous contrast. CONTRAST:  10mL GADAVIST GADOBUTROL 1  MMOL/ML IV SOLN COMPARISON:  Left tibia and fibula radiographs dated 03/07/2024. CT of the left tibia and fibula dated 02/15/2024. FINDINGS: Bones/Joint/Cartilage Periosteal edema and enhancement extending along the mid to distal anterior tibial diaphysis, most compatible with periostitis. No discrete marrow signal abnormality identified. No fracture or dislocation. Normal alignment. No visualized joint effusion. Ligaments Collateral ligaments appear grossly intact. Muscles and Tendons Flexor, peroneal and extensor compartment tendons are intact. Visualized patellar tendon is intact. Muscles are normal. No intramuscular collection or abnormal enhancement Soft tissue There is a large ulcerated soft tissue wound anteromedially extending along  the mid lower leg, which tracks deep through the subcutaneous fat measuring approximately 5.2 x 1.2 cm in axial dimension and 9.8 cm in craniocaudal dimension. There is surrounding inflammatory change with soft tissue edema, enhancement, and cutaneous thickening, compatible with cellulitis. A 3.7 x 0.8 cm lobular septated rim enhancing region of fluid signal, concerning for abscess, within the anteromedial subcutaneous soft tissues just distal to the inferior margin of the large ulcerated wound described above (series 11, image 53 and series 18, image 51). Diffuse circumferential subcutaneous edema extending through the mid to distal lower leg. IMPRESSION: 1. Large ulcerated soft tissue wound extending along the left anteromedial mid to distal lower leg with surrounding cellulitis. A 3.7 x 0.8 cm lobular septated region of rim enhancing fluid within the anteromedial subcutaneous soft tissues just distal to the inferior margin of the large ulcerated wound described above is concerning for abscess. 2. Findings most compatible with periostitis extending along the mid to distal anterior tibial diaphysis, presumably reactive to the inflammatory changes described above. Electronically Signed   By: Mannie Seek M.D.   On: 03/09/2024 13:44     Assessment/Plan: 53yo m with large left calf ulcer with cellulitis and abscess in subcutaneous tissue per mri s/p I x D and wound vac placement on 5/15 =  Recommend to change oral abtx to vancomycin  and cefepime  We will follow cultures over the weekend to adjust abtx regimen  Therapeutic drug monitoring = will follow cr and vanco level to dose adjust  Leukocytosis = improving. And will continue to follow  Dr Francee Inch available for questions over the weekend but we will see back in person on monday  [facility] Pager: [phone]  03/10/2024, 3:15 PM

## 2024-03-10 NOTE — Progress Notes (Signed)
 PROGRESS NOTE    Joshua Conner  QMV:[id number] DOB: 1971/06/14 DOA: 03/07/2024 PCP: Jimmey Mould, MD   Brief Narrative:  53 y.o. male with PMH significant for DM2, HTN, HLD, morbid obesity, OSA on CPAP, seizure disorder, GERD, meningioma, varicose veins s/p ablation. On 02/03/24, while at work, piece of quartz fell on his left leg and sustained traumatic laceration.  The wound got infected eventually.  Since then, patient has had multiple courses of antibiotics, evaluation of the wound care center and also hospitalization for IV antibiotics in April. 5/12, 1 day prior to presentation, seen at wound care center. Underwent debridement. 5/13, patient noticed increasing redness around the wound and purulent drainage from the wound itself.  Later in the day, he had a scheduled appointment with ID but prior to their appointment, patient presented to the ED with foul-smelling drainage, subjective fever, nausea, vomiting and a concern of recurrent infection of chronic wound.    In the ED, patient was afebrile but tachycardic to 130s, blood pressure maintained Labs with WBC of 14.1, glucose 218, creatinine 1.3, lactic acid normal at 1.4 Left tibia/fibula x-ray negative for any osteomyelitis Blood culture was sent Patient was started on broad-spectrum IV antibiotic coverage with IV cefepime, IV Zosyn, IV vancomycin  Admitted to [facility] and wound care consulted  Assessment & Plan:   Principal Problem:   Sepsis (HCC) Active Problems:   Cellulitis of left lower extremity   Controlled type 2 diabetes mellitus without complication, without long-term current use of insulin  (HCC)   Hyperlipidemia   Cellulitis   History of seizure disorder   CKD stage 3a, GFR 45-59 ml/min (HCC)   Obesity, Class III, BMI 40-49.9 (morbid obesity)   OSA (obstructive sleep apnea)   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 Julio Ohm 03/10/2024.  Patient failed outpatient debridement as well as multiple courses of inpatient and outpatient antibiotics.  Met criteria for sepsis on admission with leukocytosis tachycardia lactic acidosis.  He was afebrile on admission.  X-ray of the left tibia-fibula at the time of admission was negative for osteomyelitis.  Since patient continued to complain of pain a MRI of the left lower extremity was done which was concerning for abscess.  He was seen by Dr. Lucienne Ryder who transferred patient to [facility] to be seen by Dr. Julio Ohm and further debridement.  He was initially on Levaquin and Zyvox  and this is being switched to cefepime and vancomycin  on May 16.  ID following.   Type 2 diabetes mellitus A1c 6.3 from April 2025 PTA meds-Jardiance 25 mg daily. Currently on SSI/Accu-Cheks.  Resume Jardiance Last Labs         Recent Labs  Lab 03/08/24 2028 03/08/24 2307 03/09/24 0459 03/09/24 0716 03/09/24 1228  GLUCAP 128* 119* 115* 120* 123*      HLD Crestor    H/o seizure disorder Continue Lamictal    Morbid Obesity  Body mass index is 40.99 kg/m. Patient has been advised to make an attempt to improve diet and exercise patterns to aid in weight loss.   OSA On CPAP   GERD PPI   H/o varicose vein s/p ablation  several years ago on both legs    Estimated body mass index is 40.99 kg/m as calculated from the following:   Height as of this encounter: 6' (1.829 m).   Weight as of this encounter: 137.1 kg.  DVT  prophylaxis: LOVENOX Code Status:full Family Communication: none Disposition Plan:  Status is: Inpatient Remains inpatient appropriate because: acute illness   Consultants:  ID DR DUDA  Procedures: EXCISIONAL DEBRIDEMENT LLE 5/16 Antimicrobials: VANC CEFEPIME  Subjective: Seen prior to surgery is sitting by the side of the bed left lower extremity covered with dressing moving around in the room without any distress  Objective: Vitals:    03/10/24 1230 03/10/24 1245 03/10/24 1300 03/10/24 1333  BP: 120/66 128/79 138/89 122/80  Pulse: (!) 103 100 (!) 102 98  Resp: 16 10 16    Temp:   (!) 97.5 F (36.4 C) 98 F (36.7 C)  TempSrc:    Oral  SpO2: 93% 93% 92% 94%  Weight:      Height:        Intake/Output Summary (Last 24 hours) at 03/10/2024 1516 Last data filed at 03/10/2024 1147 Gross per 24 hour  Intake 650 ml  Output 100 ml  Net 550 ml   Filed Weights   03/08/24 0006 03/10/24 0955  Weight: (!) 137.1 kg (!) 137.1 kg    Examination:  General exam: Appears in no acute distress  respiratory system: Clear to auscultation. Respiratory effort normal. Cardiovascular system: S1 & S2 heard, RRR. No JVD, murmurs, rubs, gallops or clicks. No pedal edema. Gastrointestinal system: Abdomen is nondistended, soft and nontender. No organomegaly or masses felt. Normal bowel sounds heard. Central nervous system: Alert and oriented. No focal neurological deficits. Extremities: Left lower extremity covered with a bandage   Data Reviewed: I have personally reviewed following labs and imaging studies  CBC: Recent Labs  Lab 03/07/24 2224 03/08/24 0554 03/09/24 0553  WBC 14.1* 9.5 7.9  NEUTROABS 11.6*  --  5.6  HGB 14.1 11.7* 13.3  HCT 43.3 36.8* 41.2  MCV 91.4 93.2 92.4  PLT 271 207 220   Basic Metabolic Panel: Recent Labs  Lab 03/07/24 2224 03/08/24 0554 03/09/24 0553  NA 140 140 136  K 3.6 3.9 4.3  CL 107 109 103  CO2 21* 24 24  GLUCOSE 218* 131* 106*  BUN 16 18 14   CREATININE 1.30* 1.12 1.19  CALCIUM  9.0 8.3* 9.1  MG  --  2.0  --   PHOS  --  4.0  --    GFR: Estimated Creatinine Clearance: 104.1 mL/min (by C-G formula based on SCr of 1.19 mg/dL). Liver Function Tests: Recent Labs  Lab 03/07/24 2224 03/08/24 0554  AST 15 11*  ALT 30 21  ALKPHOS 67 50  BILITOT 0.9 0.7  PROT 7.1 5.8*  ALBUMIN 4.0 3.3*   No results for input(s): "LIPASE", "AMYLASE" in the last 168 hours. No results for input(s):  "AMMONIA" in the last 168 hours. Coagulation Profile: Recent Labs  Lab 03/07/24 2234  INR 1.0   Cardiac Enzymes: No results for input(s): "CKTOTAL", "CKMB", "CKMBINDEX", "TROPONINI" in the last 168 hours. BNP (last 3 results) No results for input(s): "PROBNP" in the last 8760 hours. HbA1C: Recent Labs    03/07/24 2224  HGBA1C 7.0*   CBG: Recent Labs  Lab 03/10/24 0051 03/10/24 0519 03/10/24 0820 03/10/24 0958 03/10/24 1150  GLUCAP 151* 127* 129* 129* 133*   Lipid Profile: No results for input(s): "CHOL", "HDL", "LDLCALC", "TRIG", "CHOLHDL", "LDLDIRECT" in the last 72 hours. Thyroid Function Tests: No results for input(s): "TSH", "T4TOTAL", "FREET4", "T3FREE", "THYROIDAB" in the last 72 hours. Anemia Panel: No results for input(s): "VITAMINB12", "FOLATE", "FERRITIN", "TIBC", "IRON", "RETICCTPCT" in the last 72 hours. Sepsis Labs: Recent Labs  Lab  03/07/24 2233  LATICACIDVEN 1.4    Recent Results (from the past 240 hours)  Culture, blood (Routine x 2)     Status: None (Preliminary result)   Collection Time: 03/07/24 10:24 PM   Specimen: BLOOD  Result Value Ref Range Status   Specimen Description   Final    BLOOD SITE NOT SPECIFIED Performed at [facility], [address] [place], [place] [address]    Special Requests   Final    BOTTLES DRAWN AEROBIC AND ANAEROBIC Blood Culture adequate volume Performed at [facility], [address], [place], [place] [address]    Culture   Final    NO GROWTH 2 DAYS Performed at [facility], [address], [place], [place] [address]    Report Status PENDING  Incomplete  Culture, blood (Routine x 2)     Status: None (Preliminary result)   Collection Time: 03/07/24 10:29 PM   Specimen: BLOOD LEFT HAND  Result Value Ref Range Status   Specimen Description   Final    BLOOD LEFT HAND Performed at [facility], [address], [place], [place] [address]     Special Requests   Final    BOTTLES DRAWN AEROBIC AND ANAEROBIC Blood Culture adequate volume Performed at [facility], [address], [place], [place] [address]    Culture   Final    NO GROWTH 2 DAYS Performed at [facility], [address], [place], [place] [address]    Report Status PENDING  Incomplete  MRSA Next Gen by PCR, Nasal     Status: None   Collection Time: 03/08/24  8:54 PM   Specimen: Nasal Mucosa; Nasal Swab  Result Value Ref Range Status   MRSA by PCR Next Gen NOT DETECTED NOT DETECTED Final    Comment: (NOTE) The GeneXpert MRSA Assay (FDA approved for NASAL specimens only), is one component of a comprehensive MRSA colonization surveillance program. It is not intended to diagnose MRSA infection nor to guide or monitor treatment for MRSA infections. Test performance is not FDA approved in patients less than 33 years old. Performed at [facility], [address] [place], [place] [address]   MRSA Next Gen by PCR, Nasal     Status: None   Collection Time: 03/10/24  8:14 AM   Specimen: Nasal Mucosa; Nasal Swab  Result Value Ref Range Status   MRSA by PCR Next Gen NOT DETECTED NOT DETECTED Final    Comment: (NOTE) The GeneXpert MRSA Assay (FDA approved for NASAL specimens only), is one component of a comprehensive MRSA colonization surveillance program. It is not intended to diagnose MRSA infection nor to guide or monitor treatment for MRSA infections. Test performance is not FDA approved in patients less than 33 years old. Performed at [facility], [address], [place], [place] [address]          Radiology Studies: MR TIBIA FIBULA LEFT W WO CONTRAST Result Date: 03/09/2024 CLINICAL DATA:  Left shin wound with cellulitis.  Pain. EXAM: MRI OF LOWER LEFT EXTREMITY WITHOUT AND WITH CONTRAST TECHNIQUE: Multiplanar, multisequence MR imaging of the left tibia and fibula was performed both before and after  administration of intravenous contrast. CONTRAST:  10mL GADAVIST GADOBUTROL 1 MMOL/ML IV SOLN COMPARISON:  Left tibia and fibula radiographs dated 03/07/2024. CT of the left tibia and fibula dated 02/15/2024. FINDINGS: Bones/Joint/Cartilage Periosteal edema and enhancement extending along the mid to distal  anterior tibial diaphysis, most compatible with periostitis. No discrete marrow signal abnormality identified. No fracture or dislocation. Normal alignment. No visualized joint effusion. Ligaments Collateral ligaments appear grossly intact. Muscles and Tendons Flexor, peroneal and extensor compartment tendons are intact. Visualized patellar tendon is intact. Muscles are normal. No intramuscular collection or abnormal enhancement Soft tissue There is a large ulcerated soft tissue wound anteromedially extending along the mid lower leg, which tracks deep through the subcutaneous fat measuring approximately 5.2 x 1.2 cm in axial dimension and 9.8 cm in craniocaudal dimension. There is surrounding inflammatory change with soft tissue edema, enhancement, and cutaneous thickening, compatible with cellulitis. A 3.7 x 0.8 cm lobular septated rim enhancing region of fluid signal, concerning for abscess, within the anteromedial subcutaneous soft tissues just distal to the inferior margin of the large ulcerated wound described above (series 11, image 53 and series 18, image 51). Diffuse circumferential subcutaneous edema extending through the mid to distal lower leg. IMPRESSION: 1. Large ulcerated soft tissue wound extending along the left anteromedial mid to distal lower leg with surrounding cellulitis. A 3.7 x 0.8 cm lobular septated region of rim enhancing fluid within the anteromedial subcutaneous soft tissues just distal to the inferior margin of the large ulcerated wound described above is concerning for abscess. 2. Findings most compatible with periostitis extending along the mid to distal anterior tibial diaphysis,  presumably reactive to the inflammatory changes described above. Electronically Signed   By: Mannie Seek M.D.   On: 03/09/2024 13:44    Scheduled Meds:  acetaminophen   1,000 mg Oral Q8H   docusate sodium   100 mg Oral BID   empagliflozin  25 mg Oral Daily   enoxaparin (LOVENOX) injection  70 mg Subcutaneous Daily   insulin  aspart  0-9 Units Subcutaneous Q4H   lamoTRIgine   250 mg Oral Daily   levofloxacin  750 mg Oral Daily   linezolid   600 mg Oral Q12H   pantoprazole   40 mg Oral QAC breakfast   rosuvastatin   10 mg Oral Daily   sodium chloride  flush  3-10 mL Intravenous Q12H   Continuous Infusions:  ceFEPime (MAXIPIME) IV     [START ON 03/11/2024] vancomycin      vancomycin        LOS: 3 days    Time spent: 49 min  Barbee Lew, MD  03/10/2024, 3:16 PM

## 2024-03-10 NOTE — Progress Notes (Signed)
 Pharmacy Antibiotic Note  Joshua Conner is a 53 y.o. male admitted on 03/07/2024 with a wound infection on his left calf.  Pharmacy has been consulted for vancomycin  and cefepime dosing.  Plan: Vancomycin  2.5g IV x 1 load, then 1250mg  IV q12h Cefepime 2g IV q8h Monitor cultures and renal function  Height: 6' (182.9 cm) Weight: (!) 137.1 kg (302 lb 4 oz) IBW/kg (Calculated) : 77.6  Temp (24hrs), Avg:97.6 F (36.4 C), Min:97 F (36.1 C), Max:98 F (36.7 C)  Recent Labs  Lab 03/07/24 2224 03/07/24 2233 03/08/24 0554 03/09/24 0553  WBC 14.1*  --  9.5 7.9  CREATININE 1.30*  --  1.12 1.19  LATICACIDVEN  --  1.4  --   --     Estimated Creatinine Clearance: 104.1 mL/min (by C-G formula based on SCr of 1.19 mg/dL).    No Known Allergies  Antimicrobials this admission: Zosyn 5/13 x 1 Levofloxacin  5/14 >> 5/16 Linezolid  5/14>>5/16 Cefepime 1 x on 5/14;  5/16>>  Dose adjustments this admission: N/a  Microbiology results: 5/13 BCx: NG 5/16 tissue cultures from OR pending 5/16 MRSA PCR: Negative  Thank you for allowing pharmacy to be a part of this patient's care.  Sherre Docker 03/10/2024 3:08 PM

## 2024-03-11 DIAGNOSIS — L03116 Cellulitis of left lower limb: Secondary | ICD-10-CM | POA: Diagnosis not present

## 2024-03-11 LAB — BASIC METABOLIC PANEL WITH GFR
Anion gap: 8 (ref 5–15)
BUN: 25 mg/dL — ABNORMAL HIGH (ref 6–20)
CO2: 24 mmol/L (ref 22–32)
Calcium: 8.5 mg/dL — ABNORMAL LOW (ref 8.9–10.3)
Chloride: 101 mmol/L (ref 98–111)
Creatinine, Ser: 1.49 mg/dL — ABNORMAL HIGH (ref 0.61–1.24)
GFR, Estimated: 56 mL/min — ABNORMAL LOW (ref >60)
Glucose, Bld: 195 mg/dL — ABNORMAL HIGH (ref 70–99)
Potassium: 4.2 mmol/L (ref 3.5–5.1)
Sodium: 133 mmol/L — ABNORMAL LOW (ref 135–145)

## 2024-03-11 LAB — GLUCOSE, CAPILLARY
Glucose-Capillary: 175 mg/dL — ABNORMAL HIGH (ref 70–99)
Glucose-Capillary: 186 mg/dL — ABNORMAL HIGH (ref 70–99)
Glucose-Capillary: 236 mg/dL — ABNORMAL HIGH (ref 70–99)
Glucose-Capillary: 182 mg/dL — ABNORMAL HIGH (ref 70–99)
Glucose-Capillary: 184 mg/dL — ABNORMAL HIGH (ref 70–99)

## 2024-03-11 MED ORDER — SODIUM CHLORIDE 0.9 % IV SOLN
1.0000 g | Freq: Three times a day (TID) | INTRAVENOUS | Status: DC
  Administered 2024-03-11 – 2024-03-13 (×6): 1 g via INTRAVENOUS
  Filled 2024-03-11 (×6): qty 20

## 2024-03-11 MED ORDER — OXYCODONE HCL 5 MG PO TABS
10.0000 mg | ORAL_TABLET | ORAL | Status: DC | PRN
  Administered 2024-03-11 – 2024-03-16 (×20): 10 mg via ORAL
  Filled 2024-03-11 (×20): qty 2

## 2024-03-11 MED ORDER — SENNOSIDES-DOCUSATE SODIUM 8.6-50 MG PO TABS
2.0000 | ORAL_TABLET | Freq: Every day | ORAL | Status: DC
  Administered 2024-03-11 – 2024-03-15 (×4): 2 via ORAL
  Filled 2024-03-11 (×5): qty 2

## 2024-03-11 MED ORDER — POLYETHYLENE GLYCOL 3350 17 G PO PACK
17.0000 g | PACK | Freq: Two times a day (BID) | ORAL | Status: DC
  Administered 2024-03-12 – 2024-03-16 (×4): 17 g via ORAL
  Filled 2024-03-11 (×7): qty 1

## 2024-03-11 NOTE — Progress Notes (Signed)
 Patient ID: Joshua Conner, male   DOB: 06/08/1971, 53 y.o.   MRN: [id number] Patient is postoperative day 1 debridement chronic wound left anterior tibia.  There was exposed tibia for approximately 10 cm.  Patient has chronic osteomyelitis.  Cultures are showing gram-negative rods.  I will change his antibiotics to meropenem.  Plan for repeat surgical debridement on Wednesday.  Patient will need additional tissue coverage.  Patient will need discharge with a PICC line and IV antibiotics for at least 6 weeks.

## 2024-03-11 NOTE — Progress Notes (Signed)
 PROGRESS NOTE    Joshua Conner  ZOX:[id number] DOB: 1970/11/05 DOA: 03/07/2024 PCP: Jimmey Mould, MD   Brief Narrative:  53 y.o. male with PMH significant for DM2, HTN, HLD, morbid obesity, OSA on CPAP, seizure disorder, GERD, meningioma, varicose veins s/p ablation. On 02/03/24, while at work, piece of quartz fell on his left leg and sustained traumatic laceration.  The wound got infected eventually.  Since then, patient has had multiple courses of antibiotics, evaluation of the wound care center and also hospitalization for IV antibiotics in April. 5/12, 1 day prior to presentation, seen at wound care center. Underwent debridement. 5/13, patient noticed increasing redness around the wound and purulent drainage from the wound itself.  Later in the day, he had a scheduled appointment with ID but prior to their appointment, patient presented to the ED with foul-smelling drainage, subjective fever, nausea, vomiting and a concern of recurrent infection of chronic wound.    In the ED, patient was afebrile but tachycardic to 130s, blood pressure maintained Labs with WBC of 14.1, glucose 218, creatinine 1.3, lactic acid normal at 1.4 Left tibia/fibula x-ray negative for any osteomyelitis Blood culture was sent Patient was started on broad-spectrum IV antibiotic coverage with IV cefepime , IV Zosyn , IV vancomycin  Admitted to 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 Julio Ohm 03/10/2024.  Patient failed outpatient debridement as well as multiple courses of inpatient and outpatient antibiotics.  Met criteria for sepsis on admission with leukocytosis tachycardia lactic acidosis.  He was afebrile on admission.  X-ray of the left tibia-fibula at the time of admission was negative for osteomyelitis.  Since patient continued to complain of pain a MRI of the left lower extremity was done which was concerning for abscess.  He was seen by Dr. Lucienne Ryder who transferred patient to [facility] to be seen by Dr. Julio Ohm and further debridement.   He was initially on Levaquin  and Zyvox  and this is being switched to cefepime  and vancomycin  on May 16.  ID following. Status post left calf excisional debridement with excision skin soft tissue muscle bone and fascia.  Wound VAC in place.dr duda 5/16 Cultures GNR Meropenem started 5/17   Type 2 diabetes mellitus A1c 6.3 from April 2025 PTA meds-Jardiance  25 mg daily. Currently on SSI/Accu-Cheks.  Resume Jardiance  Last Labs         Recent Labs  Lab 03/08/24 2028 03/08/24 2307 03/09/24 0459 03/09/24 0716 03/09/24 1228  GLUCAP 128* 119* 115* 120* 123*      HLD Crestor    H/o seizure disorder Continue Lamictal    Morbid Obesity  Body mass index is 40.99 kg/m. Patient has been advised to make an attempt to improve diet and exercise patterns to aid in weight loss.   OSA On CPAP   GERD PPI   H/o varicose vein s/p ablation  several years ago on both legs    Estimated body mass index is  40.99 kg/m as calculated from the following:   Height as of this encounter: 6' (1.829 m).   Weight as of this encounter: 137.1 kg.  DVT prophylaxis: LOVENOX  Code Status:full Family Communication: none Disposition Plan:  Status is: Inpatient Remains inpatient appropriate because: acute illness   Consultants:  ID DR DUDA  Procedures: EXCISIONAL DEBRIDEMENT LLE 5/16 Antimicrobials: VANC  CEFEPIME   Subjective:  Resting in bed no events overnight Vac In place Cr trending up   Objective: Vitals:   03/10/24 1954 03/10/24 2352 03/11/24 0400 03/11/24 0807  BP: 115/80 107/83 120/69 112/74  Pulse: (!) 106 93 88 84  Resp: 16 18 18 18   Temp: 98.2 F (36.8 C) 98.1 F (36.7 C) 97.7 F (36.5 C) 97.9 F (36.6 C)  TempSrc: Oral Oral    SpO2: 91% 94% 94% 94%  Weight:      Height:        Intake/Output Summary (Last 24 hours) at 03/11/2024 1251 Last data filed at 03/11/2024 0815 Gross per 24 hour  Intake 445 ml  Output 2510 ml  Net -2065 ml   Filed Weights   03/08/24 0006 03/10/24 0955  Weight: (!) 137.1 kg (!) 137.1 kg    Examination:  General exam: Appears in no acute distress  respiratory system: Clear to auscultation. Respiratory effort normal. Cardiovascular system: S1 & S2 heard, RRR. No JVD, murmurs, rubs, gallops or clicks. No pedal edema. Gastrointestinal system: Abdomen is nondistended, soft and nontender. No organomegaly or masses felt. Normal bowel sounds heard. Central nervous system: Alert and oriented. No focal neurological deficits. Extremities: Left lower extremity covered with a bandage   Data Reviewed: I have personally reviewed following labs and imaging studies  CBC: Recent Labs  Lab 03/07/24 2224 03/08/24 0554 03/09/24 0553  WBC 14.1* 9.5 7.9  NEUTROABS 11.6*  --  5.6  HGB 14.1 11.7* 13.3  HCT 43.3 36.8* 41.2  MCV 91.4 93.2 92.4  PLT 271 207 220   Basic Metabolic Panel: Recent Labs  Lab 03/07/24 2224 03/08/24 0554 03/09/24 0553 03/11/24 0454  NA 140 140 136 133*  K 3.6 3.9 4.3 4.2  CL 107 109 103 101  CO2 21* 24 24 24   GLUCOSE 218* 131* 106* 195*  BUN 16 18 14  25*  CREATININE 1.30* 1.12 1.19 1.49*  CALCIUM  9.0 8.3* 9.1 8.5*  MG  --  2.0  --   --   PHOS  --  4.0  --   --    GFR: Estimated Creatinine Clearance: 83.2 mL/min (A) (by C-G formula based on SCr of 1.49 mg/dL (H)). Liver Function Tests: Recent Labs  Lab  03/07/24 2224 03/08/24 0554  AST 15 11*  ALT 30 21  ALKPHOS 67 50  BILITOT 0.9 0.7  PROT 7.1 5.8*  ALBUMIN 4.0 3.3*   No results for input(s): "LIPASE", "AMYLASE" in the last 168 hours. No results for input(s): "AMMONIA" in the last 168 hours. Coagulation Profile: Recent Labs  Lab 03/07/24 2234  INR 1.0   Cardiac Enzymes: No results for input(s): "CKTOTAL", "CKMB", "CKMBINDEX", "TROPONINI" in the last 168 hours. BNP (last 3 results) No results for input(s): "PROBNP" in the last 8760 hours. HbA1C: No results for input(s): "HGBA1C" in the last 72 hours.  CBG: Recent Labs  Lab 03/10/24 1955 03/10/24 2355 03/11/24 0319 03/11/24 0808 03/11/24 1150  GLUCAP 224* 203* 175* 186* 236*   Lipid Profile: No results for input(s): "CHOL", "HDL", "LDLCALC", "TRIG", "CHOLHDL", "LDLDIRECT" in the last 72 hours. Thyroid Function Tests:  No results for input(s): "TSH", "T4TOTAL", "FREET4", "T3FREE", "THYROIDAB" in the last 72 hours. Anemia Panel: No results for input(s): "VITAMINB12", "FOLATE", "FERRITIN", "TIBC", "IRON", "RETICCTPCT" in the last 72 hours. Sepsis Labs: Recent Labs  Lab 03/07/24 2233  LATICACIDVEN 1.4    Recent Results (from the past 240 hours)  Culture, blood (Routine x 2)     Status: None (Preliminary result)   Collection Time: 03/07/24 10:24 PM   Specimen: BLOOD  Result Value Ref Range Status   Specimen Description   Final    BLOOD SITE NOT SPECIFIED Performed at [facility], [address], [place], [place] [address]    Special Requests   Final    BOTTLES DRAWN AEROBIC AND ANAEROBIC Blood Culture adequate volume Performed at [facility], [address] [place], [place] [address]    Culture   Final    NO GROWTH 3 DAYS Performed at [facility], [address] [place], [place] [address]    Report Status PENDING  Incomplete  Culture, blood (Routine x 2)     Status: None (Preliminary result)   Collection  Time: 03/07/24 10:29 PM   Specimen: BLOOD LEFT HAND  Result Value Ref Range Status   Specimen Description   Final    BLOOD LEFT HAND Performed at [facility], [address], [place], [place] [address]    Special Requests   Final    BOTTLES DRAWN AEROBIC AND ANAEROBIC Blood Culture adequate volume Performed at [facility], [address] [place], [place] [address]    Culture   Final    NO GROWTH 3 DAYS Performed at [facility], [address], [place], [place] [address]    Report Status PENDING  Incomplete  MRSA Next Gen by PCR, Nasal     Status: None   Collection Time: 03/08/24  8:54 PM   Specimen: Nasal Mucosa; Nasal Swab  Result Value Ref Range Status   MRSA by PCR Next Gen NOT DETECTED NOT DETECTED Final    Comment: (NOTE) The GeneXpert MRSA Assay (FDA approved for NASAL specimens only), is one component of a comprehensive MRSA colonization surveillance program. It is not intended to diagnose MRSA infection nor to guide or monitor treatment for MRSA infections. Test performance is not FDA approved in patients less than 42 years old. Performed at [facility], [address] [place], [place] [address]   MRSA Next Gen by PCR, Nasal     Status: None   Collection Time: 03/10/24  8:14 AM   Specimen: Nasal Mucosa; Nasal Swab  Result Value Ref Range Status   MRSA by PCR Next Gen NOT DETECTED NOT DETECTED Final    Comment: (NOTE) The GeneXpert MRSA Assay (FDA approved for NASAL specimens only), is one component of a comprehensive MRSA colonization surveillance program. It is not intended to diagnose MRSA infection nor to guide or monitor treatment for MRSA infections. Test performance is not FDA approved in patients less than 48 years old. Performed at [facility], [address] [place], [place] [address]   Aerobic/Anaerobic Culture w Gram Stain (surgical/deep wound)     Status: None (Preliminary  result)   Collection Time: 03/10/24 11:19 AM   Specimen: Soft Tissue, Other  Result Value Ref Range Status   Specimen Description TISSUE  Final   Special Requests LEFT CALF WOUND  Final   Gram Stain   Final    FEW WBC  PRESENT, PREDOMINANTLY PMN RARE GRAM NEGATIVE RODS    Culture   Final    CULTURE REINCUBATED FOR BETTER GROWTH Performed at [facility], [address], [place], [place] [address]    Report Status PENDING  Incomplete         Radiology Studies: No results found.   Scheduled Meds:  acetaminophen   1,000 mg Oral Q8H   docusate sodium   100 mg Oral BID   empagliflozin   25 mg Oral Daily   enoxaparin  (LOVENOX ) injection  70 mg Subcutaneous Daily   insulin  aspart  0-9 Units Subcutaneous Q4H   lamoTRIgine   250 mg Oral Daily   pantoprazole   40 mg Oral QAC breakfast   rosuvastatin   10 mg Oral Daily   sodium chloride  flush  3-10 mL Intravenous Q12H   Continuous Infusions:  meropenem (MERREM) IV       LOS: 4 days    Time spent: 36 min Barbee Lew, MD  03/11/2024, 12:51 PM

## 2024-03-11 NOTE — Plan of Care (Signed)
  Problem: Education: Goal: Ability to describe self-care measures that may prevent or decrease complications (Diabetes Survival Skills Education) will improve Outcome: Progressing Goal: Individualized Educational Video(s) Outcome: Progressing   Problem: Coping: Goal: Ability to adjust to condition or change in health will improve Outcome: Progressing   Problem: Fluid Volume: Goal: Ability to maintain a balanced intake and output will improve Outcome: Progressing   Problem: Health Behavior/Discharge Planning: Goal: Ability to identify and utilize available resources and services will improve Outcome: Progressing Goal: Ability to manage health-related needs will improve Outcome: Progressing   Problem: Metabolic: Goal: Ability to maintain appropriate glucose levels will improve Outcome: Progressing   Problem: Nutritional: Goal: Maintenance of adequate nutrition will improve Outcome: Progressing Goal: Progress toward achieving an optimal weight will improve Outcome: Progressing   Problem: Skin Integrity: Goal: Risk for impaired skin integrity will decrease Outcome: Progressing   Problem: Tissue Perfusion: Goal: Adequacy of tissue perfusion will improve Outcome: Progressing   Problem: Education: Goal: Knowledge of General Education information will improve Description: Including pain rating scale, medication(s)/side effects and non-pharmacologic comfort measures Outcome: Progressing   Problem: Health Behavior/Discharge Planning: Goal: Ability to manage health-related needs will improve Outcome: Progressing   Problem: Clinical Measurements: Goal: Ability to maintain clinical measurements within normal limits will improve Outcome: Progressing Goal: Will remain free from infection Outcome: Progressing Goal: Diagnostic test results will improve Outcome: Progressing Goal: Respiratory complications will improve Outcome: Progressing Goal: Cardiovascular complication will  be avoided Outcome: Progressing   Problem: Activity: Goal: Risk for activity intolerance will decrease Outcome: Progressing   Problem: Nutrition: Goal: Adequate nutrition will be maintained Outcome: Progressing   Problem: Coping: Goal: Level of anxiety will decrease Outcome: Progressing   Problem: Elimination: Goal: Will not experience complications related to bowel motility Outcome: Progressing Goal: Will not experience complications related to urinary retention Outcome: Progressing   Problem: Pain Managment: Goal: General experience of comfort will improve and/or be controlled Outcome: Progressing   Problem: Safety: Goal: Ability to remain free from injury will improve Outcome: Progressing   Problem: Skin Integrity: Goal: Risk for impaired skin integrity will decrease Outcome: Progressing   Problem: Clinical Measurements: Goal: Ability to avoid or minimize complications of infection will improve Outcome: Progressing   Problem: Skin Integrity: Goal: Skin integrity will improve Outcome: Progressing   Problem: Fluid Volume: Goal: Hemodynamic stability will improve Outcome: Progressing   Problem: Clinical Measurements: Goal: Diagnostic test results will improve Outcome: Progressing Goal: Signs and symptoms of infection will decrease Outcome: Progressing   Problem: Respiratory: Goal: Ability to maintain adequate ventilation will improve Outcome: Progressing

## 2024-03-11 NOTE — Evaluation (Signed)
 Physical Therapy Evaluation & Discharge Patient Details Name: Joshua Conner MRN: [id number] DOB: December 31, 1970 Today's Date: 03/11/2024  History of Present Illness  Pt is a 53 y.o. male who presented 03/07/24 with increased redness and purulent drainage from L calf wound. Found to have L leg abscess and chronic osteomyeltits. S/p debridement and wound vac application 5/16. Pt sustained traumatic laceration to L leg 02/03/24. PMH: DM, seizures, varicose veins   Clinical Impression  Pt presents with condition above. PTA, he was independent without DME, living alone in a 2-level house with a level entrance. He currently is able to perform all functional mobility with mod I without an AD, needing extra time for some mobility due to pain in his L leg. He does display a mildly antalgic gait pattern due to the pain as well, but is able to perform all functional mobility, including stairs without LOB or need for assistance. All education completed and questions answered. He will likely progress well and quickly as his pain improves. No further skilled PT needs identified. Will sign off. Thank you for this referral.       If plan is discharge home, recommend the following: Assistance with cooking/housework;Assist for transportation   Can travel by private vehicle        Equipment Recommendations None recommended by PT  Recommendations for Other Services       Functional Status Assessment Patient has had a recent decline in their functional status and demonstrates the ability to make significant improvements in function in a reasonable and predictable amount of time.     Precautions / Restrictions Precautions Precautions: Other (comment) Precaution/Restrictions Comments: L leg wound vac Restrictions Weight Bearing Restrictions Per Provider Order: Yes LLE Weight Bearing Per Provider Order: Weight bearing as tolerated      Mobility  Bed Mobility Overal bed mobility: Modified Independent              General bed mobility comments: HOB elevated, no assistance needed    Transfers Overall transfer level: Independent Equipment used: None               General transfer comment: Able to stand from EOB several times without LOB or assistance    Ambulation/Gait Ambulation/Gait assistance: Modified independent (Device/Increase time) Gait Distance (Feet): 220 Feet Assistive device: None Gait Pattern/deviations: Step-through pattern, Decreased stance time - left, Decreased weight shift to left, Antalgic Gait velocity: mildly reduced Gait velocity interpretation: >2.62 ft/sec, indicative of community ambulatory   General Gait Details: Pt ambulates steadily with a mildly slowed antalgic gait pattern due to L leg pain with weight bearing. No LOB, mod I.  Stairs Stairs: Yes Stairs assistance: Modified independent (Device/Increase time) Stair Management: One rail Right, One rail Left, Two rails, Step to pattern, Alternating pattern, Forwards Number of Stairs: 10 (x4 standard height, x6 short height) General stair comments: Pt alternated between using R vs L vs bil rails for stairs. He also intermittently would progress to alternating step pattern when ascending but often navigated stairs with step-to pattern. No LOB, mod I. Pt already practicing leading up with his R foot and down with his L before needing cuing/education on this.  Wheelchair Mobility     Tilt Bed    Modified Rankin (Stroke Patients Only)       Balance Overall balance assessment: No apparent balance deficits (not formally assessed)  Pertinent Vitals/Pain Pain Assessment Pain Assessment: Faces Faces Pain Scale: Hurts little more Pain Location: L leg Pain Descriptors / Indicators: Operative site guarding, Sore, Tightness Pain Intervention(s): Limited activity within patient's tolerance, Monitored during session, Repositioned    Home  Living Family/patient expects to be discharged to:: Private residence Living Arrangements: Alone (daughter intermittently stays with him) Available Help at Discharge: Family;Available PRN/intermittently Type of Home: House Home Access: Level entry     Alternate Level Stairs-Number of Steps: flight Home Layout: Two level;1/2 bath on main level;Bed/bath upstairs;Able to live on main level with bedroom/bathroom Home Equipment: None      Prior Function Prior Level of Function : Independent/Modified Independent;Driving;Working/employed             Mobility Comments: No AD ADLs Comments: Works for [employer] of [employer] and works coaching high school baseball     Extremity/Trunk Assessment   Upper Extremity Assessment Upper Extremity Assessment: Overall WFL for tasks assessed    Lower Extremity Assessment Lower Extremity Assessment: LLE deficits/detail LLE Deficits / Details: WFL AROM but reports tightness in gastrocs; denied numbness/tingling; WFL strength; bandage and wound vac to L lower leg    Cervical / Trunk Assessment Cervical / Trunk Assessment: Normal  Communication   Communication Communication: No apparent difficulties    Cognition Arousal: Alert Behavior During Therapy: WFL for tasks assessed/performed   PT - Cognitive impairments: No apparent impairments                         Following commands: Intact       Cueing Cueing Techniques: Verbal cues     General Comments General comments (skin integrity, edema, etc.): educated pt on how to manage his wound vac and ensure he does not trip himself; educated pt on calf stretches to tolerance on L    Exercises     Assessment/Plan    PT Assessment Patient does not need any further PT services  PT Problem List         PT Treatment Interventions      PT Goals (Current goals can be found in the Care Plan section)  Acute Rehab PT Goals Patient Stated Goal: to heal PT Goal Formulation: All  assessment and education complete, DC therapy Time For Goal Achievement: 03/12/24 Potential to Achieve Goals: Good    Frequency       Co-evaluation               AM-PAC PT "6 Clicks" Mobility  Outcome Measure Help needed turning from your back to your side while in a flat bed without using bedrails?: None Help needed moving from lying on your back to sitting on the side of a flat bed without using bedrails?: None Help needed moving to and from a bed to a chair (including a wheelchair)?: None Help needed standing up from a chair using your arms (e.g., wheelchair or bedside chair)?: None Help needed to walk in hospital room?: None Help needed climbing 3-5 steps with a railing? : None 6 Click Score: 24    End of Session   Activity Tolerance: Patient tolerated treatment well Patient left: in chair;with call bell/phone within reach Nurse Communication: Mobility status (pt independent) PT Visit Diagnosis: Other abnormalities of gait and mobility (R26.89);Pain Pain - Right/Left: Left Pain - part of body: Leg    Time: 1131-1151 PT Time Calculation (min) (ACUTE ONLY): 20 min   Charges:   PT Evaluation $PT Eval Low Complexity: 1  Low   PT General Charges $$ ACUTE PT VISIT: 1 Visit         Vernida Goodie, PT, DPT Acute Rehabilitation Services  Office: [phone]   Ellyn Hack 03/11/2024, 1:02 PM

## 2024-03-12 DIAGNOSIS — L03116 Cellulitis of left lower limb: Secondary | ICD-10-CM | POA: Diagnosis not present

## 2024-03-12 LAB — BASIC METABOLIC PANEL WITH GFR
Anion gap: 8 (ref 5–15)
BUN: 18 mg/dL (ref 6–20)
CO2: 27 mmol/L (ref 22–32)
Calcium: 8.6 mg/dL — ABNORMAL LOW (ref 8.9–10.3)
Chloride: 101 mmol/L (ref 98–111)
Creatinine, Ser: 1.4 mg/dL — ABNORMAL HIGH (ref 0.61–1.24)
GFR, Estimated: >60 mL/min (ref >60)
Glucose, Bld: 161 mg/dL — ABNORMAL HIGH (ref 70–99)
Potassium: 3.9 mmol/L (ref 3.5–5.1)
Sodium: 136 mmol/L (ref 135–145)

## 2024-03-12 LAB — GLUCOSE, CAPILLARY
Glucose-Capillary: 107 mg/dL — ABNORMAL HIGH (ref 70–99)
Glucose-Capillary: 109 mg/dL — ABNORMAL HIGH (ref 70–99)
Glucose-Capillary: 109 mg/dL — ABNORMAL HIGH (ref 70–99)
Glucose-Capillary: 122 mg/dL — ABNORMAL HIGH (ref 70–99)
Glucose-Capillary: 144 mg/dL — ABNORMAL HIGH (ref 70–99)
Glucose-Capillary: 151 mg/dL — ABNORMAL HIGH (ref 70–99)
Glucose-Capillary: 159 mg/dL — ABNORMAL HIGH (ref 70–99)

## 2024-03-12 NOTE — Progress Notes (Signed)
 PROGRESS NOTE    Joshua Conner  WGN:[id number] DOB: 1970-12-09 DOA: 03/07/2024 PCP: Jimmey Mould, MD   Brief Narrative:  53 y.o. male with PMH significant for DM2, HTN, HLD, morbid obesity, OSA on CPAP, seizure disorder, GERD, meningioma, varicose veins s/p ablation. On 02/03/24, while at work, piece of quartz fell on his left leg and sustained traumatic laceration.  The wound got infected eventually.  Since then, patient has had multiple courses of antibiotics, evaluation of the wound care center and also hospitalization for IV antibiotics in April. 5/12, 1 day prior to presentation, seen at wound care center. Underwent debridement. 5/13, patient noticed increasing redness around the wound and purulent drainage from the wound itself.  Later in the day, he had a scheduled appointment with ID but prior to their appointment, patient presented to the ED with foul-smelling drainage, subjective fever, nausea, vomiting and a concern of recurrent infection of chronic wound.    In the ED, patient was afebrile but tachycardic to 130s, blood pressure maintained Labs with WBC of 14.1, glucose 218, creatinine 1.3, lactic acid normal at 1.4 Left tibia/fibula x-ray negative for any osteomyelitis Blood culture was sent Patient was started on broad-spectrum IV antibiotic coverage with IV cefepime , IV Zosyn , IV vancomycin  Admitted to [facility] ID and wound care consulted  Assessment & Plan:   Principal Problem:   Sepsis (HCC) Active Problems:   Cellulitis of left lower extremity   Controlled type 2 diabetes mellitus without complication, without long-term current use of insulin  (HCC)   Hyperlipidemia   Cellulitis   History of seizure disorder   CKD stage 3a, GFR 45-59 ml/min (HCC)   Obesity, Class III, BMI 40-49.9 (morbid obesity)   OSA (obstructive sleep apnea)   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 Julio Ohm 03/10/2024.  Patient failed outpatient debridement as well as multiple courses of inpatient and outpatient antibiotics.  Met criteria for sepsis on admission with leukocytosis tachycardia lactic acidosis.  He was afebrile on admission.  X-ray of the left tibia-fibula at the time of admission was negative for osteomyelitis.  Since patient continued to complain of pain a MRI of the left lower extremity was done which was concerning for abscess.  He was seen by Dr. Lucienne Ryder who transferred patient to [facility] to be seen by Dr. Julio Ohm and further debridement.   He was initially on Levaquin  and Zyvox  and this is being switched to cefepime  and vancomycin  on May 16.  ID following. Status post left calf excisional debridement with excision skin soft tissue muscle bone and fascia.  Wound VAC in place.dr Julio Ohm 5/16 Cultures GNR Meropenem started 5/17 For repeat surgical debridement on Wednesday Will need long course of antibiotics and PICC line   Type 2 diabetes mellitus A1c 6.3 from April 2025 PTA meds-Jardiance  25 mg daily. Currently on SSI/Accu-Cheks.  Resume Jardiance  Last Labs         Recent Labs  Lab 03/08/24 2028 03/08/24 2307 03/09/24 0459 03/09/24 0716 03/09/24 1228  GLUCAP 128* 119* 115* 120* 123*      HLD Crestor    H/o seizure disorder Continue Lamictal    Morbid Obesity  Body mass index is 40.99 kg/m. Patient has been advised to make an attempt to improve diet and exercise patterns to aid in weight loss.   OSA On CPAP   GERD PPI   H/o varicose vein s/p ablation  several years ago on both legs    Estimated body mass index is 40.99 kg/m as calculated from the following:   Height as of this encounter: 6' (1.829 m).   Weight as of this encounter: 137.1 kg.  DVT prophylaxis: LOVENOX  Code Status:full Family Communication: none Disposition Plan:  Status is: Inpatient Remains inpatient appropriate because: acute illness   Consultants:  ID  DR DUDA  Procedures: EXCISIONAL DEBRIDEMENT LLE 5/16 Antimicrobials: VANC CEFEPIME   Subjective:  He is resting in bed no events overnight reported or noted creatinine 1.49 Objective: Vitals:   03/11/24 1605 03/11/24 2046 03/12/24 0540 03/12/24 0752  BP: 122/75 118/77 117/76 106/69  Pulse: 86 86 72 74  Resp: 18 17 16 18   Temp: 97.8 F (36.6 C) 97.8 F (36.6 C) 97.9 F (36.6 C) 97.7 F (36.5 C)  TempSrc:    Oral  SpO2: 99% 98% 98% 95%  Weight:      Height:        Intake/Output Summary (Last 24 hours) at 03/12/2024 1345 Last data filed at 03/12/2024 0900 Gross per 24 hour  Intake 3.94 ml  Output 0 ml  Net 3.94 ml   Filed Weights   03/08/24 0006 03/10/24 0955  Weight: (!) 137.1 kg (!) 137.1 kg    Examination:  General exam: Appears in no acute distress  respiratory system: Clear to auscultation. Respiratory effort normal. Cardiovascular system: S1 & S2 heard, RRR. No JVD, murmurs, rubs, gallops or clicks. No pedal edema. Gastrointestinal system: Abdomen is nondistended, soft and nontender. No organomegaly or masses felt. Normal bowel sounds heard. Central nervous system: Alert and oriented. No focal neurological deficits. Extremities: Left lower extremity covered with a bandage   Data Reviewed: I have personally reviewed following labs and imaging studies  CBC: Recent Labs  Lab 03/07/24 2224 03/08/24 0554 03/09/24 0553  WBC 14.1* 9.5 7.9  NEUTROABS 11.6*  --  5.6  HGB 14.1 11.7* 13.3  HCT 43.3 36.8* 41.2  MCV 91.4 93.2 92.4  PLT 271 207 220   Basic Metabolic Panel: Recent Labs  Lab 03/07/24 2224 03/08/24 0554 03/09/24 0553 03/11/24 0454 03/12/24 1005  NA 140 140 136 133* 136  K 3.6 3.9 4.3 4.2 3.9  CL 107 109 103 101 101  CO2 21* 24 24 24 27   GLUCOSE 218* 131* 106* 195* 161*  BUN 16 18 14  25* 18  CREATININE 1.30* 1.12 1.19 1.49* 1.40*  CALCIUM  9.0 8.3* 9.1 8.5* 8.6*  MG  --  2.0  --   --   --   PHOS  --  4.0  --   --   --    GFR: Estimated  Creatinine Clearance: 88.5 mL/min (A) (by C-G formula based on SCr of 1.4 mg/dL (H)). Liver Function Tests: Recent Labs  Lab 03/07/24 2224 03/08/24 0554  AST 15 11*  ALT 30 21  ALKPHOS 67 50  BILITOT 0.9 0.7  PROT 7.1 5.8*  ALBUMIN 4.0 3.3*   No results for input(s): "LIPASE", "AMYLASE" in the last 168 hours. No results for input(s): "AMMONIA" in the last 168 hours. Coagulation Profile: Recent Labs  Lab 03/07/24 2234  INR 1.0   Cardiac Enzymes: No results for input(s): "CKTOTAL", "CKMB", "CKMBINDEX", "TROPONINI" in the last 168 hours. BNP (last 3 results) No results for input(s): "PROBNP" in the last 8760 hours. HbA1C: No results for input(s): "HGBA1C" in the last 72 hours.  CBG: Recent Labs  Lab 03/11/24 2044 03/12/24 0012 03/12/24 0515 03/12/24 0750 03/12/24 1137  GLUCAP 184* 122* 107* 109* 144*   Lipid Profile: No results for input(s): "CHOL", "HDL", "LDLCALC", "TRIG", "CHOLHDL", "LDLDIRECT" in the last 72 hours. Thyroid Function Tests: No results for input(s): "TSH", "T4TOTAL", "FREET4", "T3FREE", "THYROIDAB" in the last 72 hours. Anemia Panel: No results for input(s): "VITAMINB12", "FOLATE", "FERRITIN", "TIBC", "IRON", "RETICCTPCT" in the last 72 hours. Sepsis Labs: Recent Labs  Lab 03/07/24 2233  LATICACIDVEN 1.4    Recent Results (from the past 240 hours)  Culture, blood (Routine x 2)     Status: None (Preliminary result)   Collection Time: 03/07/24 10:24 PM   Specimen: BLOOD  Result Value Ref Range Status   Specimen Description   Final    BLOOD SITE NOT SPECIFIED Performed at [facility], [address], [place], [place] [address]    Special Requests   Final    BOTTLES DRAWN AEROBIC AND ANAEROBIC Blood Culture adequate volume Performed at [facility], [address], [place], [place] [address]    Culture   Final    NO GROWTH 4 DAYS Performed at [facility], [address] [place], [place]  [address]    Report Status PENDING  Incomplete  Culture, blood (Routine x 2)     Status: None (Preliminary result)   Collection Time: 03/07/24 10:29 PM   Specimen: BLOOD LEFT HAND  Result Value Ref Range Status   Specimen Description   Final    BLOOD LEFT HAND Performed at [facility], [address] [place], [place] [address]    Special Requests   Final    BOTTLES DRAWN AEROBIC AND ANAEROBIC Blood Culture adequate volume Performed at [facility], [address], [place], [place] [address]    Culture   Final    NO GROWTH 4 DAYS Performed at [facility], [address], [place], [place] [address]    Report Status PENDING  Incomplete  MRSA Next Gen by PCR, Nasal     Status: None   Collection Time: 03/08/24  8:54 PM   Specimen: Nasal Mucosa; Nasal Swab  Result Value Ref Range Status   MRSA by PCR Next Gen NOT DETECTED NOT DETECTED Final    Comment: (NOTE) The GeneXpert MRSA Assay (FDA approved for NASAL specimens only), is one component of a comprehensive MRSA colonization surveillance program. It is not intended to diagnose MRSA infection nor to guide or monitor treatment for MRSA infections. Test performance is not FDA approved in patients less than 49 years old. Performed at [facility], [address], [place], [place] [address]   MRSA Next Gen by PCR, Nasal     Status: None   Collection Time: 03/10/24  8:14 AM   Specimen: Nasal Mucosa; Nasal Swab  Result Value Ref Range Status   MRSA by PCR Next Gen NOT DETECTED NOT DETECTED Final    Comment: (NOTE) The GeneXpert MRSA Assay (FDA approved for NASAL specimens only), is one component of a comprehensive MRSA colonization surveillance program. It is not intended to diagnose MRSA infection nor to guide or monitor treatment for MRSA infections. Test performance is not FDA approved in patients less than 20 years old. Performed at [facility], [address], [place], [place] [address]   Aerobic/Anaerobic Culture w Gram Stain (surgical/deep wound)     Status: None (Preliminary result)   Collection Time: 03/10/24 11:19 AM   Specimen: Soft Tissue, Other  Result Value Ref Range Status  Specimen Description TISSUE  Final   Special Requests LEFT CALF WOUND  Final   Gram Stain   Final    FEW WBC PRESENT, PREDOMINANTLY PMN RARE GRAM NEGATIVE RODS    Culture   Final    CULTURE REINCUBATED FOR BETTER GROWTH Performed at [facility], [address], [place], [place] [address]    Report Status PENDING  Incomplete         Radiology Studies: No results found.   Scheduled Meds:  acetaminophen   1,000 mg Oral Q8H   docusate sodium   100 mg Oral BID   empagliflozin   25 mg Oral Daily   enoxaparin  (LOVENOX ) injection  70 mg Subcutaneous Daily   insulin  aspart  0-9 Units Subcutaneous Q4H   lamoTRIgine   250 mg Oral Daily   pantoprazole   40 mg Oral QAC breakfast   polyethylene glycol  17 g Oral BID   rosuvastatin   10 mg Oral Daily   senna-docusate  2 tablet Oral QHS   sodium chloride  flush  3-10 mL Intravenous Q12H   Continuous Infusions:  meropenem (MERREM) IV 1 g (03/12/24 0513)     LOS: 5 days    Time spent: 36 min Barbee Lew, MD  03/12/2024, 1:45 PM

## 2024-03-12 NOTE — Plan of Care (Signed)
  Problem: Metabolic: Goal: Ability to maintain appropriate glucose levels will improve Outcome: Progressing   Problem: Skin Integrity: Goal: Risk for impaired skin integrity will decrease Outcome: Progressing   Problem: Clinical Measurements: Goal: Diagnostic test results will improve Outcome: Progressing

## 2024-03-12 NOTE — Progress Notes (Signed)
 Subjective: 2 Days Post-Op Procedure(s) (LRB): LEFT CALF DEBRIDEMENT (Left) APPLICATION, WOUND VAC (Left) Patient reports pain as mild.  Feeling well this am.   Objective: Vital signs in last 24 hours: Temp:  [97.7 F (36.5 C)-97.9 F (36.6 C)] 97.7 F (36.5 C) (05/18 0752) Pulse Rate:  [72-86] 74 (05/18 0752) Resp:  [16-18] 18 (05/18 0752) BP: (106-122)/(69-77) 106/69 (05/18 0752) SpO2:  [95 %-99 %] 95 % (05/18 0752)  Intake/Output from previous day: 05/17 0701 - 05/18 0700 In: 3.9 [IV Piggyback:3.9] Out: 10 [Drains:10] Intake/Output this shift: No intake/output data recorded.  No results for input(s): "HGB" in the last 72 hours. No results for input(s): "WBC", "RBC", "HCT", "PLT" in the last 72 hours. Recent Labs    03/11/24 0454  NA 133*  K 4.2  CL 101  CO2 24  BUN 25*  CREATININE 1.49*  GLUCOSE 195*  CALCIUM  8.5*   No results for input(s): "LABPT", "INR" in the last 72 hours.  Neurologically intact Neurovascular intact Sensation intact distally Intact pulses distally Dorsiflexion/Plantar flexion intact Wound vac in place with good seal.  50 cc serosanguinous fluid in canister   Assessment/Plan: 2 Days Post-Op Procedure(s) (LRB): LEFT CALF DEBRIDEMENT (Left) APPLICATION, WOUND VAC (Left) PLAN Continue with wound vac Cx growing gram negative rods.  Abx switched to meropenem yesterday Plan for repeat surgical debridement Wednesday followed by 6 weeks abx via picc line       Sandie Cross 03/12/2024, 10:05 AM

## 2024-03-13 ENCOUNTER — Other Ambulatory Visit: Payer: Self-pay

## 2024-03-13 ENCOUNTER — Encounter (HOSPITAL_COMMUNITY): Payer: Self-pay | Admitting: Orthopedic Surgery

## 2024-03-13 ENCOUNTER — Ambulatory Visit (HOSPITAL_BASED_OUTPATIENT_CLINIC_OR_DEPARTMENT_OTHER): Admitting: Internal Medicine

## 2024-03-13 DIAGNOSIS — L03116 Cellulitis of left lower limb: Secondary | ICD-10-CM | POA: Diagnosis not present

## 2024-03-13 LAB — CULTURE, BLOOD (ROUTINE X 2)
Culture: NO GROWTH
Culture: NO GROWTH
Special Requests: ADEQUATE
Special Requests: ADEQUATE

## 2024-03-13 LAB — BASIC METABOLIC PANEL WITH GFR
Anion gap: 9 (ref 5–15)
BUN: 19 mg/dL (ref 6–20)
CO2: 27 mmol/L (ref 22–32)
Calcium: 9 mg/dL (ref 8.9–10.3)
Chloride: 100 mmol/L (ref 98–111)
Creatinine, Ser: 1.35 mg/dL — ABNORMAL HIGH (ref 0.61–1.24)
GFR, Estimated: >60 mL/min (ref >60)
Glucose, Bld: 97 mg/dL (ref 70–99)
Potassium: 3.8 mmol/L (ref 3.5–5.1)
Sodium: 136 mmol/L (ref 135–145)

## 2024-03-13 LAB — CBC
HCT: 36.9 % — ABNORMAL LOW (ref 39.0–52.0)
Hemoglobin: 11.9 g/dL — ABNORMAL LOW (ref 13.0–17.0)
MCH: 29 pg (ref 26.0–34.0)
MCHC: 32.2 g/dL (ref 30.0–36.0)
MCV: 90 fL (ref 80.0–100.0)
Platelets: 314 10*3/uL (ref 150–400)
RBC: 4.1 MIL/uL — ABNORMAL LOW (ref 4.22–5.81)
RDW: 13.9 % (ref 11.5–15.5)
WBC: 5.2 10*3/uL (ref 4.0–10.5)
nRBC: 0 % (ref 0.0–0.2)

## 2024-03-13 LAB — GLUCOSE, CAPILLARY
Glucose-Capillary: 118 mg/dL — ABNORMAL HIGH (ref 70–99)
Glucose-Capillary: 99 mg/dL (ref 70–99)
Glucose-Capillary: 183 mg/dL — ABNORMAL HIGH (ref 70–99)
Glucose-Capillary: 122 mg/dL — ABNORMAL HIGH (ref 70–99)
Glucose-Capillary: 174 mg/dL — ABNORMAL HIGH (ref 70–99)

## 2024-03-13 MED ORDER — INSULIN ASPART 100 UNIT/ML IJ SOLN
0.0000 [IU] | Freq: Three times a day (TID) | INTRAMUSCULAR | Status: DC
  Administered 2024-03-13: 1 [IU] via SUBCUTANEOUS
  Administered 2024-03-14 – 2024-03-16 (×3): 2 [IU] via SUBCUTANEOUS
  Administered 2024-03-16: 1 [IU] via SUBCUTANEOUS

## 2024-03-13 MED ORDER — SODIUM CHLORIDE 0.9 % IV SOLN
2.0000 g | Freq: Three times a day (TID) | INTRAVENOUS | Status: DC
  Administered 2024-03-13 – 2024-03-16 (×10): 2 g via INTRAVENOUS
  Filled 2024-03-13 (×10): qty 12.5

## 2024-03-13 MED ORDER — SODIUM CHLORIDE 0.9% FLUSH
10.0000 mL | Freq: Two times a day (BID) | INTRAVENOUS | Status: DC
  Administered 2024-03-13 – 2024-03-14 (×4): 10 mL
  Administered 2024-03-15: 20 mL
  Administered 2024-03-15: 10 mL

## 2024-03-13 MED ORDER — SODIUM CHLORIDE 0.9% FLUSH
10.0000 mL | INTRAVENOUS | Status: DC | PRN
  Administered 2024-03-16: 10 mL

## 2024-03-13 MED ORDER — CHLORHEXIDINE GLUCONATE CLOTH 2 % EX PADS
6.0000 | MEDICATED_PAD | Freq: Every day | CUTANEOUS | Status: DC
  Administered 2024-03-13 – 2024-03-16 (×4): 6 via TOPICAL

## 2024-03-13 NOTE — Progress Notes (Addendum)
 PROGRESS NOTE    Joshua Conner  WGN:[id number] DOB: 1971-01-16 DOA: 03/07/2024 PCP: Jimmey Mould, MD   Brief Narrative:  53 y.o. male with PMH significant for DM2, HTN, HLD, morbid obesity, OSA on CPAP, seizure disorder, GERD, meningioma, varicose veins s/p ablation. On 02/03/24, while at work, piece of quartz fell on his left leg and sustained traumatic laceration.  The wound got infected eventually.  Since then, patient has had multiple courses of antibiotics, evaluation of the wound care center and also hospitalization for IV antibiotics in April. 5/12, 1 day prior to presentation, seen at wound care center. Underwent debridement. 5/13, patient noticed increasing redness around the wound and purulent drainage from the wound itself.  Later in the day, he had a scheduled appointment with ID but prior to their appointment, patient presented to the ED with foul-smelling drainage, subjective fever, nausea, vomiting and a concern of recurrent infection of chronic wound.    In the ED, patient was afebrile but tachycardic to 130s, blood pressure maintained Labs with WBC of 14.1, glucose 218, creatinine 1.3, lactic acid normal at 1.4 Left tibia/fibula x-ray negative for any osteomyelitis Blood culture was sent Patient was started on broad-spectrum IV antibiotic coverage with IV cefepime , IV Zosyn , IV vancomycin  Admitted to [facility] ID and wound care consulted  Assessment & Plan:   Principal Problem:   Sepsis (HCC) Active Problems:   Cellulitis of left lower extremity   Controlled type 2 diabetes mellitus without complication, without long-term current use of insulin  (HCC)   Hyperlipidemia   Cellulitis   History of seizure disorder   CKD stage 3a, GFR 45-59 ml/min (HCC)   Obesity, Class III, BMI 40-49.9 (morbid obesity)   OSA (obstructive sleep apnea)   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 Julio Ohm 03/10/2024.   Patient failed outpatient debridement as well as multiple courses of inpatient and outpatient antibiotics.   Met criteria for sepsis on admission with leukocytosis tachycardia lactic acidosis.   He was afebrile on admission.   X-ray of the left tibia-fibula at the time of admission was negative for osteomyelitis.  Since patient continued to complain of pain a MRI of the left lower extremity was done which was concerning for abscess.   He was seen by Dr. Lucienne Ryder who transferred patient to [facility] to be seen by Dr. Julio Ohm and further debridement.   He was initially on Levaquin  and Zyvox  and this is being switched to cefepime  and vancomycin  on May 16.   ID following signed off on May 19.  They have recommended a PICC line and 6 weeks of cefepime .  Surgical culture growing enterococcal cloacae, sensitivities pending. For repeat surgical debridement on Wednesday 5/21   Type 2 diabetes mellitus A1c 6.3 from April 2025 PTA meds-Jardiance  25 mg daily. Currently on SSI/Accu-Cheks.  Resume Jardiance  Last Labs         Recent Labs  Lab 03/08/24 2028 03/08/24 2307 03/09/24 0459 03/09/24 0716 03/09/24 1228  GLUCAP 128* 119* 115* 120* 123*      HLD Crestor    H/o seizure disorder Continue Lamictal    Morbid Obesity  Body mass index is 40.99 kg/m. Patient has been advised to make an attempt to improve diet and exercise patterns to aid in weight loss.   OSA On CPAP   GERD PPI   H/o varicose vein s/p ablation  several years ago  on both legs    Estimated body mass index is 40.99 kg/m as calculated from the following:   Height as of this encounter: 6' (1.829 m).   Weight as of this encounter: 137.1 kg.  DVT prophylaxis: LOVENOX  Code Status:full Family Communication: none Disposition Plan:  Status is: Inpatient Remains inpatient appropriate because: acute illness   Consultants:  ID DR DUDA  Procedures: EXCISIONAL DEBRIDEMENT LLE  5/16 Antimicrobials: CEFEPIME   Subjective: No new complaints slept well  Objective: Vitals:   03/12/24 1538 03/12/24 2038 03/13/24 0500 03/13/24 0826  BP: 110/72 110/75 113/81 135/85  Pulse: 80 82 72 78  Resp: 18 18 18 19   Temp: (!) 97.5 F (36.4 C) 97.8 F (36.6 C) 97.8 F (36.6 C) 97.6 F (36.4 C)  TempSrc: Oral Oral Oral Oral  SpO2: 100% 93% 94% 98%  Weight:      Height:        Intake/Output Summary (Last 24 hours) at 03/13/2024 1112 Last data filed at 03/13/2024 1610 Gross per 24 hour  Intake --  Output 400 ml  Net -400 ml   Filed Weights   03/08/24 0006 03/10/24 0955  Weight: (!) 137.1 kg (!) 137.1 kg    Examination:  General exam: Appears in no acute distress  respiratory system: Clear to auscultation. Respiratory effort normal. Cardiovascular system: S1 & S2 heard, RRR. No JVD, murmurs, rubs, gallops or clicks. No pedal edema. Gastrointestinal system: Abdomen is nondistended, soft and nontender. No organomegaly or masses felt. Normal bowel sounds heard. Central nervous system: Alert and oriented. No focal neurological deficits. Extremities: Left lower extremity covered with a bandage   Data Reviewed: I have personally reviewed following labs and imaging studies  CBC: Recent Labs  Lab 03/07/24 2224 03/08/24 0554 03/09/24 0553 03/13/24 0733  WBC 14.1* 9.5 7.9 5.2  NEUTROABS 11.6*  --  5.6  --   HGB 14.1 11.7* 13.3 11.9*  HCT 43.3 36.8* 41.2 36.9*  MCV 91.4 93.2 92.4 90.0  PLT 271 207 220 314   Basic Metabolic Panel: Recent Labs  Lab 03/08/24 0554 03/09/24 0553 03/11/24 0454 03/12/24 1005 03/13/24 0733  NA 140 136 133* 136 136  K 3.9 4.3 4.2 3.9 3.8  CL 109 103 101 101 100  CO2 24 24 24 27 27   GLUCOSE 131* 106* 195* 161* 97  BUN 18 14 25* 18 19  CREATININE 1.12 1.19 1.49* 1.40* 1.35*  CALCIUM  8.3* 9.1 8.5* 8.6* 9.0  MG 2.0  --   --   --   --   PHOS 4.0  --   --   --   --    GFR: Estimated Creatinine Clearance: 91.8 mL/min (A) (by C-G  formula based on SCr of 1.35 mg/dL (H)). Liver Function Tests: Recent Labs  Lab 03/07/24 2224 03/08/24 0554  AST 15 11*  ALT 30 21  ALKPHOS 67 50  BILITOT 0.9 0.7  PROT 7.1 5.8*  ALBUMIN 4.0 3.3*   No results for input(s): "LIPASE", "AMYLASE" in the last 168 hours. No results for input(s): "AMMONIA" in the last 168 hours. Coagulation Profile: Recent Labs  Lab 03/07/24 2234  INR 1.0   Cardiac Enzymes: No results for input(s): "CKTOTAL", "CKMB", "CKMBINDEX", "TROPONINI" in the last 168 hours. BNP (last 3 results) No results for input(s): "PROBNP" in the last 8760 hours. HbA1C: No results for input(s): "HGBA1C" in the last 72 hours.  CBG: Recent Labs  Lab 03/12/24 1703 03/12/24 1944 03/12/24 2353 03/13/24 0356 03/13/24 0829  GLUCAP 159*  151* 109* 118* 99   Lipid Profile: No results for input(s): "CHOL", "HDL", "LDLCALC", "TRIG", "CHOLHDL", "LDLDIRECT" in the last 72 hours. Thyroid Function Tests: No results for input(s): "TSH", "T4TOTAL", "FREET4", "T3FREE", "THYROIDAB" in the last 72 hours. Anemia Panel: No results for input(s): "VITAMINB12", "FOLATE", "FERRITIN", "TIBC", "IRON", "RETICCTPCT" in the last 72 hours. Sepsis Labs: Recent Labs  Lab 03/07/24 2233  LATICACIDVEN 1.4    Recent Results (from the past 240 hours)  Culture, blood (Routine x 2)     Status: None   Collection Time: 03/07/24 10:24 PM   Specimen: BLOOD  Result Value Ref Range Status   Specimen Description   Final    BLOOD SITE NOT SPECIFIED Performed at [facility], [address], [place], [place] [address]    Special Requests   Final    BOTTLES DRAWN AEROBIC AND ANAEROBIC Blood Culture adequate volume Performed at [facility], [address], [place], [place] [address]    Culture   Final    NO GROWTH 5 DAYS Performed at [facility], [address] [place], [place] [address]    Report Status 03/13/2024 FINAL  Final  Culture, blood  (Routine x 2)     Status: None   Collection Time: 03/07/24 10:29 PM   Specimen: BLOOD LEFT HAND  Result Value Ref Range Status   Specimen Description   Final    BLOOD LEFT HAND Performed at [facility], [address], [place], [place] [address]    Special Requests   Final    BOTTLES DRAWN AEROBIC AND ANAEROBIC Blood Culture adequate volume Performed at [facility], [address], [place], [place] [address]    Culture   Final    NO GROWTH 5 DAYS Performed at [facility], [address], [place], [place] [address]    Report Status 03/13/2024 FINAL  Final  MRSA Next Gen by PCR, Nasal     Status: None   Collection Time: 03/08/24  8:54 PM   Specimen: Nasal Mucosa; Nasal Swab  Result Value Ref Range Status   MRSA by PCR Next Gen NOT DETECTED NOT DETECTED Final    Comment: (NOTE) The GeneXpert MRSA Assay (FDA approved for NASAL specimens only), is one component of a comprehensive MRSA colonization surveillance program. It is not intended to diagnose MRSA infection nor to guide or monitor treatment for MRSA infections. Test performance is not FDA approved in patients less than 64 years old. Performed at [facility], [address], [place], [place] [address]   MRSA Next Gen by PCR, Nasal     Status: None   Collection Time: 03/10/24  8:14 AM   Specimen: Nasal Mucosa; Nasal Swab  Result Value Ref Range Status   MRSA by PCR Next Gen NOT DETECTED NOT DETECTED Final    Comment: (NOTE) The GeneXpert MRSA Assay (FDA approved for NASAL specimens only), is one component of a comprehensive MRSA colonization surveillance program. It is not intended to diagnose MRSA infection nor to guide or monitor treatment for MRSA infections. Test performance is not FDA approved in patients less than 32 years old. Performed at [facility], [address], [place], [place] [address]   Aerobic/Anaerobic Culture w Gram Stain  (surgical/deep wound)     Status: None (Preliminary result)   Collection Time: 03/10/24 11:19 AM   Specimen: Soft Tissue, Other  Result Value Ref Range Status   Specimen Description  TISSUE  Final   Special Requests LEFT CALF WOUND  Final   Gram Stain   Final    FEW WBC PRESENT, PREDOMINANTLY PMN RARE GRAM NEGATIVE RODS Performed at [facility], [address], [place], [place] [address]    Culture   Final    FEW ENTEROBACTER CLOACAE NO ANAEROBES ISOLATED; CULTURE IN PROGRESS FOR 5 DAYS    Report Status PENDING  Incomplete   Organism ID, Bacteria ENTEROBACTER CLOACAE  Final      Susceptibility   Enterobacter cloacae - MIC*    CEFEPIME  0.5 SENSITIVE Sensitive     CEFTAZIDIME >=64 RESISTANT Resistant     CIPROFLOXACIN <=0.25 SENSITIVE Sensitive     GENTAMICIN <=1 SENSITIVE Sensitive     IMIPENEM 1 SENSITIVE Sensitive     TRIMETH/SULFA <=20 SENSITIVE Sensitive     PIP/TAZO >=128 RESISTANT Resistant ug/mL    * FEW ENTEROBACTER CLOACAE         Radiology Studies: US  EKG SITE RITE Result Date: 03/13/2024 If Site Rite image not attached, placement could not be confirmed due to current cardiac rhythm.  US  EKG SITE RITE Result Date: 03/13/2024 If Site Rite image not attached, placement could not be confirmed due to current cardiac rhythm.    Scheduled Meds:  acetaminophen   1,000 mg Oral Q8H   docusate sodium   100 mg Oral BID   empagliflozin   25 mg Oral Daily   enoxaparin  (LOVENOX ) injection  70 mg Subcutaneous Daily   insulin  aspart  0-9 Units Subcutaneous Q4H   lamoTRIgine   250 mg Oral Daily   pantoprazole   40 mg Oral QAC breakfast   polyethylene glycol  17 g Oral BID   rosuvastatin   10 mg Oral Daily   senna-docusate  2 tablet Oral QHS   sodium chloride  flush  3-10 mL Intravenous Q12H   Continuous Infusions:  ceFEPime  (MAXIPIME ) IV       LOS: 6 days    Time spent: 36 min Barbee Lew, MD  03/13/2024, 11:12 AM

## 2024-03-13 NOTE — Anesthesia Postprocedure Evaluation (Signed)
 Anesthesia Post Note  Patient: Joshua Conner  Procedure(s) Performed: LEFT CALF DEBRIDEMENT (Left: Leg Lower) APPLICATION, WOUND VAC (Left: Leg Lower)     Patient location during evaluation: PACU Anesthesia Type: General Level of consciousness: sedated and patient cooperative Pain management: pain level controlled Vital Signs Assessment: post-procedure vital signs reviewed and stable Respiratory status: spontaneous breathing Cardiovascular status: stable Anesthetic complications: no   No notable events documented.  Last Vitals:  Vitals:   03/13/24 0826 03/13/24 1552  BP: 135/85 105/80  Pulse: 78 74  Resp: 19 16  Temp: 36.4 C 36.7 C  SpO2: 98% 95%    Last Pain:  Vitals:   03/13/24 1300  TempSrc:   PainSc: 3                  Gorman Laughter

## 2024-03-13 NOTE — Progress Notes (Signed)
 Patient ID: Joshua Conner, male   DOB: Mar 18, 1971, 53 y.o.   MRN: [id number] Patient seen in follow-up status post chronic tibial wound with exposed bone and osteomyelitis.  Cultures have not been finalized.  Currently 75 cc in the wound VAC canister.  Plan to return to the operating room on Wednesday for repeat debridement and tissue grafting.  Discussed with patient we would plan on discharging with approximately 6 weeks of IV antibiotics.

## 2024-03-13 NOTE — Plan of Care (Signed)
  Problem: Metabolic: Goal: Ability to maintain appropriate glucose levels will improve Outcome: Progressing   Problem: Nutritional: Goal: Maintenance of adequate nutrition will improve Outcome: Progressing   Problem: Skin Integrity: Goal: Risk for impaired skin integrity will decrease Outcome: Progressing   

## 2024-03-13 NOTE — Progress Notes (Signed)
 Peripherally Inserted Central Catheter Placement  The IV Nurse has discussed with the patient and/or persons authorized to consent for the patient, the purpose of this procedure and the potential benefits and risks involved with this procedure.  The benefits include less needle sticks, lab draws from the catheter, and the patient may be discharged home with the catheter. Risks include, but not limited to, infection, bleeding, blood clot (thrombus formation), and puncture of an artery; nerve damage and irregular heartbeat and possibility to perform a PICC exchange if needed/ordered by physician.  Alternatives to this procedure were also discussed.  Bard Power PICC patient education guide, fact sheet on infection prevention and patient information card has been provided to patient /or left at bedside.    PICC Placement Documentation  PICC Single Lumen 03/13/24 Right Basilic 48 cm 0 cm (Active)  Indication for Insertion or Continuance of Line Prolonged intravenous therapies 03/13/24 1302  Exposed Catheter (cm) 0 cm 03/13/24 1302  Site Assessment Clean, Dry, Intact 03/13/24 1302  Line Status Flushed;Blood return noted;Saline locked 03/13/24 1302  Dressing Type Transparent 03/13/24 1302  Dressing Status Antimicrobial disc/dressing in place 03/13/24 1302  Line Care Connections checked and tightened 03/13/24 1302  Line Adjustment (NICU/IV Team Only) No 03/13/24 1302  Dressing Intervention New dressing 03/13/24 1302  Dressing Change Due 03/20/24 03/13/24 1302       Joshua Conner 03/13/2024, 1:03 PM

## 2024-03-13 NOTE — Progress Notes (Signed)
 Mobility Specialist Progress Note:    03/13/24 1100  Mobility  Activity Ambulated independently in hallway  Level of Assistance Modified independent, requires aide device or extra time  Assistive Device None  Distance Ambulated (ft) 300 ft  LLE Weight Bearing Per Provider Order WBAT  Activity Response Tolerated well  Mobility Referral Yes  Mobility visit 1 Mobility  Mobility Specialist Start Time (ACUTE ONLY) 1031  Mobility Specialist Stop Time (ACUTE ONLY) 1036  Mobility Specialist Time Calculation (min) (ACUTE ONLY) 5 min   Received pt in chair having no complaints and agreeable to mobility. Pt was asymptomatic throughout ambulation and returned to room w/o fault. Left in chair w/ call bell in reach and all needs met.   D'Vante Nolon Baxter Mobility Specialist Please contact via Special educational needs teacher or Rehab office at [phone]

## 2024-03-13 NOTE — Progress Notes (Signed)
 Brief ID progress note:  Susceptibilities from operating room noted with enterocbacter cloacea sensitive to cefepime . Will preserve carbapenem use and place on cefepime .  Order picc line if not already in place for  anticipated six weeks of antibiotics to treat osteomyelitis. We could convert to FQ or Bactrim after a few weeks. He has plans to return to the operating room on Wednesday of this week to ensure we have surgical control of infection.  Will follow up Gibson Kurtz, NP

## 2024-03-13 NOTE — Plan of Care (Signed)

## 2024-03-14 DIAGNOSIS — L03116 Cellulitis of left lower limb: Secondary | ICD-10-CM | POA: Diagnosis not present

## 2024-03-14 DIAGNOSIS — M8618 Other acute osteomyelitis, other site: Secondary | ICD-10-CM

## 2024-03-14 DIAGNOSIS — M869 Osteomyelitis, unspecified: Secondary | ICD-10-CM

## 2024-03-14 LAB — GLUCOSE, CAPILLARY
Glucose-Capillary: 110 mg/dL — ABNORMAL HIGH (ref 70–99)
Glucose-Capillary: 168 mg/dL — ABNORMAL HIGH (ref 70–99)
Glucose-Capillary: 120 mg/dL — ABNORMAL HIGH (ref 70–99)
Glucose-Capillary: 173 mg/dL — ABNORMAL HIGH (ref 70–99)

## 2024-03-14 LAB — CBC
HCT: 34.7 % — ABNORMAL LOW (ref 39.0–52.0)
Hemoglobin: 11.1 g/dL — ABNORMAL LOW (ref 13.0–17.0)
MCH: 29.1 pg (ref 26.0–34.0)
MCHC: 32 g/dL (ref 30.0–36.0)
MCV: 90.8 fL (ref 80.0–100.0)
Platelets: 311 10*3/uL (ref 150–400)
RBC: 3.82 MIL/uL — ABNORMAL LOW (ref 4.22–5.81)
RDW: 13.8 % (ref 11.5–15.5)
WBC: 6.1 10*3/uL (ref 4.0–10.5)
nRBC: 0 % (ref 0.0–0.2)

## 2024-03-14 LAB — BASIC METABOLIC PANEL WITH GFR
Anion gap: 8 (ref 5–15)
BUN: 18 mg/dL (ref 6–20)
CO2: 30 mmol/L (ref 22–32)
Calcium: 8.8 mg/dL — ABNORMAL LOW (ref 8.9–10.3)
Chloride: 99 mmol/L (ref 98–111)
Creatinine, Ser: 1.56 mg/dL — ABNORMAL HIGH (ref 0.61–1.24)
GFR, Estimated: 53 mL/min — ABNORMAL LOW (ref >60)
Glucose, Bld: 132 mg/dL — ABNORMAL HIGH (ref 70–99)
Potassium: 3.8 mmol/L (ref 3.5–5.1)
Sodium: 137 mmol/L (ref 135–145)

## 2024-03-14 NOTE — Progress Notes (Signed)
 PHARMACY CONSULT NOTE FOR:  OUTPATIENT  PARENTERAL ANTIBIOTIC THERAPY (OPAT)  Indication: Left Leg Osteomyelitis  Regimen: Cefepime  2 gm IV Q 8 hours  End date: 04/26/24  IV antibiotic discharge orders are pended. To discharging provider:  please sign these orders via discharge navigator,  Select New Orders & click on the button choice - Manage This Unsigned Work.     Thank you for allowing pharmacy to be a part of this patient's care.  Denson Flake, PharmD, BCPS, BCIDP Infectious Diseases Clinical Pharmacist Phone: [phone] 03/14/2024, 11:46 AM

## 2024-03-14 NOTE — Anesthesia Preprocedure Evaluation (Addendum)
 Anesthesia Evaluation  Patient identified by MRN, date of birth, ID band Patient awake    Reviewed: Allergy & Precautions, NPO status , Patient's Chart, lab work & pertinent test results  Airway Mallampati: III  TM Distance: >3 FB Neck ROM: Full    Dental  (+) Dental Advisory Given, Chipped,    Pulmonary sleep apnea    Pulmonary exam normal breath sounds clear to auscultation       Cardiovascular Normal cardiovascular exam Rhythm:Regular Rate:Normal  TTE 2024 There is mild concentric left ventricular hypertrophy. LV ejection fraction = 55-60%. The ascending aorta measures 4 cm  1.5 cm-m2 , the aortic sinuses measures 4.2cm  1.6 cm-m2 . There is no significant valvular stenosis or regurgitation. There is no comparison study available.     Neuro/Psych Seizures -,   negative psych ROS   GI/Hepatic Neg liver ROS,GERD  ,,  Endo/Other  diabetes, Type 2, Oral Hypoglycemic Agents  Class 3 obesity (BMI 41)  Renal/GU Renal InsufficiencyRenal disease  negative genitourinary   Musculoskeletal negative musculoskeletal ROS (+)    Abdominal   Peds  Hematology negative hematology ROS (+)   Anesthesia Other Findings   Reproductive/Obstetrics                             Anesthesia Physical Anesthesia Plan  ASA: 3  Anesthesia Plan: General   Post-op Pain Management: Tylenol  PO (pre-op)* and Dilaudid  IV   Induction: Intravenous  PONV Risk Score and Plan: 2 and Midazolam , Dexamethasone  and Ondansetron   Airway Management Planned: LMA  Additional Equipment:   Intra-op Plan:   Post-operative Plan: Extubation in OR  Informed Consent: I have reviewed the patients History and Physical, chart, labs and discussed the procedure including the risks, benefits and alternatives for the proposed anesthesia with the patient or authorized representative who has indicated his/her understanding and acceptance.      Dental advisory given  Plan Discussed with: CRNA  Anesthesia Plan Comments:        Anesthesia Quick Evaluation

## 2024-03-14 NOTE — Progress Notes (Signed)
 Patient ID: Joshua Conner, male   DOB: [birth year], 53 y.o.   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  Cultures are finalized that Enterobacter cloacae.  Wound VAC is at 100 cc.  Plan for return to the operating room on Wednesday for repeat debridement and tissue graft.  Anticipate patient can discharge to home once the drainage is less than 50 cc a day and discharge on IV antibiotics per infectious disease.

## 2024-03-14 NOTE — Progress Notes (Signed)
 [facility]    Date of Admission:  03/07/2024     ID: Joshua Conner is a 53 y.o. 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)    Subjective: To have I&D tomorrow for final look Doing well No complaint  No n/v/diarrhea  Wbc at 8 down from 14 ( 2 days ago) Medications:   acetaminophen   1,000 mg Oral Q8H   Chlorhexidine  Gluconate Cloth  6 each Topical Daily   docusate sodium   100 mg Oral BID   empagliflozin   25 mg Oral Daily   enoxaparin  (LOVENOX ) injection  70 mg Subcutaneous Daily   insulin  aspart  0-9 Units Subcutaneous TID WC   lamoTRIgine   250 mg Oral Daily   pantoprazole   40 mg Oral QAC breakfast   polyethylene glycol  17 g Oral BID   rosuvastatin   10 mg Oral Daily   senna-docusate  2 tablet Oral QHS   sodium chloride  flush  10-40 mL Intracatheter Q12H    Objective: Vital signs in last 24 hours: Temp:  [97.7 F (36.5 C)-98 F (36.7 C)] 97.7 F (36.5 C) (05/20 0445) Pulse Rate:  [70-87] 70 (05/20 0744) Resp:  [16-18] 18 (05/20 0744) BP: (110-118)/(83-96) 110/83 (05/20 0744) SpO2:  [95 %-96 %] 96 % (05/20 0744)  Physical Exam  General/constitutional: no distress, pleasant HEENT: Normocephalic, PER, Conj Clear, EOMI, Oropharynx clear Neck supple CV: rrr no mrg Lungs: clear to auscultation, normal respiratory effort Abd: Soft, Nontender  Skin/msk: Skin is warm and dry. No rash noted. No erythema. Left calf is wrapped with wound vac in  place   Lab Results Recent Labs    03/13/24 0733 03/14/24 0111  WBC 5.2 6.1  HGB 11.9* 11.1*  HCT 36.9* 34.7*  NA 136 137  K 3.8 3.8  CL 100 99  CO2 27 30  BUN 19 18  CREATININE 1.35* 1.56*   Liver Panel No results for input(s): "PROT", "ALBUMIN", "AST", "ALT", "ALKPHOS", "BILITOT", "BILIDIR", "IBILI" in the last 72 hours.   Microbiology: Blood cx on 5/13 ngtd Or cx from 5/16 pending Studies/Results: US  EKG SITE RITE Result Date: 03/13/2024 If Site Rite image not attached, placement could not be confirmed due to current cardiac rhythm.  US  EKG SITE RITE Result Date: 03/13/2024 If Site Rite image not attached, placement could not be confirmed due to current cardiac rhythm.    Assessment/Plan: 53yo m with large left calf ulcer with cellulitis and abscess in subcutaneous tissue per mri s/p I x D and wound vac placement on 5/15 =  Debridement to bone and concern for om  Culture enterobacter cloacae (S cipro/bactrim)  Plan 6 weeks cefepime       OPAT Orders Discharge antibiotics to be given via PICC line Discharge antibiotics: Cefepime   Duration: 6 weeks End Date: 04/26/24  [facility] Care Per Protocol:  Home health RN for IV administration and teaching; PICC line care and labs.    Labs weekly while  on IV antibiotics: _x_ CBC with differential __ BMP _x_ CMP _x_ CRP _x_ ESR __ Vancomycin  trough __ CK  _x_ Please pull PIC at completion of IV antibiotics __ Please leave PIC in place until doctor has seen patient or been notified  Fax weekly labs to [phone]  Clinic Follow Up Appt: 03/28/24 @ 1015  @  [facility] [address], [place], [place] [address] Phone: [phone]    Martie Slaughter [facility] Pager: [phone]  03/14/2024, 5:12 PM

## 2024-03-14 NOTE — Plan of Care (Signed)
  Problem: Metabolic: Goal: Ability to maintain appropriate glucose levels will improve Outcome: Progressing   Problem: Skin Integrity: Goal: Risk for impaired skin integrity will decrease Outcome: Progressing   Problem: Health Behavior/Discharge Planning: Goal: Ability to manage health-related needs will improve Outcome: Progressing

## 2024-03-14 NOTE — Progress Notes (Signed)
 PROGRESS NOTE    Joshua Conner  ZOX:[id number] DOB: 05-22-1971 DOA: 03/07/2024 PCP: Jimmey Mould, MD   Brief Narrative: This is a 53 y.o. male with PMH significant for DM2, HTN, HLD, morbid obesity, OSA on CPAP, seizure disorder, GERD, meningioma, varicose veins s/p ablation. On 02/03/24, while at work, piece of quartz fell on his left leg and sustained traumatic laceration.  The wound got infected eventually.  Since then, patient has had multiple courses of antibiotics, evaluation of the wound care center and also hospitalization for IV antibiotics in April. 5/12, 1 day prior to presentation, seen at wound care center. Underwent debridement. 5/13, patient noticed increasing redness around the wound and purulent drainage from the wound itself.  Later in the day, he had a scheduled appointment with ID but prior to their appointment, patient presented to the ED with foul-smelling drainage, subjective fever, nausea, vomiting and a concern of recurrent infection of chronic wound.  Patient was tachycardic with leukocytosis in the ED.  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 Julio Ohm 03/10/2024.   Patient failed outpatient debridement as well as multiple courses of inpatient and outpatient antibiotics.   Met criteria for sepsis on admission with leukocytosis tachycardia lactic acidosis.   He was afebrile on admission.   X-ray of the left tibia-fibula at the time of admission  was negative for osteomyelitis.  Since patient continued to complain of pain a MRI of the left lower extremity was done which was concerning for abscess.   He was seen by Dr. Lucienne Ryder who transferred patient to [facility] to be seen by Dr. Julio Ohm and further debridement.   He was initially on Levaquin  and Zyvox  and this is being switched to cefepime  and vancomycin  on May 16.   ID following  They have recommended a PICC line and 6 weeks of cefepime .  Surgical culture growing enterococcal cloacae, sensitive to cefepime . For repeat surgical debridement on Wednesday 5/21 N.p.o. after midnight   Type 2 diabetes mellitus A1c 6.3 from April 2025 PTA meds-Jardiance  25 mg daily. Currently on SSI/Accu-Cheks.  Resume Jardiance  Last Labs         Recent Labs  Lab 03/08/24 2028 03/08/24 2307 03/09/24 0459 03/09/24 0716 03/09/24 1228  GLUCAP 128* 119* 115* 120* 123*      HLD Crestor    H/o seizure disorder Continue Lamictal    Morbid Obesity  Body mass index is 40.99 kg/m. Patient has been advised to make an attempt to improve diet and exercise patterns to aid in weight loss.   OSA On CPAP   GERD PPI   H/o varicose vein s/p ablation  several years ago on both legs    Estimated body mass index is 40.99 kg/m as calculated from the following:   Height as of this encounter: 6' (1.829 m).   Weight as of this encounter: 137.1 kg.  DVT prophylaxis: LOVENOX  Code Status:full Family Communication: none Disposition Plan:  Status is: Inpatient Remains inpatient  appropriate because: acute illness   Consultants:  ID DR DUDA  Procedures: EXCISIONAL DEBRIDEMENT LLE 5/16 Antimicrobials: CEFEPIME   Subjective: No complaints of nausea vomiting diarrhea slept well or tomorrow and go home soon  Objective: Vitals:   03/13/24 1552 03/13/24 2004 03/14/24 0445 03/14/24 0744  BP: 105/80 118/86 (!) 112/96 110/83  Pulse: 74 87 82 70  Resp: 16 16 16 18   Temp: 98.1 F (36.7 C) 98 F (36.7 C) 97.7 F  (36.5 C)   TempSrc:      SpO2: 95% 96% 95% 96%  Weight:      Height:        Intake/Output Summary (Last 24 hours) at 03/14/2024 1336 Last data filed at 03/13/2024 1503 Gross per 24 hour  Intake 21.81 ml  Output --  Net 21.81 ml   Filed Weights   03/08/24 0006 03/10/24 0955  Weight: (!) 137.1 kg (!) 137.1 kg    Examination:  General exam: Appears in no acute distress  respiratory system: Clear to auscultation. Respiratory effort normal. Cardiovascular system: Regular rate and rhythm gastrointestinal system: Abdomen is distended, soft and nontender. No organomegaly or masses felt. Normal bowel sounds heard. Central nervous system: Alert and oriented. No focal neurological deficits. Extremities: Left lower extremity covered with a bandage   Data Reviewed: I have personally reviewed following labs and imaging studies  CBC: Recent Labs  Lab 03/07/24 2224 03/08/24 0554 03/09/24 0553 03/13/24 0733 03/14/24 0111  WBC 14.1* 9.5 7.9 5.2 6.1  NEUTROABS 11.6*  --  5.6  --   --   HGB 14.1 11.7* 13.3 11.9* 11.1*  HCT 43.3 36.8* 41.2 36.9* 34.7*  MCV 91.4 93.2 92.4 90.0 90.8  PLT 271 207 220 314 311   Basic Metabolic Panel: Recent Labs  Lab 03/08/24 0554 03/09/24 0553 03/11/24 0454 03/12/24 1005 03/13/24 0733 03/14/24 0111  NA 140 136 133* 136 136 137  K 3.9 4.3 4.2 3.9 3.8 3.8  CL 109 103 101 101 100 99  CO2 24 24 24 27 27 30   GLUCOSE 131* 106* 195* 161* 97 132*  BUN 18 14 25* 18 19 18   CREATININE 1.12 1.19 1.49* 1.40* 1.35* 1.56*  CALCIUM  8.3* 9.1 8.5* 8.6* 9.0 8.8*  MG 2.0  --   --   --   --   --   PHOS 4.0  --   --   --   --   --    GFR: Estimated Creatinine Clearance: 79.4 mL/min (A) (by C-G formula based on SCr of 1.56 mg/dL (H)). Liver Function Tests: Recent Labs  Lab 03/07/24 2224 03/08/24 0554  AST 15 11*  ALT 30 21  ALKPHOS 67 50  BILITOT 0.9 0.7  PROT 7.1 5.8*  ALBUMIN 4.0 3.3*   No results for input(s): "LIPASE", "AMYLASE" in the last 168  hours. No results for input(s): "AMMONIA" in the last 168 hours. Coagulation Profile: Recent Labs  Lab 03/07/24 2234  INR 1.0   Cardiac Enzymes: No results for input(s): "CKTOTAL", "CKMB", "CKMBINDEX", "TROPONINI" in the last 168 hours. BNP (last 3 results) No results for input(s): "PROBNP" in the last 8760 hours. HbA1C: No results for input(s): "HGBA1C" in the last 72 hours.  CBG: Recent Labs  Lab 03/13/24 1147 03/13/24 1554 03/13/24 2109 03/14/24 0635 03/14/24 1136  GLUCAP 183* 122* 174* 110* 168*   Lipid Profile: No results for input(s): "CHOL", "HDL", "LDLCALC", "TRIG", "CHOLHDL", "LDLDIRECT" in the last 72 hours. Thyroid Function Tests: No results for input(s): "TSH", "T4TOTAL", "FREET4", "  T3FREE", "THYROIDAB" in the last 72 hours. Anemia Panel: No results for input(s): "VITAMINB12", "FOLATE", "FERRITIN", "TIBC", "IRON", "RETICCTPCT" in the last 72 hours. Sepsis Labs: Recent Labs  Lab 03/07/24 2233  LATICACIDVEN 1.4    Recent Results (from the past 240 hours)  Culture, blood (Routine x 2)     Status: None   Collection Time: 03/07/24 10:24 PM   Specimen: BLOOD  Result Value Ref Range Status   Specimen Description   Final    BLOOD SITE NOT SPECIFIED Performed at [facility], [address], [place], [place] [address]    Special Requests   Final    BOTTLES DRAWN AEROBIC AND ANAEROBIC Blood Culture adequate volume Performed at [facility], [address] [place], [place] [address]    Culture   Final    NO GROWTH 5 DAYS Performed at [facility], [address], [place], [place] [address]    Report Status 03/13/2024 FINAL  Final  Culture, blood (Routine x 2)     Status: None   Collection Time: 03/07/24 10:29 PM   Specimen: BLOOD LEFT HAND  Result Value Ref Range Status   Specimen Description   Final    BLOOD LEFT HAND Performed at [facility], [address], [place], [place] [address]     Special Requests   Final    BOTTLES DRAWN AEROBIC AND ANAEROBIC Blood Culture adequate volume Performed at [facility], [address] [place], [place] [address]    Culture   Final    NO GROWTH 5 DAYS Performed at [facility], [address] [place], [place] [address]    Report Status 03/13/2024 FINAL  Final  MRSA Next Gen by PCR, Nasal     Status: None   Collection Time: 03/08/24  8:54 PM   Specimen: Nasal Mucosa; Nasal Swab  Result Value Ref Range Status   MRSA by PCR Next Gen NOT DETECTED NOT DETECTED Final    Comment: (NOTE) The GeneXpert MRSA Assay (FDA approved for NASAL specimens only), is one component of a comprehensive MRSA colonization surveillance program. It is not intended to diagnose MRSA infection nor to guide or monitor treatment for MRSA infections. Test performance is not FDA approved in patients less than 23 years old. Performed at [facility], [address] [place], [place] [address]   MRSA Next Gen by PCR, Nasal     Status: None   Collection Time: 03/10/24  8:14 AM   Specimen: Nasal Mucosa; Nasal Swab  Result Value Ref Range Status   MRSA by PCR Next Gen NOT DETECTED NOT DETECTED Final    Comment: (NOTE) The GeneXpert MRSA Assay (FDA approved for NASAL specimens only), is one component of a comprehensive MRSA colonization surveillance program. It is not intended to diagnose MRSA infection nor to guide or monitor treatment for MRSA infections. Test performance is not FDA approved in patients less than 101 years old. Performed at [facility], [address], [place], [place] [address]   Aerobic/Anaerobic Culture w Gram Stain (surgical/deep wound)     Status: None (Preliminary result)   Collection Time: 03/10/24 11:19 AM   Specimen: Soft Tissue, Other  Result Value Ref Range Status   Specimen Description TISSUE  Final   Special Requests LEFT CALF WOUND  Final   Gram Stain   Final    FEW WBC  PRESENT, PREDOMINANTLY PMN RARE GRAM NEGATIVE RODS Performed at  [facility], [address], [place], [place] [address]    Culture   Final    FEW ENTEROBACTER CLOACAE NO ANAEROBES ISOLATED; CULTURE IN PROGRESS FOR 5 DAYS    Report Status PENDING  Incomplete   Organism ID, Bacteria ENTEROBACTER CLOACAE  Final      Susceptibility   Enterobacter cloacae - MIC*    CEFEPIME  0.5 SENSITIVE Sensitive     CEFTAZIDIME >=64 RESISTANT Resistant     CIPROFLOXACIN <=0.25 SENSITIVE Sensitive     GENTAMICIN <=1 SENSITIVE Sensitive     IMIPENEM 1 SENSITIVE Sensitive     TRIMETH/SULFA <=20 SENSITIVE Sensitive     PIP/TAZO >=128 RESISTANT Resistant ug/mL    * FEW ENTEROBACTER CLOACAE         Radiology Studies: US  EKG SITE RITE Result Date: 03/13/2024 If Site Rite image not attached, placement could not be confirmed due to current cardiac rhythm.  US  EKG SITE RITE Result Date: 03/13/2024 If Site Rite image not attached, placement could not be confirmed due to current cardiac rhythm.    Scheduled Meds:  acetaminophen   1,000 mg Oral Q8H   Chlorhexidine  Gluconate Cloth  6 each Topical Daily   docusate sodium   100 mg Oral BID   empagliflozin   25 mg Oral Daily   enoxaparin  (LOVENOX ) injection  70 mg Subcutaneous Daily   insulin  aspart  0-9 Units Subcutaneous TID WC   lamoTRIgine   250 mg Oral Daily   pantoprazole   40 mg Oral QAC breakfast   polyethylene glycol  17 g Oral BID   rosuvastatin   10 mg Oral Daily   senna-docusate  2 tablet Oral QHS   sodium chloride  flush  10-40 mL Intracatheter Q12H   Continuous Infusions:  ceFEPime  (MAXIPIME ) IV 2 g (03/14/24 0508)     LOS: 7 days    Time spent: 36 min Barbee Lew, MD  03/14/2024, 1:36 PM

## 2024-03-14 NOTE — H&P (View-Only) (Signed)
 Patient ID: Joshua Conner, male   DOB: [birth year], 53 y.o.   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  Cultures are finalized that Enterobacter cloacae.  Wound VAC is at 100 cc.  Plan for return to the operating room on Wednesday for repeat debridement and tissue graft.  Anticipate patient can discharge to home once the drainage is less than 50 cc a day and discharge on IV antibiotics per infectious disease.

## 2024-03-15 ENCOUNTER — Inpatient Hospital Stay (HOSPITAL_COMMUNITY): Admitting: Anesthesiology

## 2024-03-15 ENCOUNTER — Other Ambulatory Visit: Payer: Self-pay

## 2024-03-15 ENCOUNTER — Encounter (HOSPITAL_COMMUNITY)
Admission: EM | Disposition: A | Discharge status: Home Health | Payer: Self-pay | Source: Home / Self Care | Attending: Internal Medicine

## 2024-03-15 ENCOUNTER — Encounter (HOSPITAL_COMMUNITY): Payer: Self-pay | Admitting: Internal Medicine

## 2024-03-15 DIAGNOSIS — L97924 Non-pressure chronic ulcer of unspecified part of left lower leg with necrosis of bone: Secondary | ICD-10-CM | POA: Diagnosis not present

## 2024-03-15 DIAGNOSIS — Z7984 Long term (current) use of oral hypoglycemic drugs: Secondary | ICD-10-CM

## 2024-03-15 DIAGNOSIS — M869 Osteomyelitis, unspecified: Secondary | ICD-10-CM

## 2024-03-15 DIAGNOSIS — N1831 Chronic kidney disease, stage 3a: Secondary | ICD-10-CM | POA: Diagnosis not present

## 2024-03-15 DIAGNOSIS — E119 Type 2 diabetes mellitus without complications: Secondary | ICD-10-CM | POA: Diagnosis not present

## 2024-03-15 DIAGNOSIS — E1169 Type 2 diabetes mellitus with other specified complication: Secondary | ICD-10-CM | POA: Diagnosis not present

## 2024-03-15 DIAGNOSIS — L03116 Cellulitis of left lower limb: Secondary | ICD-10-CM | POA: Diagnosis not present

## 2024-03-15 HISTORY — PX: INCISION AND DRAINAGE OF DEEP ABSCESS, CALF: SHX7361

## 2024-03-15 LAB — BASIC METABOLIC PANEL WITH GFR
Anion gap: 9 (ref 5–15)
BUN: 19 mg/dL (ref 6–20)
CO2: 29 mmol/L (ref 22–32)
Calcium: 9.1 mg/dL (ref 8.9–10.3)
Chloride: 100 mmol/L (ref 98–111)
Creatinine, Ser: 1.42 mg/dL — ABNORMAL HIGH (ref 0.61–1.24)
GFR, Estimated: 59 mL/min — ABNORMAL LOW (ref >60)
Glucose, Bld: 124 mg/dL — ABNORMAL HIGH (ref 70–99)
Potassium: 4.1 mmol/L (ref 3.5–5.1)
Sodium: 138 mmol/L (ref 135–145)

## 2024-03-15 LAB — CBC
HCT: 35.6 % — ABNORMAL LOW (ref 39.0–52.0)
Hemoglobin: 11.7 g/dL — ABNORMAL LOW (ref 13.0–17.0)
MCH: 29.7 pg (ref 26.0–34.0)
MCHC: 32.9 g/dL (ref 30.0–36.0)
MCV: 90.4 fL (ref 80.0–100.0)
Platelets: 314 10*3/uL (ref 150–400)
RBC: 3.94 MIL/uL — ABNORMAL LOW (ref 4.22–5.81)
RDW: 13.7 % (ref 11.5–15.5)
WBC: 5.8 10*3/uL (ref 4.0–10.5)
nRBC: 0 % (ref 0.0–0.2)

## 2024-03-15 LAB — GLUCOSE, CAPILLARY
Glucose-Capillary: 115 mg/dL — ABNORMAL HIGH (ref 70–99)
Glucose-Capillary: 111 mg/dL — ABNORMAL HIGH (ref 70–99)
Glucose-Capillary: 111 mg/dL — ABNORMAL HIGH (ref 70–99)
Glucose-Capillary: 199 mg/dL — ABNORMAL HIGH (ref 70–99)
Glucose-Capillary: 195 mg/dL — ABNORMAL HIGH (ref 70–99)

## 2024-03-15 LAB — AEROBIC/ANAEROBIC CULTURE W GRAM STAIN (SURGICAL/DEEP WOUND)

## 2024-03-15 SURGERY — INCISION AND DRAINAGE OF DEEP ABSCESS, CALF
Anesthesia: General | Site: Leg Lower | Laterality: Left

## 2024-03-15 MED ORDER — MIDAZOLAM HCL 2 MG/2ML IJ SOLN
INTRAMUSCULAR | Status: DC | PRN
  Administered 2024-03-15: 2 mg via INTRAVENOUS

## 2024-03-15 MED ORDER — OXYCODONE HCL 5 MG/5ML PO SOLN: 5.0000 mg | Freq: Once | ORAL | Status: DC | PRN

## 2024-03-15 MED ORDER — ACETAMINOPHEN 500 MG PO TABS
1000.0000 mg | ORAL_TABLET | Freq: Once | ORAL | Status: AC
  Administered 2024-03-15: 1000 mg via ORAL

## 2024-03-15 MED ORDER — FENTANYL CITRATE (PF) 100 MCG/2ML IJ SOLN
INTRAMUSCULAR | Status: AC
Start: 2024-03-15 — End: ?
  Filled 2024-03-15: qty 2

## 2024-03-15 MED ORDER — LACTATED RINGERS IV SOLN: INTRAVENOUS | Status: DC

## 2024-03-15 MED ORDER — POVIDONE-IODINE 10 % EX SWAB
2.0000 | Freq: Once | CUTANEOUS | Status: AC
  Administered 2024-03-15: 2 via TOPICAL

## 2024-03-15 MED ORDER — HYDROMORPHONE HCL 1 MG/ML IJ SOLN
INTRAMUSCULAR | Status: AC
  Filled 2024-03-15: qty 1

## 2024-03-15 MED ORDER — OXYCODONE HCL 5 MG PO TABS: 5.0000 mg | ORAL_TABLET | Freq: Once | ORAL | Status: DC | PRN

## 2024-03-15 MED ORDER — FENTANYL CITRATE (PF) 100 MCG/2ML IJ SOLN
25.0000 ug | INTRAMUSCULAR | Status: DC | PRN
Start: 2024-03-15 — End: 2024-03-15
  Administered 2024-03-15 (×3): 50 ug via INTRAVENOUS

## 2024-03-15 MED ORDER — HYDROMORPHONE HCL 1 MG/ML IJ SOLN
0.2500 mg | INTRAMUSCULAR | Status: DC | PRN
  Administered 2024-03-15 (×4): 0.5 mg via INTRAVENOUS

## 2024-03-15 MED ORDER — FENTANYL CITRATE (PF) 100 MCG/2ML IJ SOLN
INTRAMUSCULAR | Status: AC
  Filled 2024-03-15: qty 2

## 2024-03-15 MED ORDER — CEFAZOLIN SODIUM-DEXTROSE 3-4 GM/150ML-% IV SOLN: 3.0000 g | INTRAVENOUS | Status: DC

## 2024-03-15 MED ORDER — CEFEPIME IV (FOR PTA / DISCHARGE USE ONLY): 2.0000 g | Freq: Three times a day (TID) | INTRAVENOUS | 0 refills | Status: AC

## 2024-03-15 MED ORDER — ONDANSETRON HCL 4 MG/2ML IJ SOLN
INTRAMUSCULAR | Status: DC | PRN
  Administered 2024-03-15: 4 mg via INTRAVENOUS

## 2024-03-15 MED ORDER — PROPOFOL 10 MG/ML IV BOLUS
INTRAVENOUS | Status: AC
  Filled 2024-03-15: qty 20

## 2024-03-15 MED ORDER — FENTANYL CITRATE (PF) 250 MCG/5ML IJ SOLN
INTRAMUSCULAR | Status: DC | PRN
Start: 2024-03-15 — End: 2024-03-15
  Administered 2024-03-15: 50 ug via INTRAVENOUS

## 2024-03-15 MED ORDER — PROPOFOL 10 MG/ML IV BOLUS
INTRAVENOUS | Status: DC | PRN
Start: 2024-03-15 — End: 2024-03-15
  Administered 2024-03-15: 200 mg via INTRAVENOUS

## 2024-03-15 MED ORDER — CHLORHEXIDINE GLUCONATE 0.12 % MT SOLN
OROMUCOSAL | Status: AC
Start: 2024-03-15 — End: 2024-03-15
  Administered 2024-03-15: 15 mL via OROMUCOSAL
  Filled 2024-03-15: qty 15

## 2024-03-15 MED ORDER — FENTANYL CITRATE (PF) 250 MCG/5ML IJ SOLN
INTRAMUSCULAR | Status: AC
  Filled 2024-03-15: qty 5

## 2024-03-15 MED ORDER — AMISULPRIDE (ANTIEMETIC) 5 MG/2ML IV SOLN: 10.0000 mg | Freq: Once | INTRAVENOUS | Status: DC | PRN

## 2024-03-15 MED ORDER — MIDAZOLAM HCL 2 MG/2ML IJ SOLN
INTRAMUSCULAR | Status: AC
  Filled 2024-03-15: qty 2

## 2024-03-15 MED ORDER — CEFAZOLIN SODIUM-DEXTROSE 3-4 GM/150ML-% IV SOLN
INTRAVENOUS | Status: AC
  Filled 2024-03-15: qty 150

## 2024-03-15 MED ORDER — ORAL CARE MOUTH RINSE: 15.0000 mL | Freq: Once | OROMUCOSAL | Status: AC

## 2024-03-15 MED ORDER — CHLORHEXIDINE GLUCONATE 0.12 % MT SOLN: 15.0000 mL | Freq: Once | OROMUCOSAL | Status: AC

## 2024-03-15 MED ORDER — LIDOCAINE 2% (20 MG/ML) 5 ML SYRINGE
INTRAMUSCULAR | Status: DC | PRN
  Administered 2024-03-15: 100 mg via INTRAVENOUS

## 2024-03-15 MED ORDER — CHLORHEXIDINE GLUCONATE 4 % EX SOLN: 60.0000 mL | Freq: Once | CUTANEOUS | Status: DC

## 2024-03-15 MED ORDER — DEXAMETHASONE SODIUM PHOSPHATE 10 MG/ML IJ SOLN
INTRAMUSCULAR | Status: DC | PRN
  Administered 2024-03-15: 4 mg via INTRAVENOUS

## 2024-03-15 MED ORDER — VASHE WOUND IRRIGATION OPTIME
TOPICAL | Status: DC | PRN
  Administered 2024-03-15: 34 [oz_av]

## 2024-03-15 SURGICAL SUPPLY — 37 items
BAG COUNTER SPONGE SURGICOUNT (BAG) IMPLANT
BLADE SURG 21 STRL SS (BLADE) ×1 IMPLANT
BNDG COHESIVE 4X5 TAN STRL LF (GAUZE/BANDAGES/DRESSINGS) IMPLANT
BNDG COHESIVE 6X5 TAN NS LF (GAUZE/BANDAGES/DRESSINGS) IMPLANT
BNDG COHESIVE 6X5 TAN ST LF (GAUZE/BANDAGES/DRESSINGS) IMPLANT
BNDG GAUZE DERMACEA FLUFF 4 (GAUZE/BANDAGES/DRESSINGS) IMPLANT
CANISTER WOUNDNEG PRESSURE 500 (CANNISTER) IMPLANT
CLEANSER WND VASHE INSTL 34OZ (WOUND CARE) IMPLANT
COVER SURGICAL LIGHT HANDLE (MISCELLANEOUS) ×2 IMPLANT
DRAPE DERMATAC (DRAPES) IMPLANT
DRAPE U-SHAPE 47X51 STRL (DRAPES) ×1 IMPLANT
DRESSING PREVENA PLUS CUSTOM (GAUZE/BANDAGES/DRESSINGS) IMPLANT
DRESSING VERAFLO CLEANS CC MED (GAUZE/BANDAGES/DRESSINGS) IMPLANT
DRSG ADAPTIC 3X8 NADH LF (GAUZE/BANDAGES/DRESSINGS) ×1 IMPLANT
DURAPREP 26ML APPLICATOR (WOUND CARE) ×1 IMPLANT
ELECTRODE REM PT RTRN 9FT ADLT (ELECTROSURGICAL) IMPLANT
GAUZE PAD ABD 8X10 STRL (GAUZE/BANDAGES/DRESSINGS) IMPLANT
GAUZE SPONGE 4X4 12PLY STRL (GAUZE/BANDAGES/DRESSINGS) IMPLANT
GLOVE BIOGEL PI IND STRL 9 (GLOVE) ×1 IMPLANT
GLOVE SURG ORTHO 9.0 STRL STRW (GLOVE) ×1 IMPLANT
GOWN STRL REUS W/ TWL XL LVL3 (GOWN DISPOSABLE) ×2 IMPLANT
GRAFT SKIN WND SURGICLOSE M95 (Tissue) IMPLANT
KIT BASIN OR (CUSTOM PROCEDURE TRAY) ×1 IMPLANT
KIT TURNOVER KIT B (KITS) ×1 IMPLANT
MANIFOLD NEPTUNE II (INSTRUMENTS) ×1 IMPLANT
NS IRRIG 1000ML POUR BTL (IV SOLUTION) ×1 IMPLANT
PACK ORTHO EXTREMITY (CUSTOM PROCEDURE TRAY) ×1 IMPLANT
PAD ARMBOARD POSITIONER FOAM (MISCELLANEOUS) ×2 IMPLANT
PAD NEG PRESSURE SENSATRAC (MISCELLANEOUS) IMPLANT
SET HNDPC FAN SPRY TIP SCT (DISPOSABLE) IMPLANT
STOCKINETTE IMPERVIOUS 9X36 MD (GAUZE/BANDAGES/DRESSINGS) IMPLANT
SUT ETHILON 2 0 PSLX (SUTURE) ×1 IMPLANT
SWAB COLLECTION DEVICE MRSA (MISCELLANEOUS) ×1 IMPLANT
SWAB CULTURE ESWAB REG 1ML (MISCELLANEOUS) IMPLANT
TOWEL GREEN STERILE (TOWEL DISPOSABLE) ×1 IMPLANT
TUBE CONNECTING 12X1/4 (SUCTIONS) ×1 IMPLANT
YANKAUER SUCT BULB TIP NO VENT (SUCTIONS) ×1 IMPLANT

## 2024-03-15 NOTE — Op Note (Signed)
 03/15/2024  12:14 PM  PATIENT:  Joshua Conner    PRE-OPERATIVE DIAGNOSIS:  Osteomyelitis Left Leg  POST-OPERATIVE DIAGNOSIS:  Same  PROCEDURE:  INCISION AND DRAINAGE OF DEEP ABSCESS, CALF Application Kerecis micro graft 95 cm to cover a wound surface area greater than 100 cm. Application cleanse choice wound VAC sponge.  SURGEON:  Timothy Ford, MD  PHYSICIAN ASSISTANT:None ANESTHESIA:   General  PREOPERATIVE INDICATIONS:  Joshua Conner is a  53 y.o. male with a diagnosis of Osteomyelitis Left Leg who failed conservative measures and elected for surgical management.    The risks benefits and alternatives were discussed with the patient preoperatively including but not limited to the risks of infection, bleeding, nerve injury, cardiopulmonary complications, the need for revision surgery, among others, and the patient was willing to proceed.  OPERATIVE IMPLANTS:   Implant Name Type Inv. Item Serial No. Manufacturer Lot No. LRB No. Used Action  GRAFT SKIN WND SURGICLOSE M95 - [id number] Tissue GRAFT SKIN WND SURGICLOSE M95  [employer] [id number] Left 1 Implanted    @ENCIMAGES @  OPERATIVE FINDINGS: Patient had improved granulation tissue.  The tibia had good petechial bleeding after debridement.  OPERATIVE PROCEDURE: Patient was brought the operating room underwent a general anesthetic.  After adequate level anesthesia were obtained patient's left lower extremity was prepped using DuraPrep draped into a sterile field a timeout was called.  A 21 blade knife was used to excise further skin and soft tissue muscle and fascia.  The bone was pie crusted to allow for petechial bleeding.  When tissue margins were healthy and viable the wound was cleansed with Vashe.  The wound was 20 x 10 cm and tissue margins were undermined.  The wound bed was filled with 95 cm of Kerecis micro graft to cover the wound surface area of 200 cm.  After undermining the wound edges 2-0 nylon was used  to provide local tissue transfer for wound closure 20 x 10 cm.  A small cleanse choice wound VAC sponge was applied this was secured with derma tack this had a good suction fit this was overwrapped with Coban patient was extubated taken the PACU in stable condition.   DISCHARGE PLANNING:  Antibiotic duration: Continue antibiotics through a PICC line  Weightbearing: Weightbearing as tolerated  Pain medication: Opioid pathway  Dressing care/ Wound VAC: Continue wound VAC for 1 week  Ambulatory devices: Walker or wheelchair  Discharge to: Home.  Follow-up: In the office 1 week post operative.

## 2024-03-15 NOTE — Anesthesia Postprocedure Evaluation (Signed)
 Anesthesia Post Note  Patient: Joshua Conner  Procedure(s) Performed: INCISION AND DRAINAGE OF DEEP ABSCESS, CALF (Left: Leg Lower)     Patient location during evaluation: PACU Anesthesia Type: General Level of consciousness: awake and alert Pain management: pain level controlled Vital Signs Assessment: post-procedure vital signs reviewed and stable Respiratory status: spontaneous breathing, nonlabored ventilation, respiratory function stable and patient connected to nasal cannula oxygen Cardiovascular status: blood pressure returned to baseline and stable Postop Assessment: no apparent nausea or vomiting Anesthetic complications: no  No notable events documented.  Last Vitals:  Vitals:   03/15/24 1315 03/15/24 1334  BP: 115/88 122/79  Pulse: 81 84  Resp: (!) 9 16  Temp: 36.7 C 36.7 C  SpO2: 94% 95%    Last Pain:  Vitals:   03/15/24 1346  TempSrc:   PainSc: 8                  Zully Frane L Kayal Mula

## 2024-03-15 NOTE — Progress Notes (Signed)
 PROGRESS NOTE    Joshua Conner  NUU:[id number] DOB: 10/21/1971 DOA: 03/07/2024 PCP: Jimmey Mould, MD   Brief Narrative: This is a 53 y.o. male with PMH significant for DM2, HTN, HLD, morbid obesity, OSA on CPAP, seizure disorder, GERD, meningioma, varicose veins s/p ablation. On 02/03/24, while at work, piece of quartz fell on his left leg and sustained traumatic laceration.  The wound got infected eventually.  Since then, patient has had multiple courses of antibiotics, evaluation of the wound care center and also hospitalization for IV antibiotics in April. 5/12, 1 day prior to presentation, seen at wound care center. Underwent debridement. 5/13, patient noticed increasing redness around the wound and purulent drainage from the wound itself.  Later in the day, he had a scheduled appointment with ID but prior to their appointment, patient presented to the ED with foul-smelling drainage, subjective fever, nausea, vomiting and a concern of recurrent infection of chronic wound.  Patient was tachycardic with leukocytosis in the ED.  Assessment & Plan:   Left leg abscess/sepsis POA X-ray of the left tibia-fibula at the time of admission was negative for osteomyelitis.  Since patient continued to complain of pain a MRI of the left lower extremity was done which was concerning for abscess. Status post excisional debridement and application of the wound VAC Dr. Julio Ohm 03/10/2024. Patient failed outpatient debridement as well as multiple courses of inpatient and outpatient antibiotics.   Met criteria for sepsis on admission with leukocytosis tachycardia lactic acidosis. Patient was seen by ID as well. Surgical culture growing enterococcal cloacae, sensitive to cefepime . Patient has been on multiple different antibiotic regimen.  Currently on cefepime .  ID has recommended 6 weeks of cefepime  with end date of 04/26/2024. Patient to undergo repeat surgical debridement today.  Has a wound VAC in  place.   Type 2 diabetes mellitus A1c 6.3 from April 2025 PTA meds-Jardiance  25 mg daily. Continue SSI.   Hyperlipidemia  Crestor    H/o seizure disorder Continue Lamictal    Morbid Obesity  Body mass index is 40.99 kg/m. Patient has been advised to make an attempt to improve diet and exercise patterns to aid in weight loss.   OSA On CPAP   GERD PPI   H/o varicose vein s/p ablation  several years ago on both legs   DVT prophylaxis: LOVENOX  Code Status:full Family Communication: none Disposition Plan: Home when cleared by orthopedics   Consultants:  ID DR DUDA  Procedures: EXCISIONAL DEBRIDEMENT LLE 5/16 Antimicrobials: CEFEPIME   Subjective: Denies any acute pain in the left leg.  No chest pain shortness of breath nausea or vomiting.  Objective: Vitals:   03/14/24 1940 03/15/24 0414 03/15/24 0821 03/15/24 1024  BP: 125/83 121/78 112/86 132/81  Pulse: 80 72 76 83  Resp: 18 18  19   Temp: (!) 97.5 F (36.4 C) (!) 97.4 F (36.3 C) 97.8 F (36.6 C) 97.9 F (36.6 C)  TempSrc: Oral  Oral Oral  SpO2: 98% 94% 100% 98%  Weight:    131.5 kg  Height:    6' (1.829 m)    Intake/Output Summary (Last 24 hours) at 03/15/2024 1050 Last data filed at 03/15/2024 3474 Gross per 24 hour  Intake --  Output 1000 ml  Net -1000 ml   Filed Weights   03/08/24 0006 03/10/24 0955 03/15/24 1024  Weight: (!) 137.1 kg (!) 137.1 kg 131.5 kg    Examination:  General appearance: Awake alert.  In no distress Resp: Clear to auscultation bilaterally.  Normal effort  Cardio: S1-S2 is normal regular.  No S3-S4.  No rubs murmurs or bruit GI: Abdomen is soft.  Nontender nondistended.  Bowel sounds are present normal.  No masses organomegaly Extremities: Wound VAC noted left lower extremity  Data Reviewed: I have personally reviewed following labs and imaging studies  CBC: Recent Labs  Lab 03/09/24 0553 03/13/24 0733 03/14/24 0111 03/15/24 0332  WBC 7.9 5.2 6.1 5.8  NEUTROABS 5.6   --   --   --   HGB 13.3 11.9* 11.1* 11.7*  HCT 41.2 36.9* 34.7* 35.6*  MCV 92.4 90.0 90.8 90.4  PLT 220 314 311 314   Basic Metabolic Panel: Recent Labs  Lab 03/11/24 0454 03/12/24 1005 03/13/24 0733 03/14/24 0111 03/15/24 0332  NA 133* 136 136 137 138  K 4.2 3.9 3.8 3.8 4.1  CL 101 101 100 99 100  CO2 24 27 27 30 29   GLUCOSE 195* 161* 97 132* 124*  BUN 25* 18 19 18 19   CREATININE 1.49* 1.40* 1.35* 1.56* 1.42*  CALCIUM  8.5* 8.6* 9.0 8.8* 9.1   GFR: Estimated Creatinine Clearance: 85.4 mL/min (A) (by C-G formula based on SCr of 1.42 mg/dL (H)).   CBG: Recent Labs  Lab 03/14/24 1136 03/14/24 1640 03/14/24 2145 03/15/24 0554 03/15/24 1008  GLUCAP 168* 120* 173* 115* 111*     Recent Results (from the past 240 hours)  Culture, blood (Routine x 2)     Status: None   Collection Time: 03/07/24 10:24 PM   Specimen: BLOOD  Result Value Ref Range Status   Specimen Description   Final    BLOOD SITE NOT SPECIFIED Performed at [facility], [address] [place], [place] [address]    Special Requests   Final    BOTTLES DRAWN AEROBIC AND ANAEROBIC Blood Culture adequate volume Performed at [facility], [address], [place], [place] [address]    Culture   Final    NO GROWTH 5 DAYS Performed at [facility], [address] [place], [place] [address]    Report Status 03/13/2024 FINAL  Final  Culture, blood (Routine x 2)     Status: None   Collection Time: 03/07/24 10:29 PM   Specimen: BLOOD LEFT HAND  Result Value Ref Range Status   Specimen Description   Final    BLOOD LEFT HAND Performed at [facility], [address], [place], [place] [address]    Special Requests   Final    BOTTLES DRAWN AEROBIC AND ANAEROBIC Blood Culture adequate volume Performed at [facility], [address] [place], [place] [address]    Culture   Final    NO GROWTH 5 DAYS Performed at [facility], [address], [place], [place] [address]    Report Status 03/13/2024 FINAL  Final  MRSA Next Gen by PCR, Nasal     Status: None   Collection Time: 03/08/24  8:54 PM   Specimen: Nasal Mucosa; Nasal Swab  Result Value Ref Range Status   MRSA by PCR Next Gen NOT DETECTED NOT DETECTED Final    Comment: (NOTE) The GeneXpert MRSA Assay (FDA approved for NASAL specimens only), is one component of a comprehensive MRSA colonization surveillance program. It is not intended to diagnose MRSA infection nor to guide or monitor treatment for MRSA infections. Test performance is not FDA approved in patients less than 39 years old. Performed at [facility], [address] [place],  [place] [address]   MRSA Next Gen by PCR, Nasal     Status: None   Collection Time: 03/10/24  8:14 AM   Specimen: Nasal Mucosa; Nasal Swab  Result Value Ref Range Status   MRSA by PCR Next Gen NOT DETECTED NOT DETECTED Final    Comment: (NOTE) The GeneXpert MRSA Assay (FDA approved for NASAL specimens only), is one component of a comprehensive MRSA colonization surveillance program. It is not intended to diagnose MRSA infection nor to guide or monitor treatment for MRSA infections. Test performance is not FDA approved in patients less than 92 years old. Performed at [facility], [address] [place], [place] [address]   Aerobic/Anaerobic Culture w Gram Stain (surgical/deep wound)     Status: None (Preliminary result)   Collection Time: 03/10/24 11:19 AM   Specimen: Soft Tissue, Other  Result Value Ref Range Status   Specimen Description TISSUE  Final   Special Requests LEFT CALF WOUND  Final   Gram Stain   Final    FEW WBC PRESENT, PREDOMINANTLY PMN RARE GRAM NEGATIVE RODS Performed at [facility], [address] [place], [place] [address]    Culture   Final    FEW ENTEROBACTER CLOACAE NO ANAEROBES ISOLATED; CULTURE IN PROGRESS FOR 5 DAYS    Report Status  PENDING  Incomplete   Organism ID, Bacteria ENTEROBACTER CLOACAE  Final      Susceptibility   Enterobacter cloacae - MIC*    CEFEPIME  0.5 SENSITIVE Sensitive     CEFTAZIDIME >=64 RESISTANT Resistant     CIPROFLOXACIN <=0.25 SENSITIVE Sensitive     GENTAMICIN <=1 SENSITIVE Sensitive     IMIPENEM 1 SENSITIVE Sensitive     TRIMETH/SULFA <=20 SENSITIVE Sensitive     PIP/TAZO >=128 RESISTANT Resistant ug/mL    * FEW ENTEROBACTER CLOACAE      Radiology Studies: US  EKG SITE RITE Result Date: 03/13/2024 If Site Rite image not attached, placement could not be confirmed due to current cardiac rhythm.  US  EKG SITE RITE Result Date: 03/13/2024 If Site Rite image not attached, placement could not be confirmed due to current cardiac rhythm.    Scheduled Meds:  [MAR Hold] acetaminophen   1,000 mg Oral Q8H   ceFAZolin         ceFAZolin  (ANCEF ) IV  3 g Intravenous On Call to OR   chlorhexidine   60 mL Topical Once   [MAR Hold] Chlorhexidine  Gluconate Cloth  6 each Topical Daily   [MAR Hold] docusate sodium   100 mg Oral BID   [MAR Hold] empagliflozin   25 mg Oral Daily   [MAR Hold] enoxaparin  (LOVENOX ) injection  70 mg Subcutaneous Daily   [MAR Hold] insulin  aspart  0-9 Units Subcutaneous TID WC   [MAR Hold] lamoTRIgine   250 mg Oral Daily   [MAR Hold] pantoprazole   40 mg Oral QAC breakfast   [MAR Hold] polyethylene glycol  17 g Oral BID   [MAR Hold] rosuvastatin   10 mg Oral Daily   [MAR Hold] senna-docusate  2 tablet Oral QHS   [MAR Hold] sodium chloride  flush  10-40 mL Intracatheter Q12H   Continuous Infusions:  [MAR Hold] ceFEPime  (MAXIPIME ) IV 2 g (03/15/24 0627)   lactated ringers  10 mL/hr at 03/15/24 1048     LOS: 8 days    Maylene Spear, MD  03/15/2024, 10:50 AM

## 2024-03-15 NOTE — Plan of Care (Signed)

## 2024-03-15 NOTE — Interval H&P Note (Signed)
 History and Physical Interval Note:  03/15/2024 6:56 AM  Joshua Conner Staci Dykes  has presented today for surgery, with the diagnosis of Osteomyelitis Left Leg.  The various methods of treatment have been discussed with the patient and family. After consideration of risks, benefits and other options for treatment, the patient has consented to  Procedure(s) with comments: INCISION AND DRAINAGE OF DEEP ABSCESS, CALF (Left) - DEBRIDEMENT LEFT LEG as a surgical intervention.  The patient's history has been reviewed, patient examined, no change in status, stable for surgery.  I have reviewed the patient's chart and labs.  Questions were answered to the patient's satisfaction.     Vienna Folden V Rockne Dearinger

## 2024-03-15 NOTE — Progress Notes (Signed)
   03/15/24 2017  BiPAP/CPAP/SIPAP  BiPAP/CPAP/SIPAP Pt Type Adult (Pt places himself on his home unit. RT will assist as needed.)  BiPAP/CPAP/SIPAP Resmed  Mask Type Full face mask  Dentures removed? Not applicable  EPAP 7 cmH2O  Patient Home Machine Yes  Patient Home Mask Yes  Patient Home Tubing Yes  Auto Titrate No

## 2024-03-15 NOTE — Anesthesia Procedure Notes (Signed)
 Procedure Name: LMA Insertion Date/Time: 03/15/2024 11:40 AM  Performed by: Rochelle Chu, CRNAPre-anesthesia Checklist: Patient identified, Emergency Drugs available, Suction available and Patient being monitored Patient Re-evaluated:Patient Re-evaluated prior to induction Oxygen Delivery Method: Circle system utilized Preoxygenation: Pre-oxygenation with 100% oxygen Induction Type: IV induction Ventilation: Mask ventilation without difficulty LMA: LMA inserted LMA Size: 5.0 Tube type: Oral Number of attempts: 1 Placement Confirmation: positive ETCO2, breath sounds checked- equal and bilateral and CO2 detector Tube secured with: Tape Dental Injury: Teeth and Oropharynx as per pre-operative assessment

## 2024-03-15 NOTE — Transfer of Care (Signed)
 Immediate Anesthesia Transfer of Care Note  Patient: Joshua Conner  Procedure(s) Performed: INCISION AND DRAINAGE OF DEEP ABSCESS, CALF (Left: Leg Lower)  Patient Location: PACU  Anesthesia Type:General  Level of Consciousness: drowsy and patient cooperative  Airway & Oxygen Therapy: Patient Spontanous Breathing  Post-op Assessment: Report given to RN, Post -op Vital signs reviewed and stable, and Patient moving all extremities X 4  Post vital signs: Reviewed and stable  Last Vitals:  Vitals Value Taken Time  BP 124/86 03/15/24 1211  Temp    Pulse 85 03/15/24 1213  Resp 13 03/15/24 1213  SpO2 97 % 03/15/24 1213  Vitals shown include unfiled device data.  Last Pain:  Vitals:   03/15/24 1033  TempSrc:   PainSc: 4       Patients Stated Pain Goal: 3 (03/13/24 2322)  Complications: No notable events documented.

## 2024-03-16 ENCOUNTER — Encounter (HOSPITAL_COMMUNITY): Payer: Self-pay | Admitting: Orthopedic Surgery

## 2024-03-16 ENCOUNTER — Other Ambulatory Visit (HOSPITAL_COMMUNITY): Payer: Self-pay

## 2024-03-16 DIAGNOSIS — A419 Sepsis, unspecified organism: Secondary | ICD-10-CM | POA: Diagnosis not present

## 2024-03-16 LAB — GLUCOSE, CAPILLARY
Glucose-Capillary: 135 mg/dL — ABNORMAL HIGH (ref 70–99)
Glucose-Capillary: 162 mg/dL — ABNORMAL HIGH (ref 70–99)

## 2024-03-16 MED ORDER — POLYETHYLENE GLYCOL 3350 17 GM/SCOOP PO POWD
17.0000 g | Freq: Every day | ORAL | 0 refills | Status: AC | PRN
  Filled 2024-03-16: qty 476, 28d supply, fill #0

## 2024-03-16 MED ORDER — OXYCODONE HCL 5 MG PO TABS
5.0000 mg | ORAL_TABLET | ORAL | 0 refills | Status: AC | PRN
  Filled 2024-03-16: qty 30, 5d supply, fill #0

## 2024-03-16 MED ORDER — HEPARIN SOD (PORK) LOCK FLUSH 100 UNIT/ML IV SOLN
500.0000 [IU] | Freq: Once | INTRAVENOUS | Status: AC
  Administered 2024-03-16: 250 [IU] via INTRAVENOUS
  Filled 2024-03-16: qty 5

## 2024-03-16 MED ORDER — SENNOSIDES-DOCUSATE SODIUM 8.6-50 MG PO TABS
2.0000 | ORAL_TABLET | Freq: Every day | ORAL | 0 refills | Status: AC
  Filled 2024-03-16: qty 60, 30d supply, fill #0

## 2024-03-16 MED ORDER — METHOCARBAMOL 500 MG PO TABS
500.0000 mg | ORAL_TABLET | Freq: Four times a day (QID) | ORAL | 0 refills | Status: AC | PRN
Start: 2024-03-16 — End: ?
  Filled 2024-03-16: qty 30, 8d supply, fill #0

## 2024-03-16 MED ORDER — HEPARIN SOD (PORK) LOCK FLUSH 100 UNIT/ML IV SOLN
250.0000 [IU] | INTRAVENOUS | Status: AC | PRN
  Administered 2024-03-16: 250 [IU]

## 2024-03-16 NOTE — Progress Notes (Signed)
 Patient ID: Joshua Conner, male   DOB: 1971-07-19, 53 y.o.   MRN: [id number] Patient is status post repeat debridement of osteo myelitis and ulceration tibia.  There is only 50 cc in the wound VAC canister.  Patient should be able to discharge to home with IV antibiotics and a Praveena pump.  I will place the order for the pump.

## 2024-03-16 NOTE — Progress Notes (Signed)
 PT Cancellation Note  Patient Details Name: Joshua Conner MRN: [id number] DOB: 09-30-1971   Cancelled Treatment:    Reason Eval/Treat Not Completed: PT screened, no needs identified, will sign off (Patient reports he has been ambulating in the room and laps in the hallway independently. He declines PT needs at this time and is anticipated to d/c home today.)  Ozie Bo, PT, MPT  Erlene Hawks 03/16/2024, 1:42 PM

## 2024-03-16 NOTE — TOC Transition Note (Addendum)
 Transition of Care [facility]) - Discharge Note   Patient Details  Name: Joshua Conner MRN: [id number] Date of Birth: 10-Mar-1971  Transition of Care [facility]) CM/SW Contact:  Alisa App, RN Phone Number: 03/16/2024, 9:58 AM   Clinical Narrative:    Patient will DC to: home Anticipated DC date: 03/16/2024 Family notified: yes Transport by: car     -S/P INCISION AND DRAINAGE OF DEEP ABSCESS, CALF (Left: Leg Lower), 5/16      - per ID, Culture enterobacter cloacae (S cipro/bactrim)   Per MD patient ready for DC today once midday IV  ABX  completed. Pt will d/c with LT IV ABX need, and Praveena vac. Pt agreeable with HH services. RN, patient, patient's family, Pam/[facility]( antbiotic therapy) and Amy/[facility]  [facility]) notified of DC. Family to assist with care once d/c. Post hospital f/u noted on AVS. Pt without RX med concerns. Family to provide transportation to home.  03/16/2024 1600 NCM received call from Amy with [facility]. [facility] services will begin on Monday, pt  made aware  RNCM will sign off for now as intervention is no longer needed. Please consult us  again if new needs arise.    Final next level of care: Home w Home Health Services Barriers to Discharge: No Barriers Identified   Patient Goals and CMS Choice            Discharge Placement                       Discharge Plan and Services Additional resources added to the After Visit Summary for                            [facility] Arranged: RN [facility] Agency: [facility] Date [facility] Agency Contacted: 03/09/24 Time HH Agency Contacted: 1440 Representative spoke with at [facility] Agency: Amy  Social Drivers of Health (SDOH) Interventions SDOH Screenings   Food Insecurity: No Food Insecurity (03/08/2024)  Housing: Low Risk  (03/08/2024)  Transportation Needs: No Transportation Needs (03/08/2024)  Utilities: Not At Risk (03/08/2024)  Social Connections: Socially Integrated (03/08/2024)   Tobacco Use: Unknown (03/15/2024)     Readmission Risk Interventions    03/09/2024    2:40 PM 03/08/2024    2:07 PM  Readmission Risk Prevention Plan  Post Dischage Appt Complete   Medication Screening Complete   Transportation Screening Complete Complete  PCP or Specialist Appt within 5-7 Days  Complete  Home Care Screening  Complete  Medication Review (RN CM)  Complete

## 2024-03-16 NOTE — Discharge Summary (Signed)
 [facility]  Physician Discharge Summary   Patient ID: Joshua Conner MRN: [id number] DOB/AGE: 1970/11/06 53 y.o.  Admit date: 03/07/2024 Discharge date: 03/16/2024    PCP: Jimmey Mould, MD  DISCHARGE DIAGNOSES:    Sepsis [facility])   Cellulitis and abscess of left lower extremity   Controlled type 2 diabetes mellitus without complication, without long-term current use of insulin  (HCC)   Hyperlipidemia   Cellulitis   History of seizure disorder   CKD stage 3a, GFR 45-59 ml/min (HCC)   Obesity, Class III, BMI 40-49.9 (morbid obesity)   OSA (obstructive sleep apnea)   Osteomyelitis (HCC)   RECOMMENDATIONS FOR OUTPATIENT FOLLOW UP: Outpatient follow-up with Dr. Julio Ohm Home IV antibiotics have been ordered  Home Health: RN Equipment/Devices: Wound VAC  CODE STATUS: Full code  DISCHARGE CONDITION: fair  Diet recommendation: As before  INITIAL HISTORY: 53 y.o. male with PMH significant for DM2, HTN, HLD, morbid obesity, OSA on CPAP, seizure disorder, GERD, meningioma, varicose veins s/p ablation. On 02/03/24, while at work, piece of quartz fell on his left leg and sustained traumatic laceration.  The wound got infected eventually.  Since then, patient has had multiple courses of antibiotics, evaluation of the wound care center and also hospitalization for IV antibiotics in April. 5/12, 1 day prior to presentation, seen at wound care center. Underwent debridement. 5/13, patient noticed increasing redness around the wound and purulent drainage from the wound itself.  Later in the day, he had a scheduled appointment with ID but prior to their appointment, patient presented to the ED with foul-smelling drainage, subjective fever, nausea, vomiting and a concern of recurrent infection of chronic wound.  Patient was tachycardic with leukocytosis in the ED.  Consultations: Dr. Julio Ohm with orthopedics Infectious disease  Procedures: Incision and drainage of abscess    HOSPITAL COURSE:   Left leg abscess/sepsis POA X-ray of the left tibia-fibula at the time of admission was negative for osteomyelitis.  Since patient continued to complain of pain a MRI of the left lower extremity was done which was concerning for abscess. Status post excisional debridement and application of the wound VAC Dr. Julio Ohm 03/10/2024. Patient failed outpatient debridement as well as multiple courses of inpatient and outpatient antibiotics.   Met criteria for sepsis on admission with leukocytosis tachycardia lactic acidosis. Patient was seen by ID as well. Surgical culture growing enterococcal cloacae, sensitive to cefepime . Patient has been on multiple different antibiotic regimen.  Currently on cefepime .  ID has recommended 6 weeks of cefepime  with end date of 04/26/2024. Patient to be discharged with portable wound VAC.  Follow-up with orthopedics in the outpatient setting.   Type 2 diabetes mellitus A1c 6.3 from April 2025   Hyperlipidemia  Crestor    H/o seizure disorder Continue Lamictal    Morbid Obesity  Body mass index is 40.99 kg/m. Patient has been advised to make an attempt to improve diet and exercise patterns to aid in weight loss.   OSA On CPAP   GERD PPI   H/o varicose vein s/p ablation  several years ago on both legs    Patient stable.  Cleared by orthopedics for discharge today.   PERTINENT LABS:  The results of significant diagnostics from this hospitalization (including imaging, microbiology, ancillary and laboratory) are listed below for reference.    Microbiology: Recent Results (from the past 240 hours)  Culture, blood (Routine x 2)     Status: None   Collection Time: 03/07/24 10:24 PM   Specimen: BLOOD  Result  Value Ref Range Status   Specimen Description   Final    BLOOD SITE NOT SPECIFIED Performed at [facility], [address] [place], [place] [address]    Special Requests   Final    BOTTLES DRAWN AEROBIC AND  ANAEROBIC Blood Culture adequate volume Performed at [facility], [address] [place], [place] [address]    Culture   Final    NO GROWTH 5 DAYS Performed at [facility], [address] [place], [place] [address]    Report Status 03/13/2024 FINAL  Final  Culture, blood (Routine x 2)     Status: None   Collection Time: 03/07/24 10:29 PM   Specimen: BLOOD LEFT HAND  Result Value Ref Range Status   Specimen Description   Final    BLOOD LEFT HAND Performed at [facility], [address], [place], [place] [address]    Special Requests   Final    BOTTLES DRAWN AEROBIC AND ANAEROBIC Blood Culture adequate volume Performed at [facility], [address] [place], [place] [address]    Culture   Final    NO GROWTH 5 DAYS Performed at [facility], [address], [place], [place] [address]    Report Status 03/13/2024 FINAL  Final  MRSA Next Gen by PCR, Nasal     Status: None   Collection Time: 03/08/24  8:54 PM   Specimen: Nasal Mucosa; Nasal Swab  Result Value Ref Range Status   MRSA by PCR Next Gen NOT DETECTED NOT DETECTED Final    Comment: (NOTE) The GeneXpert MRSA Assay (FDA approved for NASAL specimens only), is one component of a comprehensive MRSA colonization surveillance program. It is not intended to diagnose MRSA infection nor to guide or monitor treatment for MRSA infections. Test performance is not FDA approved in patients less than 20 years old. Performed at [facility], [address] [place], [place] [address]   MRSA Next Gen by PCR, Nasal     Status: None   Collection Time: 03/10/24  8:14 AM   Specimen: Nasal Mucosa; Nasal Swab  Result Value Ref Range Status   MRSA by PCR Next Gen NOT DETECTED NOT DETECTED Final    Comment: (NOTE) The GeneXpert MRSA Assay (FDA approved for NASAL specimens only), is one component of a comprehensive MRSA colonization  surveillance program. It is not intended to diagnose MRSA infection nor to guide or monitor treatment for MRSA infections. Test performance is not FDA approved in patients less than 4 years old. Performed at [facility], [address] [place], [place] [address]   Aerobic/Anaerobic Culture w Gram Stain (surgical/deep wound)     Status: None   Collection Time: 03/10/24 11:19 AM   Specimen: Soft Tissue, Other  Result Value Ref Range Status   Specimen Description TISSUE  Final   Special Requests LEFT CALF WOUND  Final   Gram Stain   Final    FEW WBC PRESENT, PREDOMINANTLY PMN RARE GRAM NEGATIVE RODS    Culture   Final    FEW ENTEROBACTER CLOACAE NO ANAEROBES ISOLATED Performed at [facility], [address] [place], [place] [address]    Report Status 03/15/2024 FINAL  Final   Organism ID, Bacteria ENTEROBACTER CLOACAE  Final      Susceptibility   Enterobacter cloacae - MIC*    CEFEPIME  0.5 SENSITIVE Sensitive     CEFTAZIDIME >=64 RESISTANT Resistant  CIPROFLOXACIN <=0.25 SENSITIVE Sensitive     GENTAMICIN <=1 SENSITIVE Sensitive     IMIPENEM 1 SENSITIVE Sensitive     TRIMETH/SULFA <=20 SENSITIVE Sensitive     PIP/TAZO >=128 RESISTANT Resistant ug/mL    * FEW ENTEROBACTER CLOACAE     Labs:   Basic Metabolic Panel: Recent Labs  Lab 03/11/24 0454 03/12/24 1005 03/13/24 0733 03/14/24 0111 03/15/24 0332  NA 133* 136 136 137 138  K 4.2 3.9 3.8 3.8 4.1  CL 101 101 100 99 100  CO2 24 27 27 30 29   GLUCOSE 195* 161* 97 132* 124*  BUN 25* 18 19 18 19   CREATININE 1.49* 1.40* 1.35* 1.56* 1.42*  CALCIUM  8.5* 8.6* 9.0 8.8* 9.1    CBC: Recent Labs  Lab 03/13/24 0733 03/14/24 0111 03/15/24 0332  WBC 5.2 6.1 5.8  HGB 11.9* 11.1* 11.7*  HCT 36.9* 34.7* 35.6*  MCV 90.0 90.8 90.4  PLT 314 311 314     CBG: Recent Labs  Lab 03/15/24 1213 03/15/24 1643 03/15/24 2129 03/16/24 0617 03/16/24 1134  GLUCAP 111* 199* 195* 135* 162*     IMAGING  STUDIES US  EKG SITE RITE Result Date: 03/13/2024 If Site Rite image not attached, placement could not be confirmed due to current cardiac rhythm.  US  EKG SITE RITE Result Date: 03/13/2024 If Site Rite image not attached, placement could not be confirmed due to current cardiac rhythm.  MR TIBIA FIBULA LEFT W WO CONTRAST Result Date: 03/09/2024 CLINICAL DATA:  Left shin wound with cellulitis.  Pain. EXAM: MRI OF LOWER LEFT EXTREMITY WITHOUT AND WITH CONTRAST TECHNIQUE: Multiplanar, multisequence MR imaging of the left tibia and fibula was performed both before and after administration of intravenous contrast. CONTRAST:  10mL GADAVIST  GADOBUTROL  1 MMOL/ML IV SOLN COMPARISON:  Left tibia and fibula radiographs dated 03/07/2024. CT of the left tibia and fibula dated 02/15/2024. FINDINGS: Bones/Joint/Cartilage Periosteal edema and enhancement extending along the mid to distal anterior tibial diaphysis, most compatible with periostitis. No discrete marrow signal abnormality identified. No fracture or dislocation. Normal alignment. No visualized joint effusion. Ligaments Collateral ligaments appear grossly intact. Muscles and Tendons Flexor, peroneal and extensor compartment tendons are intact. Visualized patellar tendon is intact. Muscles are normal. No intramuscular collection or abnormal enhancement Soft tissue There is a large ulcerated soft tissue wound anteromedially extending along the mid lower leg, which tracks deep through the subcutaneous fat measuring approximately 5.2 x 1.2 cm in axial dimension and 9.8 cm in craniocaudal dimension. There is surrounding inflammatory change with soft tissue edema, enhancement, and cutaneous thickening, compatible with cellulitis. A 3.7 x 0.8 cm lobular septated rim enhancing region of fluid signal, concerning for abscess, within the anteromedial subcutaneous soft tissues just distal to the inferior margin of the large ulcerated wound described above (series 11, image 53  and series 18, image 51). Diffuse circumferential subcutaneous edema extending through the mid to distal lower leg. IMPRESSION: 1. Large ulcerated soft tissue wound extending along the left anteromedial mid to distal lower leg with surrounding cellulitis. A 3.7 x 0.8 cm lobular septated region of rim enhancing fluid within the anteromedial subcutaneous soft tissues just distal to the inferior margin of the large ulcerated wound described above is concerning for abscess. 2. Findings most compatible with periostitis extending along the mid to distal anterior tibial diaphysis, presumably reactive to the inflammatory changes described above. Electronically Signed   By: Mannie Seek M.D.   On: 03/09/2024 13:44   DG Tibia/Fibula Left Result Date: 03/07/2024  CLINICAL DATA:  Left lower extremity infection EXAM: LEFT TIBIA AND FIBULA - 2 VIEW COMPARISON:  02/11/2024 FINDINGS: There is no evidence of fracture or other focal bone lesions. Soft tissues are unremarkable. IMPRESSION: Negative. Electronically Signed   By: Juanetta Nordmann M.D.   On: 03/07/2024 23:23    DISCHARGE EXAMINATION: Vitals:   03/15/24 1334 03/15/24 1947 03/16/24 0444 03/16/24 0750  BP: 122/79 (!) 127/90 124/88 (!) 121/93  Pulse: 84 (!) 107 79 72  Resp: 16 18 18    Temp: 98 F (36.7 C) 97.9 F (36.6 C) (!) 97.4 F (36.3 C) (!) 97.5 F (36.4 C)  TempSrc: Oral   Oral  SpO2: 95% 91% 94% 92%  Weight:      Height:       General appearance: Awake alert.  In no distress Resp: Clear to auscultation bilaterally.  Normal effort Cardio: S1-S2 is normal regular.  No S3-S4.  No rubs murmurs or bruit GI: Abdomen is soft.  Nontender nondistended.  Bowel sounds are present normal.  No masses organomegaly    DISPOSITION: Home  Discharge Instructions     [facility] pharmacist to adjust dose for Vancomycin , Aminoglycosides and other anti-infective therapies as requested by physician.   Complete by: As directed    [facility] to provide Cath Flo 2mg    Complete by: As directed    Administer for PICC line occlusion and as ordered by physician for other access device issues.   Anaphylaxis Kit: Provided to treat any anaphylactic reaction to the medication being provided to the patient if First Dose or when requested by physician   Complete by: As directed    Epinephrine  1mg /ml vial / amp: Administer 0.3mg  (0.43ml) subcutaneously once for moderate to severe anaphylaxis, nurse to call physician and pharmacy when reaction occurs and call 911 if needed for immediate care   Diphenhydramine 50mg /ml IV vial: Administer 25-50mg  IV/IM PRN for first dose reaction, rash, itching, mild reaction, nurse to call physician and pharmacy when reaction occurs   Sodium Chloride  0.9% NS 500ml IV: Administer if needed for hypovolemic blood pressure drop or as ordered by physician after call to physician with anaphylactic reaction   Call MD for:  difficulty breathing, headache or visual disturbances   Complete by: As directed    Call MD for:  extreme fatigue   Complete by: As directed    Call MD for:  persistant dizziness or light-headedness   Complete by: As directed    Call MD for:  persistant nausea and vomiting   Complete by: As directed    Call MD for:  redness, tenderness, or signs of infection (pain, swelling, redness, odor or green/yellow discharge around incision site)   Complete by: As directed    Call MD for:  severe uncontrolled pain   Complete by: As directed    Call MD for:  temperature >100.4   Complete by: As directed    Change dressing on IV access line weekly and PRN   Complete by: As directed    Diet - low sodium heart healthy   Complete by: As directed    Discharge instructions   Complete by: As directed    Please take your medications as prescribed.  Please be sure to follow-up with Dr. Julio Ohm as instructed by them.  Wound care as instructed by home.  You were cared for by a hospitalist during your hospital  stay. If you have any questions about your discharge medications or the care you  received while you were in the hospital after you are discharged, you can call the unit and asked to speak with the hospitalist on call if the hospitalist that took care of you is not available. Once you are discharged, your primary care physician will handle any further medical issues. Please note that NO REFILLS for any discharge medications will be authorized once you are discharged, as it is imperative that you return to your primary care physician (or establish a relationship with a primary care physician if you do not have one) for your aftercare needs so that they can reassess your need for medications and monitor your lab values. If you do not have a primary care physician, you can call [phone] for a physician referral.   Discharge wound care:   Complete by: As directed    Negative Pressure Wound Therapy - Incisional      Comments: Attached the wound VAC dressing to a Prevena plus portable wound VAC pouch for discharge.  Show patient how to plug this and to keep it charged.   Flush IV access with Sodium Chloride  0.9% and Heparin  10 units/ml or 100 units/ml   Complete by: As directed    Home infusion instructions - [facility]   Complete by: As directed    Instructions: Flush IV access with Sodium Chloride  0.9% and Heparin  10units/ml or 100units/ml   Change dressing on IV access line: Weekly and PRN   Instructions Cath Flo 2mg : Administer for PICC Line occlusion and as ordered by physician for other access device   [facility] pharmacist to adjust dose for: Vancomycin , Aminoglycosides and other anti-infective therapies as requested by physician   Increase activity slowly   Complete by: As directed    Method of administration may be changed at the discretion of home infusion pharmacist based upon assessment of the patient and/or caregiver's ability to self-administer the medication ordered    Complete by: As directed    Negative Pressure Wound Therapy - Incisional   Complete by: As directed    Attached the wound VAC dressing to a Prevena plus portable wound VAC pouch for discharge.  Show patient how to plug this and to keep it charged.         Allergies as of 03/16/2024   No Known Allergies      Medication List     STOP taking these medications    amoxicillin -clavulanate 875-125 MG tablet Commonly known as: AUGMENTIN    doxycycline  100 MG tablet Commonly known as: VIBRA -TABS       TAKE these medications    ceFEPime  IVPB Commonly known as: MAXIPIME  Inject 2 g into the vein every 8 (eight) hours. Indication:  Left Leg Osteomyelitis  First Dose: Yes Last Day of Therapy:  04/26/24 Labs - Once weekly:  CBC/D and BMP, Labs - Once weekly: ESR and CRP Method of administration: IV Push Method of administration may be changed at the discretion of home infusion pharmacist based upon assessment of the patient and/or caregiver's ability to self-administer the medication ordered.   Clear Eyes Contact Lens Relief Soln Place 1 drop into both eyes as needed (for dry eyes). For rewetting contact lenses.   Jardiance  25 MG Tabs tablet Generic drug: empagliflozin  Take 25 mg by mouth in the morning.   LamoTRIgine  250 MG Tb24 24 hour tablet Take 250 mg by mouth in the morning.   methocarbamol  500 MG tablet Commonly known as: ROBAXIN  Take 1 tablet (500 mg total) by mouth every 6 (  six) hours as needed for muscle spasms.   oxyCODONE  5 MG immediate release tablet Commonly known as: Oxy IR/ROXICODONE  Take 1 tablet (5 mg total) by mouth every 4 (four) hours as needed for severe pain (pain score 7-10).   pantoprazole  40 MG tablet Commonly known as: PROTONIX  Take 40 mg by mouth daily before breakfast.   polyethylene glycol powder 17 GM/SCOOP powder Commonly known as: GLYCOLAX /MIRALAX  Take 17 g by mouth daily as needed.   rosuvastatin  10 MG tablet Commonly known as:  CRESTOR  Take 10 mg by mouth in the morning.   Senna-S 8.6-50 MG tablet Generic drug: senna-docusate Take 2 tablets by mouth at bedtime.   TYLENOL  500 MG tablet Generic drug: acetaminophen  Take 1,000 mg by mouth every 6 (six) hours as needed (for pain).               Discharge Care Instructions  (From admission, onward)           Start     Ordered   03/16/24 0000  Discharge wound care:       Comments: Negative Pressure Wound Therapy - Incisional      Comments: Attached the wound VAC dressing to a Prevena plus portable wound VAC pouch for discharge.  Show patient how to plug this and to keep it charged.   03/16/24 0928   03/15/24 0000  Change dressing on IV access line weekly and PRN  (Home infusion instructions - [facility] )        03/15/24 1439              Follow-up Information     [facility] Follow up.   Why: [facility] to follow up with you at discharge to provide home health nursing for wound care Contact information: [address] [place] [address] [phone]         Timothy Ford, MD Follow up in 1 week(s).   Specialty: Orthopedic Surgery Contact information: [address] [place]  [address] [place] [address] [phone]                 TOTAL DISCHARGE TIME: 35 minutes  Harris Kistler Lyndon Santiago  [facility] Pager on [email or web address]  03/17/2024, 10:40 AM

## 2024-03-16 NOTE — Plan of Care (Signed)
  Problem: Education: Goal: Knowledge of General Education information will improve Description: Including pain rating scale, medication(s)/side effects and non-pharmacologic comfort measures Outcome: Progressing   Problem: Health Behavior/Discharge Planning: Goal: Ability to manage health-related needs will improve Outcome: Progressing   Problem: Clinical Measurements: Goal: Ability to maintain clinical measurements within normal limits will improve Outcome: Progressing   Problem: Pain Managment: Goal: General experience of comfort will improve and/or be controlled Outcome: Progressing   Problem: Safety: Goal: Ability to remain free from injury will improve Outcome: Progressing

## 2024-03-21 ENCOUNTER — Ambulatory Visit (HOSPITAL_BASED_OUTPATIENT_CLINIC_OR_DEPARTMENT_OTHER): Admitting: Internal Medicine

## 2024-03-22 ENCOUNTER — Encounter: Payer: Self-pay | Admitting: Family

## 2024-03-22 ENCOUNTER — Ambulatory Visit (INDEPENDENT_AMBULATORY_CARE_PROVIDER_SITE_OTHER): Admitting: Family

## 2024-03-22 ENCOUNTER — Other Ambulatory Visit (HOSPITAL_COMMUNITY): Payer: Self-pay | Admitting: Internal Medicine

## 2024-03-22 DIAGNOSIS — S8012XD Contusion of left lower leg, subsequent encounter: Secondary | ICD-10-CM

## 2024-03-22 DIAGNOSIS — L97924 Non-pressure chronic ulcer of unspecified part of left lower leg with necrosis of bone: Secondary | ICD-10-CM

## 2024-03-22 DIAGNOSIS — L03116 Cellulitis of left lower limb: Secondary | ICD-10-CM

## 2024-03-22 DIAGNOSIS — L97828 Non-pressure chronic ulcer of other part of left lower leg with other specified severity: Secondary | ICD-10-CM

## 2024-03-22 DIAGNOSIS — M8600 Acute hematogenous osteomyelitis, unspecified site: Secondary | ICD-10-CM

## 2024-03-22 NOTE — Progress Notes (Signed)
 Post-Op Visit Note   Patient: Joshua Conner           Date of Birth: 15-May-1971           MRN: [id number] Visit Date: 03/22/2024 PCP: Jimmey Mould, MD  Chief Complaint:  Chief Complaint  Patient presents with   Left Leg - Routine Post Op    03/10/2024 and  03/15/2024 I&D left calf abscess     HPI:  HPI The patient is a 53 year old gentleman who is seen status post repeat irrigation debridement of the left calf for abscess most recently May 21 he was sent home with a wound VAC as well as a PICC line he has infectious disease follow-up scheduled he is on cefepime  according to his susceptibilities  Reports subjective fever he has been having chills has not had a fever at home was 98.2 at last check however he did take Tylenol  this morning  Here in our office was afebrile Ortho Exam On examination right lower extremity anteriorly the ulcer is sutured there is graft material present with good uptake there is no surrounding erythema no maceration no Visit Diagnoses: No diagnosis found.  Plan: Begin Vashe to dry dressing changes have spoken with [facility] they will be checking labs today or tomorrow.  He will continue on cefepime  as scheduled he will call for any concern discussed these with the patient  Follow-Up Instructions: No follow-ups on file.   Imaging: No results found.  Orders:  No orders of the defined types were placed in this encounter.  No orders of the defined types were placed in this encounter.    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) 03/10/2024   History of seizure disorder 03/08/2024   CKD stage 3a, GFR 45-59 ml/min (HCC) 03/08/2024   Sepsis (HCC) 03/08/2024   Obesity, Class III, BMI 40-49.9 (morbid obesity) 03/08/2024   OSA (obstructive sleep apnea) 03/08/2024   Cellulitis 03/07/2024   Controlled type 2 diabetes mellitus  without complication, without long-term current use of insulin  (HCC) 02/18/2024   Hyperlipidemia 02/18/2024   Cellulitis of left lower extremity 02/14/2024   Aftercare following surgery of the circulatory system 08/28/2014   Varicose veins of leg with complications 08/07/2014   Varicose veins of bilateral lower extremities with other complications 05/01/2014   Past Medical History:  Diagnosis Date   Acid reflux    Diabetes mellitus without complication (HCC)    Seizures (HCC)    Varicose veins     Family History  Problem Relation Age of Onset   Hypertension Mother    COPD Mother    Cancer Mother 82       breast   Hypertension Father    Hypertension Sister     Past Surgical History:  Procedure Laterality Date   APPLICATION OF WOUND VAC Left 03/10/2024   Procedure: APPLICATION, WOUND VAC;  Surgeon: Timothy Ford, MD;  Location: [facility] OR;  Service: Orthopedics;  Laterality: Left;   HERNIA REPAIR     INCISION AND DRAINAGE OF DEEP ABSCESS, CALF Left 03/10/2024   Procedure: LEFT CALF DEBRIDEMENT;  Surgeon: Timothy Ford, MD;  Location: [facility] OR;  Service: Orthopedics;  Laterality: Left;  LEFT LEG DEBRIDEMENT   INCISION AND DRAINAGE OF DEEP ABSCESS, CALF Left 03/15/2024   Procedure: INCISION AND DRAINAGE OF DEEP ABSCESS, CALF;  Surgeon: Timothy Ford, MD;  Location: [facility] OR;  Service: Orthopedics;  Laterality: Left;  DEBRIDEMENT LEFT LEG   INGUINAL HERNIA REPAIR Left 02/04/2015   Procedure: LAPAROSCOPIC LEFT  INGUINAL HERNIA REPAIR WITH MESH;  Surgeon: Shela Derby, MD;  Location: [facility] ORS;  Service: General;  Laterality: Left;   INSERTION OF MESH Left 02/04/2015   Procedure: INSERTION OF MESH;  Surgeon: Shela Derby, MD;  Location: [facility] ORS;  Service: General;  Laterality: Left;   KNEE SURGERY     VARICOSE VEIN SURGERY     Social History   Occupational History   Not on file  Tobacco Use   Smoking status: Never   Smokeless tobacco: Not on file  Substance and Sexual Activity   Alcohol  use: Yes    Alcohol/week: 12.0 standard drinks of alcohol    Types: 12 Cans of beer per week    Comment: approx 6 beers a week   Drug use: No   Sexual activity: Not on file

## 2024-03-26 ENCOUNTER — Encounter: Payer: Self-pay | Admitting: Orthopedic Surgery

## 2024-03-27 ENCOUNTER — Other Ambulatory Visit: Payer: Self-pay | Admitting: Orthopedic Surgery

## 2024-03-27 MED ORDER — GABAPENTIN 100 MG PO CAPS: 100.0000 mg | ORAL_CAPSULE | Freq: Three times a day (TID) | ORAL | 0 refills | Status: DC

## 2024-03-28 ENCOUNTER — Encounter: Payer: Self-pay | Admitting: Internal Medicine

## 2024-03-28 ENCOUNTER — Telehealth: Payer: Self-pay | Admitting: Radiology

## 2024-03-28 ENCOUNTER — Ambulatory Visit: Admitting: Internal Medicine

## 2024-03-28 ENCOUNTER — Other Ambulatory Visit: Payer: Self-pay

## 2024-03-28 ENCOUNTER — Telehealth: Payer: Self-pay

## 2024-03-28 VITALS — BP 119/79 | HR 83 | Temp 97.5°F | Ht 73.0 in | Wt 290.0 lb

## 2024-03-28 DIAGNOSIS — L97924 Non-pressure chronic ulcer of unspecified part of left lower leg with necrosis of bone: Secondary | ICD-10-CM

## 2024-03-28 NOTE — Telephone Encounter (Signed)
 Daniel, nurse with [facility], called needing to know the etiology of patient's ulcer. Per Dr. Ruta Cousins with osteomyelitis.  Daniel advised.

## 2024-03-28 NOTE — Telephone Encounter (Signed)
 Called [facility] and requested patient's lab results be faxed to triage. They state they don't have any labs on file for patient. They will reach out to home health to try and obtain results.   Gottlieb Zuercher, BSN, RN

## 2024-03-28 NOTE — Progress Notes (Signed)
 Labs drawn via PICC line per Dr. Zelda Hickman. Line flushed with 10 mL normal saline and clamped. Patient tolerated procedure well.   Field Staniszewski, BSN, RN

## 2024-03-28 NOTE — Progress Notes (Signed)
 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) 03/10/2024   History of seizure disorder 03/08/2024   CKD stage 3a, GFR 45-59 ml/min (HCC) 03/08/2024   Sepsis (HCC) 03/08/2024   Obesity, Class III, BMI 40-49.9 (morbid obesity) 03/08/2024   OSA (obstructive sleep apnea) 03/08/2024   Cellulitis 03/07/2024   Controlled type 2 diabetes mellitus without complication, without long-term current use of insulin  (HCC) 02/18/2024   Hyperlipidemia 02/18/2024   Cellulitis of left lower extremity 02/14/2024   Aftercare following surgery of the circulatory system 08/28/2014   Varicose veins of leg with complications 08/07/2014   Varicose veins of bilateral lower extremities with other complications 05/01/2014    Patient's Medications  New Prescriptions   No medications on file  Previous Medications   CEFEPIME  (MAXIPIME ) IVPB    Inject 2 g into the vein every 8 (eight) hours. Indication:  Left Leg Osteomyelitis  First Dose: Yes Last Day of Therapy:  04/26/24 Labs - Once weekly:  CBC/D and BMP, Labs - Once weekly: ESR and CRP Method of administration: IV Push Method of administration may be changed at the discretion of home infusion pharmacist based upon assessment of the patient and/or caregiver's ability to self-administer the medication ordered.   GABAPENTIN  (NEURONTIN ) 100 MG CAPSULE    Take 1 capsule (100 mg total) by mouth 3 (three) times daily.   JARDIANCE  25 MG TABS TABLET    Take 25 mg by mouth in the morning.   LAMOTRIGINE  250 MG TB24 24 HOUR TABLET    Take 250 mg by mouth in the morning.   METHOCARBAMOL  (ROBAXIN ) 500 MG TABLET    Take 1 tablet (500 mg total) by mouth every 6 (six) hours as needed for muscle spasms.   OXYCODONE  (OXY IR/ROXICODONE ) 5 MG IMMEDIATE RELEASE TABLET    Take 1 tablet (5 mg total) by mouth every 4 (four) hours as needed for severe pain (pain score 7-10).    PANTOPRAZOLE  (PROTONIX ) 40 MG TABLET    Take 40 mg by mouth daily before breakfast.   POLYETHYLENE GLYCOL POWDER (GLYCOLAX /MIRALAX ) 17 GM/SCOOP POWDER    Take 17 g by mouth daily as needed.   ROSUVASTATIN  (CRESTOR ) 10 MG TABLET    Take 10 mg by mouth in the morning.   SENNA-DOCUSATE (SENOKOT-S) 8.6-50 MG TABLET    Take 2 tablets by mouth at bedtime.   SOFT LENS PRODUCTS (CLEAR EYES CONTACT LENS RELIEF) SOLN    Place 1 drop into both eyes as needed (for dry eyes). For rewetting contact lenses.   TYLENOL  500 MG TABLET    Take 1,000 mg by mouth every 6 (six) hours as needed (for pain).  Modified Medications   No medications on file  Discontinued Medications   No medications on file    Subjective: 53 year old male with past medical history as below includes diabetes mellitus, varicose veins presents for hospital follow-up ofLeft calf ulcer with cellulitis and abscess and subcutaneous tissue per MRI status post I&D with wound VAC placement on 5/15 debridement to bone with concern for osteomyelitis cultures growing Enterobacter cloacae S sensitive to ciprofloxacin and Bactrim.  Discharged on cefepime  x 6 weeks EOT 04/26/2024 Today: Doing well no new complaints denies fever and chills Review of Systems: Review of Systems  All other systems reviewed and are negative.   Past Medical History:  Diagnosis Date  Acid reflux    Diabetes mellitus without complication (HCC)    Seizures (HCC)    Varicose veins     Social History   Tobacco Use   Smoking status: Never  Substance Use Topics   Alcohol use: Yes    Alcohol/week: 12.0 standard drinks of alcohol    Types: 12 Cans of beer per week    Comment: approx 6 beers a week   Drug use: No    Family History  Problem Relation Age of Onset   Hypertension Mother    COPD Mother    Cancer Mother 56       breast   Hypertension Father    Hypertension Sister     No Known Allergies  Health Maintenance  Topic Date Due   FOOT EXAM  Never done    OPHTHALMOLOGY EXAM  Never done   Diabetic kidney evaluation - Urine ACR  Never done   Hepatitis C Screening  Never done   DTaP/Tdap/Td (1 - Tdap) Never done   Pneumococcal Vaccine 62-85 Years old (1 of 2 - PCV) Never done   Colonoscopy  Never done   Zoster Vaccines- Shingrix (1 of 2) Never done   COVID-19 Vaccine (2 - 2024-25 season) 06/27/2023   INFLUENZA VACCINE  05/26/2024   HEMOGLOBIN A1C  09/07/2024   Diabetic kidney evaluation - eGFR measurement  03/15/2025   HIV Screening  Completed   HPV VACCINES  Aged Out   Meningococcal B Vaccine  Aged Out    Objective:  There were no vitals filed for this visit. There is no height or weight on file to calculate BMI.  Physical Exam Constitutional:      General: He is not in acute distress.    Appearance: He is normal weight. He is not toxic-appearing.  HENT:     Head: Normocephalic and atraumatic.     Right Ear: External ear normal.     Left Ear: External ear normal.     Nose: No congestion or rhinorrhea.     Mouth/Throat:     Mouth: Mucous membranes are moist.     Pharynx: Oropharynx is clear.  Eyes:     Extraocular Movements: Extraocular movements intact.     Conjunctiva/sclera: Conjunctivae normal.     Pupils: Pupils are equal, round, and reactive to light.  Cardiovascular:     Rate and Rhythm: Normal rate and regular rhythm.     Heart sounds: No murmur heard.    No friction rub. No gallop.  Pulmonary:     Effort: Pulmonary effort is normal.     Breath sounds: Normal breath sounds.  Abdominal:     General: Abdomen is flat. Bowel sounds are normal.     Palpations: Abdomen is soft.  Musculoskeletal:        General: No swelling. Normal range of motion.     Cervical back: Normal range of motion and neck supple.  Skin:    General: Skin is warm and dry.  Neurological:     General: No focal deficit present.     Mental Status: He is oriented to person, place, and time.  Psychiatric:        Mood and Affect: Mood normal.    Left leg wound Lab Results Lab Results  Component Value Date   WBC 5.8 03/15/2024   HGB 11.7 (L) 03/15/2024   HCT 35.6 (L) 03/15/2024   MCV 90.4 03/15/2024   PLT 314 03/15/2024    Lab Results  Component Value  Date   CREATININE 1.42 (H) 03/15/2024   BUN 19 03/15/2024   NA 138 03/15/2024   K 4.1 03/15/2024   CL 100 03/15/2024   CO2 29 03/15/2024    Lab Results  Component Value Date   ALT 21 03/08/2024   AST 11 (L) 03/08/2024   ALKPHOS 50 03/08/2024   BILITOT 0.7 03/08/2024    No results found for: "CHOL", "HDL", "LDLCALC", "LDLDIRECT", "TRIG", "CHOLHDL" No results found for: "LABRPR", "RPRTITER" No results found for: "HIV1RNAQUANT", "HIV1RNAVL", "CD4TABS"   Problem List Items Addressed This Visit   None  Results   Assessment/Plan 53YM with large left calf ulcer with cellulitis and abscess in subcutaneous tissue per mri s/p I x D and wound vac placement on 5/15 - Debridement to bone concern for osteomyelitis, cultures growing Enterobacter cloacae sensitive to Bactrim and ciprofloxacin.  Patient discharged on cefepime  x 6 weeks EOT 04/26/2024 - Seen by orthopedics per patient on 5/28 gait, noted no surrounding erythema.  - Continue cefepime  till 7/2 - PICC is in place - Follow-up in 6/30 #Medication management #PICC line -  5/29 CRP 12.3, ESR 32, WBC 7, creatinine 1.13.  Will try inflammatory markers as CRP  elevated   #DM A1c 7 on 03/07/2024    Orlie Bjornstad, MD [facility] 03/28/2024, 10:18 AM   I have personally spent 52 minutes involved in face-to-face and non-face-to-face activities for this patient on the day of the visit. Professional time spent includes the following activities: Preparing to see the patient (review of tests), Obtaining and/or reviewing separately obtained history (admission/discharge record), Performing a medically appropriate examination and/or evaluation , Ordering  medications/tests/procedures, referring and communicating with other health care professionals, Documenting clinical information in the EMR, Independently interpreting results (not separately reported), Communicating results to the patient/family/caregiver, Counseling and educating the patient/family/caregiver and Care coordination (not separately reported).

## 2024-03-29 LAB — COMPLETE METABOLIC PANEL WITHOUT GFR
AG Ratio: 1.7 (calc) (ref 1.0–2.5)
ALT: 23 U/L (ref 9–46)
AST: 13 U/L (ref 10–35)
Albumin: 4.3 g/dL (ref 3.6–5.1)
Alkaline phosphatase (APISO): 73 U/L (ref 35–144)
BUN/Creatinine Ratio: 14 (calc) (ref 6–22)
BUN: 19 mg/dL (ref 7–25)
CO2: 25 mmol/L (ref 20–32)
Calcium: 9.3 mg/dL (ref 8.6–10.3)
Chloride: 104 mmol/L (ref 98–110)
Creat: 1.37 mg/dL — ABNORMAL HIGH (ref 0.70–1.30)
Globulin: 2.5 g/dL (ref 1.9–3.7)
Glucose, Bld: 137 mg/dL — ABNORMAL HIGH (ref 65–99)
Potassium: 4.4 mmol/L (ref 3.5–5.3)
Sodium: 137 mmol/L (ref 135–146)
Total Bilirubin: 0.6 mg/dL (ref 0.2–1.2)
Total Protein: 6.8 g/dL (ref 6.1–8.1)

## 2024-03-29 LAB — CBC WITH DIFFERENTIAL/PLATELET
Absolute Lymphocytes: 1202 {cells}/uL (ref 850–3900)
Absolute Monocytes: 552 {cells}/uL (ref 200–950)
Basophils Absolute: 80 {cells}/uL (ref 0–200)
Basophils Relative: 0.9 %
Eosinophils Absolute: 142 {cells}/uL (ref 15–500)
Eosinophils Relative: 1.6 %
HCT: 39.5 % (ref 38.5–50.0)
Hemoglobin: 13.1 g/dL — ABNORMAL LOW (ref 13.2–17.1)
MCH: 29.6 pg (ref 27.0–33.0)
MCHC: 33.2 g/dL (ref 32.0–36.0)
MCV: 89.2 fL (ref 80.0–100.0)
MPV: 8.8 fL (ref 7.5–12.5)
Monocytes Relative: 6.2 %
Neutro Abs: 6924 {cells}/uL (ref 1500–7800)
Neutrophils Relative %: 77.8 %
Platelets: 359 10*3/uL (ref 140–400)
RBC: 4.43 10*6/uL (ref 4.20–5.80)
RDW: 13.4 % (ref 11.0–15.0)
Total Lymphocyte: 13.5 %
WBC: 8.9 10*3/uL (ref 3.8–10.8)

## 2024-03-29 LAB — SEDIMENTATION RATE: Sed Rate: 9 mm/h (ref 0–20)

## 2024-03-29 LAB — C-REACTIVE PROTEIN: CRP: 9.2 mg/L — ABNORMAL HIGH (ref <8.0)

## 2024-03-30 ENCOUNTER — Ambulatory Visit (INDEPENDENT_AMBULATORY_CARE_PROVIDER_SITE_OTHER): Admitting: Orthopedic Surgery

## 2024-03-30 ENCOUNTER — Encounter: Payer: Self-pay | Admitting: Orthopedic Surgery

## 2024-03-30 DIAGNOSIS — L97924 Non-pressure chronic ulcer of unspecified part of left lower leg with necrosis of bone: Secondary | ICD-10-CM

## 2024-03-30 DIAGNOSIS — L03116 Cellulitis of left lower limb: Secondary | ICD-10-CM

## 2024-03-30 NOTE — Progress Notes (Signed)
 Office Visit Note   Patient: Joshua Conner           Date of Birth: 06/28/1971           MRN: [id number] Visit Date: 03/30/2024              Requested by: Jimmey Mould, MD [address] [place],  [place] [address] PCP: Jimmey Mould, MD  Chief Complaint  Patient presents with   Left Leg - Routine Post Op     03/10/2024 and  03/15/2024 I&D left calf abscess        HPI: Patient is a 53 year old gentleman who is status post irrigation debridement left calf abscess on May 16 and May 21.  Patient is 2 weeks out.  He is currently using Vashe dressing changes.  He has a PICC line managed by infectious disease.  Assessment & Plan: Visit Diagnoses:  1. Cellulitis of left lower extremity   2. 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  Continue Vashe dressing changes.  Will apply for benefits for in office Kerecis tissue graft.  The ulcer measures 10 x 4 cm and is 5 mm deep.  Follow-Up Instructions: Return in about 2 weeks (around 04/13/2024).   Ortho Exam  Patient is alert, oriented, no adenopathy, well-dressed, normal affect, normal respiratory effort. Examination the proximal and distal aspects of the wounds are well-approximated.  After debridement there is approximately 50% granulation tissue 50% fibrinous exudative tissue.  Patient has significant swelling there is no cellulitis discussed the importance of elevation.  Possible tissue grafting at follow-up and suture harvest.    Imaging: No results found.   Labs: Lab Results  Component Value Date   HGBA1C 7.0 (H) 03/07/2024   HGBA1C 6.3 (H) 02/15/2024   ESRSEDRATE 9 03/28/2024   CRP 9.2 (H) 03/28/2024   CRP 8.5 (H) 02/16/2024   CRP 9.8 (H) 02/15/2024   REPTSTATUS 03/15/2024 FINAL 03/10/2024   GRAMSTAIN  03/10/2024    FEW WBC PRESENT, PREDOMINANTLY PMN RARE GRAM NEGATIVE RODS     CULT  03/10/2024    FEW ENTEROBACTER CLOACAE NO ANAEROBES ISOLATED Performed at [facility], [address], [place], [place] [address]    LABORGA ENTEROBACTER CLOACAE 03/10/2024     Lab Results  Component Value Date   ALBUMIN 3.3 (L) 03/08/2024   ALBUMIN 4.0 03/07/2024   ALBUMIN 3.9 02/14/2024    Lab Results  Component Value Date   MG 2.0 03/08/2024   MG 2.1 02/16/2024   MG 2.2 02/15/2024   No results found for: "VD25OH"  No results found for: "PREALBUMIN"    Latest Ref Rng & Units 03/28/2024    3:05 AM 03/15/2024    3:32 AM 03/14/2024    1:11 AM  CBC EXTENDED  WBC 3.8 - 10.8 Thousand/uL 8.9  5.8  6.1   RBC 4.20 - 5.80 Million/uL 4.43  3.94  3.82   Hemoglobin 13.2 - 17.1 g/dL 82.9  56.2  13.0   HCT 38.5 - 50.0 % 39.5  35.6  34.7   Platelets 140 - 400 Thousand/uL 359  314  311   NEUT# 1,500 - 7,800 cells/uL 6,924        There is no height or weight on file to calculate BMI.  Orders:  No orders of the defined types were placed in this encounter.  No  orders of the defined types were placed in this encounter.    Procedures: No procedures performed  Clinical Data: No additional findings.  ROS:  All other systems negative, except as noted in the HPI. Review of Systems  Objective: Vital Signs: There were no vitals taken for this visit.  Specialty Comments:  No specialty comments available.  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) 03/10/2024   History of seizure disorder 03/08/2024   CKD stage 3a, GFR 45-59 ml/min (HCC) 03/08/2024   Sepsis (HCC) 03/08/2024   Obesity, Class III, BMI 40-49.9 (morbid obesity) 03/08/2024   OSA (obstructive sleep apnea) 03/08/2024   Cellulitis 03/07/2024   Controlled type 2 diabetes mellitus without complication, without long-term current use of insulin  (HCC) 02/18/2024   Hyperlipidemia 02/18/2024    Cellulitis of left lower extremity 02/14/2024   Aftercare following surgery of the circulatory system 08/28/2014   Varicose veins of leg with complications 08/07/2014   Varicose veins of bilateral lower extremities with other complications 05/01/2014   Past Medical History:  Diagnosis Date   Acid reflux    Diabetes mellitus without complication (HCC)    Seizures (HCC)    Varicose veins     Family History  Problem Relation Age of Onset   Hypertension Mother    COPD Mother    Cancer Mother 66       breast   Hypertension Father    Hypertension Sister     Past Surgical History:  Procedure Laterality Date   APPLICATION OF WOUND VAC Left 03/10/2024   Procedure: APPLICATION, WOUND VAC;  Surgeon: Timothy Ford, MD;  Location: [facility] OR;  Service: Orthopedics;  Laterality: Left;   HERNIA REPAIR     INCISION AND DRAINAGE OF DEEP ABSCESS, CALF Left 03/10/2024   Procedure: LEFT CALF DEBRIDEMENT;  Surgeon: Timothy Ford, MD;  Location: [facility] OR;  Service: Orthopedics;  Laterality: Left;  LEFT LEG DEBRIDEMENT   INCISION AND DRAINAGE OF DEEP ABSCESS, CALF Left 03/15/2024   Procedure: INCISION AND DRAINAGE OF DEEP ABSCESS, CALF;  Surgeon: Timothy Ford, MD;  Location: [facility] OR;  Service: Orthopedics;  Laterality: Left;  DEBRIDEMENT LEFT LEG   INGUINAL HERNIA REPAIR Left 02/04/2015   Procedure: LAPAROSCOPIC LEFT  INGUINAL HERNIA REPAIR WITH MESH;  Surgeon: Shela Derby, MD;  Location: [facility] ORS;  Service: General;  Laterality: Left;   INSERTION OF MESH Left 02/04/2015   Procedure: INSERTION OF MESH;  Surgeon: Shela Derby, MD;  Location: [facility];  Service: General;  Laterality: Left;   KNEE SURGERY     VARICOSE VEIN SURGERY     Social History   Occupational History   Not on file  Tobacco Use   Smoking status: Never   Smokeless tobacco: Not on file  Substance and Sexual Activity   Alcohol use: Yes    Alcohol/week: 12.0 standard drinks of alcohol    Types: 12 Cans of beer per week    Comment:  approx 6 beers a week   Drug use: No   Sexual activity: Not on file

## 2024-04-05 ENCOUNTER — Telehealth: Payer: Self-pay | Admitting: Orthopedic Surgery

## 2024-04-05 ENCOUNTER — Ambulatory Visit (INDEPENDENT_AMBULATORY_CARE_PROVIDER_SITE_OTHER): Admitting: Physician Assistant

## 2024-04-05 DIAGNOSIS — L97924 Non-pressure chronic ulcer of unspecified part of left lower leg with necrosis of bone: Secondary | ICD-10-CM

## 2024-04-05 MED ORDER — HYDROCODONE-ACETAMINOPHEN 5-325 MG PO TABS
1.0000 | ORAL_TABLET | Freq: Four times a day (QID) | ORAL | 0 refills | Status: AC | PRN
Start: 2024-04-05 — End: ?

## 2024-04-05 NOTE — Telephone Encounter (Signed)
 Patient called in requesting a call back in regards to some questions/concerns he has about his wound. Wants to know if some of what he is experiencing is normal or not. [phone]

## 2024-04-05 NOTE — Telephone Encounter (Signed)
 Called pt and made an appt for today at 1:45 concerned about pain

## 2024-04-05 NOTE — Progress Notes (Signed)
 Office Visit Note   Patient: Joshua Conner           Date of Birth: 1971/10/25           MRN: [id number] Visit Date: 04/05/2024              Requested by: Jimmey Mould, MD [address] [place],  [place] [address] PCP: Jimmey Mould, MD  Chief Complaint  Patient presents with   Left Leg - Routine Post Op    I&D of left leg x 2       HPI: 53 y/o male with chronic left LE wounds.  He is s/p irrigation debridement left calf abscess on May 16 and May 21.  He was discharged with a PICC line and IV antibiotics managed by infectious disease.  He has had increased pain in the whole leg for the past 3 days.  He took his last pain tablet to help him sleep last night.  He got about 3 hours of sleep.He is here for follow up exam.  He denies fever and chills.  Assessment & Plan: Visit Diagnoses:  1. 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  Wet to dry Vashe dressing changes daily.  Elevation in the supine position with the left LE above the level of the heart.  He was given a vein hand out to demonstrate the proper way to elevate.  I will give him Norco # 20 instead of percocet to try for night time   Follow-Up Instructions: Return in about 12 days (around 04/17/2024).   Ortho Exam  Patient is alert, oriented, no adenopathy, well-dressed, normal affect, normal respiratory effort. The left leg moderate edema.  The leg wound has 95% granulatio tissue with 5 % yellow fibrinous tissue at the edges.  4 x 4 debridement and cleansed with Vashe.       Imaging: No results found. No images are attached to the encounter.  Labs: Lab Results  Component Value Date   HGBA1C 7.0 (H) 03/07/2024   HGBA1C 6.3 (H) 02/15/2024   ESRSEDRATE 9 03/28/2024   CRP 9.2 (H) 03/28/2024   CRP 8.5 (H) 02/16/2024   CRP 9.8 (H) 02/15/2024   REPTSTATUS 03/15/2024 FINAL 03/10/2024   GRAMSTAIN   03/10/2024    FEW WBC PRESENT, PREDOMINANTLY PMN RARE GRAM NEGATIVE RODS    CULT  03/10/2024    FEW ENTEROBACTER CLOACAE NO ANAEROBES ISOLATED Performed at [facility], [address] [place], [place] [address]    LABORGA ENTEROBACTER CLOACAE 03/10/2024     Lab Results  Component Value Date   ALBUMIN 3.3 (L) 03/08/2024   ALBUMIN 4.0 03/07/2024   ALBUMIN 3.9 02/14/2024    Lab Results  Component Value Date   MG 2.0 03/08/2024   MG 2.1 02/16/2024   MG 2.2 02/15/2024   No results found for: VD25OH  No results found for: PREALBUMIN    Latest Ref Rng & Units 03/28/2024    3:05 AM 03/15/2024    3:32 AM 03/14/2024    1:11 AM  CBC EXTENDED  WBC 3.8 - 10.8 Thousand/uL 8.9  5.8  6.1   RBC 4.20 - 5.80 Million/uL 4.43  3.94  3.82   Hemoglobin 13.2 - 17.1 g/dL 82.9  56.2  13.0   HCT 38.5 - 50.0 % 39.5  35.6  34.7  Platelets 140 - 400 Thousand/uL 359  314  311   NEUT# 1,500 - 7,800 cells/uL 6,924        There is no height or weight on file to calculate BMI.  Orders:  No orders of the defined types were placed in this encounter.  Meds ordered this encounter  Medications   HYDROcodone -acetaminophen  (NORCO/VICODIN) 5-325 MG tablet    Sig: Take 1 tablet by mouth every 6 (six) hours as needed for moderate pain (pain score 4-6).    Dispense:  20 tablet    Refill:  0    Supervising Provider:   DUDA, MARCUS V [[id number]]     Procedures: No procedures performed  Clinical Data: No additional findings.  ROS:  All other systems negative, except as noted in the HPI. Review of Systems  Objective: Vital Signs: There were no vitals taken for this visit.  Specialty Comments:  No specialty comments available.  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) 03/10/2024   History of seizure disorder 03/08/2024   CKD stage 3a, GFR 45-59 ml/min (HCC) 03/08/2024    Sepsis (HCC) 03/08/2024   Obesity, Class III, BMI 40-49.9 (morbid obesity) 03/08/2024   OSA (obstructive sleep apnea) 03/08/2024   Cellulitis 03/07/2024   Controlled type 2 diabetes mellitus without complication, without long-term current use of insulin  (HCC) 02/18/2024   Hyperlipidemia 02/18/2024   Cellulitis of left lower extremity 02/14/2024   Aftercare following surgery of the circulatory system 08/28/2014   Varicose veins of leg with complications 08/07/2014   Varicose veins of bilateral lower extremities with other complications 05/01/2014   Past Medical History:  Diagnosis Date   Acid reflux    Diabetes mellitus without complication (HCC)    Seizures (HCC)    Varicose veins     Family History  Problem Relation Age of Onset   Hypertension Mother    COPD Mother    Cancer Mother 82       breast   Hypertension Father    Hypertension Sister     Past Surgical History:  Procedure Laterality Date   APPLICATION OF WOUND VAC Left 03/10/2024   Procedure: APPLICATION, WOUND VAC;  Surgeon: Timothy Ford, MD;  Location: [facility] OR;  Service: Orthopedics;  Laterality: Left;   HERNIA REPAIR     INCISION AND DRAINAGE OF DEEP ABSCESS, CALF Left 03/10/2024   Procedure: LEFT CALF DEBRIDEMENT;  Surgeon: Timothy Ford, MD;  Location: [facility] OR;  Service: Orthopedics;  Laterality: Left;  LEFT LEG DEBRIDEMENT   INCISION AND DRAINAGE OF DEEP ABSCESS, CALF Left 03/15/2024   Procedure: INCISION AND DRAINAGE OF DEEP ABSCESS, CALF;  Surgeon: Timothy Ford, MD;  Location: [facility] OR;  Service: Orthopedics;  Laterality: Left;  DEBRIDEMENT LEFT LEG   INGUINAL HERNIA REPAIR Left 02/04/2015   Procedure: LAPAROSCOPIC LEFT  INGUINAL HERNIA REPAIR WITH MESH;  Surgeon: Shela Derby, MD;  Location: [facility];  Service: General;  Laterality: Left;   INSERTION OF MESH Left 02/04/2015   Procedure: INSERTION OF MESH;  Surgeon: Shela Derby, MD;  Location: [facility];  Service: General;  Laterality: Left;   KNEE SURGERY      VARICOSE VEIN SURGERY     Social History   Occupational History   Not on file  Tobacco Use   Smoking status: Never   Smokeless tobacco: Not on file  Substance and Sexual Activity  Alcohol use: Yes    Alcohol/week: 12.0 standard drinks of alcohol    Types: 12 Cans of beer per week    Comment: approx 6 beers a week   Drug use: No   Sexual activity: Not on file

## 2024-04-13 ENCOUNTER — Encounter: Payer: Self-pay | Admitting: Orthopedic Surgery

## 2024-04-13 ENCOUNTER — Other Ambulatory Visit: Payer: Self-pay

## 2024-04-17 ENCOUNTER — Encounter: Payer: Self-pay | Admitting: Orthopedic Surgery

## 2024-04-17 ENCOUNTER — Ambulatory Visit (INDEPENDENT_AMBULATORY_CARE_PROVIDER_SITE_OTHER): Admitting: Orthopedic Surgery

## 2024-04-17 DIAGNOSIS — L97924 Non-pressure chronic ulcer of unspecified part of left lower leg with necrosis of bone: Secondary | ICD-10-CM

## 2024-04-17 NOTE — Progress Notes (Signed)
 Office Visit Note   Patient: Joshua Conner           Date of Birth: 1971/07/06           MRN: [id number] Visit Date: 04/17/2024              Requested by: Okey Carlin Redbird, MD [address] [place],  [place] [address] PCP: Okey Carlin Redbird, MD  Chief Complaint  Patient presents with   Left Leg - Routine Post Op    03/10/2024 and 03/15/2024 I&D of left leg x 2         HPI: Patient is a 53 year old gentleman who is 4 weeks status post irrigation debridement ulceration left lower extremity.  We have attempted to continue with tissue grafting in the office.  An appeal has been sent to [employer] for an office Kerecis tissue graft.  Assessment & Plan: Visit Diagnoses:  1. 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  Patient will continue using Vashe for dressing changes with compression.  Recommended elevation.  Follow-Up Instructions: Return in about 2 weeks (around 05/01/2024).   Ortho Exam  Patient is alert, oriented, no adenopathy, well-dressed, normal affect, normal respiratory effort. Examination the wound bed has hypergranulation tissue approximately 90% granulation tissue with 10% fibrinous exudative tissue.  This was debrided in the office.  The wound measures 5 x 9.5 cm.  Patient does have swelling in the left lower extremity.    Imaging: No results found.    Labs: Lab Results  Component Value Date   HGBA1C 7.0 (H) 03/07/2024   HGBA1C 6.3 (H) 02/15/2024   ESRSEDRATE 9 03/28/2024   CRP 9.2 (H) 03/28/2024   CRP 8.5 (H) 02/16/2024   CRP 9.8 (H) 02/15/2024   REPTSTATUS 03/15/2024 FINAL 03/10/2024   GRAMSTAIN  03/10/2024    FEW WBC PRESENT, PREDOMINANTLY PMN RARE GRAM NEGATIVE RODS    CULT  03/10/2024    FEW ENTEROBACTER CLOACAE NO ANAEROBES ISOLATED Performed at [facility], [address], [place], [place] [address]    LABORGA  ENTEROBACTER CLOACAE 03/10/2024     Lab Results  Component Value Date   ALBUMIN 3.3 (L) 03/08/2024   ALBUMIN 4.0 03/07/2024   ALBUMIN 3.9 02/14/2024    Lab Results  Component Value Date   MG 2.0 03/08/2024   MG 2.1 02/16/2024   MG 2.2 02/15/2024   No results found for: VD25OH  No results found for: PREALBUMIN    Latest Ref Rng & Units 03/28/2024    3:05 AM 03/15/2024    3:32 AM 03/14/2024    1:11 AM  CBC EXTENDED  WBC 3.8 - 10.8 Thousand/uL 8.9  5.8  6.1   RBC 4.20 - 5.80 Million/uL 4.43  3.94  3.82   Hemoglobin 13.2 - 17.1 g/dL 86.8  88.2  88.8   HCT 38.5 - 50.0 % 39.5  35.6  34.7   Platelets 140 - 400 Thousand/uL 359  314  311   NEUT# 1,500 - 7,800 cells/uL 6,924        There is no height or weight on file to calculate BMI.  Orders:  No orders of the defined types were placed in this encounter.  No orders of the defined types were placed in this encounter.    Procedures: No procedures performed  Clinical Data: No additional findings.  ROS:  All other systems negative, except as noted in the HPI. Review of Systems  Objective: Vital Signs: There were no vitals taken for this visit.  Specialty Comments:  No specialty comments available.  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) 03/10/2024   History of seizure disorder 03/08/2024   CKD stage 3a, GFR 45-59 ml/min (HCC) 03/08/2024   Sepsis (HCC) 03/08/2024   Obesity, Class III, BMI 40-49.9 (morbid obesity) 03/08/2024   OSA (obstructive sleep apnea) 03/08/2024   Cellulitis 03/07/2024   Controlled type 2 diabetes mellitus without complication, without long-term current use of insulin  (HCC) 02/18/2024   Hyperlipidemia 02/18/2024   Cellulitis of left lower extremity 02/14/2024   Aftercare following surgery of the circulatory system 08/28/2014   Varicose veins of leg with complications  08/07/2014   Varicose veins of bilateral lower extremities with other complications 05/01/2014   Past Medical History:  Diagnosis Date   Acid reflux    Diabetes mellitus without complication (HCC)    Seizures (HCC)    Varicose veins     Family History  Problem Relation Age of Onset   Hypertension Mother    COPD Mother    Cancer Mother 54       breast   Hypertension Father    Hypertension Sister     Past Surgical History:  Procedure Laterality Date   APPLICATION OF WOUND VAC Left 03/10/2024   Procedure: APPLICATION, WOUND VAC;  Surgeon: Harden Jerona GAILS, MD;  Location: [facility] OR;  Service: Orthopedics;  Laterality: Left;   HERNIA REPAIR     INCISION AND DRAINAGE OF DEEP ABSCESS, CALF Left 03/10/2024   Procedure: LEFT CALF DEBRIDEMENT;  Surgeon: Harden Jerona GAILS, MD;  Location[facility];  Service: Orthopedics;  Laterality: Left;  LEFT LEG DEBRIDEMENT   INCISION AND DRAINAGE OF DEEP ABSCESS, CALF Left 03/15/2024   Procedure: INCISION AND DRAINAGE OF DEEP ABSCESS, CALF;  Surgeon: Harden Jerona GAILS, MD;  Location: [facility] OR;  Service: Orthopedics;  Laterality: Left;  DEBRIDEMENT LEFT LEG   INGUINAL HERNIA REPAIR Left 02/04/2015   Procedure: LAPAROSCOPIC LEFT  INGUINAL HERNIA REPAIR WITH MESH;  Surgeon: Lynda Leos, MD;  Location: [facility] ORS;  Service: General;  Laterality: Left;   INSERTION OF MESH Left 02/04/2015   Procedure: INSERTION OF MESH;  Surgeon: Lynda Leos, MD;  Location: [facility];  Service: General;  Laterality: Left;   KNEE SURGERY     VARICOSE VEIN SURGERY     Social History   Occupational History   Not on file  Tobacco Use   Smoking status: Never   Smokeless tobacco: Not on file  Substance and Sexual Activity   Alcohol use: Yes    Alcohol/week: 12.0 standard drinks of alcohol    Types: 12 Cans of beer per week    Comment: approx 6 beers a week   Drug use: No   Sexual activity: Not on file

## 2024-04-20 ENCOUNTER — Other Ambulatory Visit: Payer: Self-pay | Admitting: Orthopedic Surgery

## 2024-04-21 ENCOUNTER — Ambulatory Visit (INDEPENDENT_AMBULATORY_CARE_PROVIDER_SITE_OTHER): Admitting: Family

## 2024-04-21 ENCOUNTER — Encounter: Payer: Self-pay | Admitting: Family

## 2024-04-21 DIAGNOSIS — L03116 Cellulitis of left lower limb: Secondary | ICD-10-CM

## 2024-04-21 DIAGNOSIS — L97924 Non-pressure chronic ulcer of unspecified part of left lower leg with necrosis of bone: Secondary | ICD-10-CM | POA: Diagnosis not present

## 2024-04-21 NOTE — Progress Notes (Signed)
 Office Visit Note   Patient: Joshua Conner           Date of Birth: 02-15-1971           MRN: [id number] Visit Date: 04/21/2024              Requested by: Okey Carlin Redbird, MD [address] [place],  [place] [address] PCP: Okey Carlin Redbird, MD  Chief Complaint  Patient presents with   Left Leg - Routine Post Op    03/10/2024 and 03/15/2024 I&D of left leg x 2      HPI: Patient is a 53 year old gentleman who is 4 weeks status post irrigation debridement ulceration left lower extremity.  We have attempted to continue with tissue grafting in the office.  An appeal has been sent to [employer] for an office Kerecis tissue graft.  Assessment & Plan: Visit Diagnoses:  No diagnosis found.   Plan: Kerecis applied today.  Scaffold dressing applied.  Patient aware of home wound care instructions.  Follow-Up Instructions: No follow-ups on file.   Ortho Exam  Patient is alert, oriented, no adenopathy, well-dressed, normal affect, normal respiratory effort. Examination the wound bed has hypergranulation tissue approximately 95% granulation tissue with 10% fibrinous exudative tissue.  This was debrided in the office.  The wound measures 4.8 x 9.8 cm with 5 mm of depth.  Patient does have swelling in the left lower extremity.  The wound healing has stalled, the wound bed has healthy granulation tissue, and patient presents for evaluation and application of Kerecis MariGen Micro graft. After informed consent a 10 blade knife was used to debride the skin and soft tissue to healthy viable bleeding granulation tissue.  Silver nitrate was used for hemostasis. The wound measures: 9.8 cm in length, 4.8cm  in width, 5 mm in depth, wound location left anterior lower leg Kerecis MariGen micro tissue graft 38 cm2 (19cm2 x 2) was applied, and there was no wastage.  Please see the photo below of the Lot number and expiration date. The micro tissue graft was covered with a nonadherent Adaptic  dressing, bolstered with 4 x 4 gauze and secured with a compression wrap.           Imaging: No results found.    Labs: Lab Results  Component Value Date   HGBA1C 7.0 (H) 03/07/2024   HGBA1C 6.3 (H) 02/15/2024   ESRSEDRATE 9 03/28/2024   CRP 9.2 (H) 03/28/2024   CRP 8.5 (H) 02/16/2024   CRP 9.8 (H) 02/15/2024   REPTSTATUS 03/15/2024 FINAL 03/10/2024   GRAMSTAIN  03/10/2024    FEW WBC PRESENT, PREDOMINANTLY PMN RARE GRAM NEGATIVE RODS    CULT  03/10/2024    FEW ENTEROBACTER CLOACAE NO ANAEROBES ISOLATED Performed at [facility], [address], [place], [place] [address]    LABORGA ENTEROBACTER CLOACAE 03/10/2024     Lab Results  Component Value Date   ALBUMIN 3.3 (L) 03/08/2024   ALBUMIN 4.0 03/07/2024   ALBUMIN 3.9 02/14/2024    Lab Results  Component Value Date   MG 2.0 03/08/2024   MG 2.1 02/16/2024   MG 2.2 02/15/2024   No results found for: VD25OH  No results found for: PREALBUMIN    Latest Ref Rng & Units 03/28/2024    3:05 AM 03/15/2024    3:32 AM 03/14/2024    1:11 AM  CBC EXTENDED  WBC 3.8 - 10.8 Thousand/uL 8.9  5.8  6.1   RBC 4.20 - 5.80  Million/uL 4.43  3.94  3.82   Hemoglobin 13.2 - 17.1 g/dL 86.8  88.2  88.8   HCT 38.5 - 50.0 % 39.5  35.6  34.7   Platelets 140 - 400 Thousand/uL 359  314  311   NEUT# 1,500 - 7,800 cells/uL 6,924        There is no height or weight on file to calculate BMI.  Orders:  No orders of the defined types were placed in this encounter.  No orders of the defined types were placed in this encounter.    Procedures: No procedures performed  Clinical Data: No additional findings.  ROS:  All other systems negative, except as noted in the HPI. Review of Systems  Constitutional: Negative.   Skin:  Positive for wound. Negative for color change.    Objective: Vital Signs: There were no vitals taken for this visit.  Specialty Comments:  No specialty comments available.  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) 03/10/2024   History of seizure disorder 03/08/2024   CKD stage 3a, GFR 45-59 ml/min (HCC) 03/08/2024   Sepsis (HCC) 03/08/2024   Obesity, Class III, BMI 40-49.9 (morbid obesity) 03/08/2024   OSA (obstructive sleep apnea) 03/08/2024   Cellulitis 03/07/2024   Controlled type 2 diabetes mellitus without complication, without long-term current use of insulin  (HCC) 02/18/2024   Hyperlipidemia 02/18/2024   Cellulitis of left lower extremity 02/14/2024   Aftercare following surgery of the circulatory system 08/28/2014   Varicose veins of leg with complications 08/07/2014   Varicose veins of bilateral lower extremities with other complications 05/01/2014   Past Medical History:  Diagnosis Date   Acid reflux    Diabetes mellitus without complication (HCC)    Seizures (HCC)    Varicose veins     Family History  Problem Relation Age of Onset   Hypertension Mother    COPD Mother    Cancer Mother 24       breast   Hypertension Father    Hypertension Sister     Past Surgical History:  Procedure Laterality Date   APPLICATION OF WOUND VAC Left 03/10/2024   Procedure: APPLICATION, WOUND VAC;  Surgeon: Harden Jerona GAILS, MD;  Location: [facility] OR;  Service: Orthopedics;  Laterality: Left;   HERNIA REPAIR     INCISION AND DRAINAGE OF DEEP ABSCESS, CALF Left 03/10/2024   Procedure: LEFT CALF DEBRIDEMENT;  Surgeon: Harden Jerona GAILS, MD;  Location: [facility] OR;  Service: Orthopedics;  Laterality: Left;  LEFT LEG DEBRIDEMENT   INCISION AND DRAINAGE OF DEEP ABSCESS, CALF Left 03/15/2024   Procedure: INCISION AND DRAINAGE OF DEEP ABSCESS, CALF;  Surgeon: Harden Jerona GAILS, MD;  Location: [facility] OR;  Service: Orthopedics;  Laterality: Left;  DEBRIDEMENT LEFT LEG   INGUINAL HERNIA REPAIR Left 02/04/2015   Procedure: LAPAROSCOPIC LEFT  INGUINAL HERNIA REPAIR WITH MESH;   Surgeon: Lynda Leos, MD;  Location: [facility];  Service: General;  Laterality: Left;   INSERTION OF MESH Left 02/04/2015   Procedure: INSERTION OF MESH;  Surgeon: Lynda Leos, MD;  Location: [facility];  Service: General;  Laterality: Left;   KNEE SURGERY     VARICOSE VEIN SURGERY     Social History   Occupational History   Not on file  Tobacco Use   Smoking status: Never   Smokeless tobacco: Not on file  Substance and Sexual Activity  Alcohol use: Yes    Alcohol/week: 12.0 standard drinks of alcohol    Types: 12 Cans of beer per week    Comment: approx 6 beers a week   Drug use: No   Sexual activity: Not on file

## 2024-04-24 ENCOUNTER — Ambulatory Visit: Payer: Self-pay | Admitting: Internal Medicine

## 2024-04-24 ENCOUNTER — Encounter: Payer: Self-pay | Admitting: Internal Medicine

## 2024-04-24 ENCOUNTER — Other Ambulatory Visit: Payer: Self-pay

## 2024-04-24 ENCOUNTER — Other Ambulatory Visit: Payer: Self-pay | Admitting: Orthopedic Surgery

## 2024-04-24 ENCOUNTER — Telehealth: Payer: Self-pay

## 2024-04-24 VITALS — BP 120/83 | HR 82 | Temp 97.7°F | Ht 73.0 in | Wt 299.0 lb

## 2024-04-24 DIAGNOSIS — L97924 Non-pressure chronic ulcer of unspecified part of left lower leg with necrosis of bone: Secondary | ICD-10-CM | POA: Diagnosis not present

## 2024-04-24 MED ORDER — CIPROFLOXACIN HCL 500 MG PO TABS: 500.0000 mg | ORAL_TABLET | Freq: Two times a day (BID) | ORAL | 0 refills | Status: AC

## 2024-04-24 NOTE — Progress Notes (Unsigned)
 Patient Active Problem List   Diagnosis Date Noted  . Osteomyelitis (HCC) 03/14/2024  . 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 History of seizure disorder 03/08/2024  . CKD stage 3a, GFR 45-59 ml/min (HCC) 03/08/2024  . Sepsis (HCC) 03/08/2024  . Obesity, Class III, BMI 40-49.9 (morbid obesity) 03/08/2024  . OSA (obstructive sleep apnea) 03/08/2024  . Cellulitis 03/07/2024  . Controlled type 2 diabetes mellitus without complication, without long-term current use of insulin  (HCC) 02/18/2024  . Hyperlipidemia 02/18/2024  . Cellulitis of left lower extremity 02/14/2024  . Aftercare following surgery of the circulatory system 08/28/2014  . Varicose veins of leg with complications 08/07/2014  . Varicose veins of bilateral lower extremities with other complications 05/01/2014    Patient's Medications  New Prescriptions   No medications on file  Previous Medications   CEFEPIME  (MAXIPIME ) IVPB    Inject 2 g into the vein every 8 (eight) hours. Indication:  Left Leg Osteomyelitis  First Dose: Yes Last Day of Therapy:  04/26/24 Labs - Once weekly:  CBC/D and BMP, Labs - Once weekly: ESR and CRP Method of administration: IV Push Method of administration may be changed at the discretion of home infusion pharmacist based upon assessment of the patient and/or caregiver's ability to self-administer the medication ordered.   GABAPENTIN  (NEURONTIN ) 100 MG CAPSULE    TAKE 1 CAPSULE BY MOUTH THREE TIMES A DAY   HYDROCODONE -ACETAMINOPHEN  (NORCO/VICODIN) 5-325 MG TABLET    Take 1 tablet by mouth every 6 (six) hours as needed for moderate pain (pain score 4-6).   JARDIANCE  25 MG TABS TABLET    Take 25 mg by mouth in the morning.   LAMOTRIGINE  250 MG TB24 24 HOUR TABLET    Take 250 mg by mouth in the morning.   METHOCARBAMOL  (ROBAXIN ) 500 MG TABLET    Take 1 tablet (500 mg total) by mouth every 6 (six) hours as needed for muscle spasms.   OXYCODONE  (OXY  IR/ROXICODONE ) 5 MG IMMEDIATE RELEASE TABLET    Take 1 tablet (5 mg total) by mouth every 4 (four) hours as needed for severe pain (pain score 7-10).   PANTOPRAZOLE  (PROTONIX ) 40 MG TABLET    Take 40 mg by mouth daily before breakfast.   POLYETHYLENE GLYCOL POWDER (GLYCOLAX /MIRALAX ) 17 GM/SCOOP POWDER    Take 17 g by mouth daily as needed.   ROSUVASTATIN  (CRESTOR ) 10 MG TABLET    Take 10 mg by mouth in the morning.   SENNA-DOCUSATE (SENOKOT-S) 8.6-50 MG TABLET    Take 2 tablets by mouth at bedtime.   SOFT LENS PRODUCTS (CLEAR EYES CONTACT LENS RELIEF) SOLN    Place 1 drop into both eyes as needed (for dry eyes). For rewetting contact lenses.   TYLENOL  500 MG TABLET    Take 1,000 mg by mouth every 6 (six) hours as needed (for pain).  Modified Medications   No medications on file  Discontinued Medications   No medications on file    Subjective: 52 year old male with past medical history as below includes diabetes mellitus, varicose veins presents for hospital follow-up ofLeft calf ulcer with cellulitis and abscess and subcutaneous tissue per MRI status post I&D with wound VAC placement on 5/15 debridement to bone with concern for osteomyelitis cultures growing Enterobacter cloacae S sensitive to ciprofloxacin and Bactrim.  Discharged on cefepime  x 6 weeks EOT 04/26/2024 6/3: Doing well no new complaints denies fever and  chills Review of Systems: ROS  Past Medical History:  Diagnosis Date  . Acid reflux   . Diabetes mellitus without complication (HCC)   . Seizures (HCC)   . Varicose veins     Social History   Tobacco Use  . Smoking status: Never  Substance Use Topics  . Alcohol use: Yes    Alcohol/week: 12.0 standard drinks of alcohol    Types: 12 Cans of beer per week    Comment: approx 6 beers a week  . Drug use: No    Family History  Problem Relation Age of Onset  . Hypertension Mother   . COPD Mother   . Cancer Mother 68       breast  . Hypertension Father   .  Hypertension Sister     No Known Allergies  Health Maintenance  Topic Date Due  . FOOT EXAM  Never done  . OPHTHALMOLOGY EXAM  Never done  . Diabetic kidney evaluation - Urine ACR  Never done  . Hepatitis C Screening  Never done  . DTaP/Tdap/Td (1 - Tdap) Never done  . Pneumococcal Vaccine 62-62 Years old (1 of 2 - PCV) Never done  . Hepatitis B Vaccines (1 of 3 - 19+ 3-dose series) Never done  . Colonoscopy  Never done  . Zoster Vaccines- Shingrix (1 of 2) Never done  . COVID-19 Vaccine (2 - 2024-25 season) 06/27/2023  . INFLUENZA VACCINE  05/26/2024  . HEMOGLOBIN A1C  09/07/2024  . Diabetic kidney evaluation - eGFR measurement  03/28/2025  . HIV Screening  Completed  . HPV VACCINES  Aged Out  . Meningococcal B Vaccine  Aged Out    Objective:  There were no vitals filed for this visit. There is no height or weight on file to calculate BMI.  Physical Exam Physical Exam   Lab Results Lab Results  Component Value Date   WBC 8.9 03/28/2024   HGB 13.1 (L) 03/28/2024   HCT 39.5 03/28/2024   MCV 89.2 03/28/2024   PLT 359 03/28/2024    Lab Results  Component Value Date   CREATININE 1.37 (H) 03/28/2024   BUN 19 03/28/2024   NA 137 03/28/2024   K 4.4 03/28/2024   CL 104 03/28/2024   CO2 25 03/28/2024    Lab Results  Component Value Date   ALT 23 03/28/2024   AST 13 03/28/2024   ALKPHOS 50 03/08/2024   BILITOT 0.6 03/28/2024    No results found for: CHOL, HDL, LDLCALC, LDLDIRECT, TRIG, CHOLHDL No results found for: LABRPR, RPRTITER No results found for: HIV1RNAQUANT, HIV1RNAVL, CD4TABS   Problem List Items Addressed This Visit   None  Results   Assessment/Plan 53YM with large left calf ulcer with cellulitis and abscess in subcutaneous tissue per mri s/p I x D and wound vac placement on 5/15 - Debridement to bone concern for osteomyelitis, cultures growing Enterobacter cloacae sensitive to Bactrim and ciprofloxacin.  Patient discharged  on cefepime  x 6 weeks EOT 04/26/2024 - Seen by orthopedics per patient on 6/27 noted healthy granulation itssue , although wound healing stalled - Continue cefepime  till 7/2 -  #Medication management #PICC line -  6/24 CRP 11.9(nl<5), ESR 18, WBC 5.3, creatinine 1.38.  Will try inflammatory markers as CRP  elevated  -pull picc -didsussed rpeacution if redness return start icpro and call clnic   #DM A1c 7 on 03/07/2024      Loney Stank, MD [facility] 04/24/2024, 11:17  AM

## 2024-04-24 NOTE — Telephone Encounter (Signed)
 Per Dr. Dennise patient end date to stop Iv abx and pull picc is on 7/2. Sent a message to [employer] about end date as well.

## 2024-04-27 ENCOUNTER — Encounter: Payer: Self-pay | Admitting: Orthopedic Surgery

## 2024-04-27 ENCOUNTER — Ambulatory Visit: Admitting: Orthopedic Surgery

## 2024-04-27 DIAGNOSIS — L97924 Non-pressure chronic ulcer of unspecified part of left lower leg with necrosis of bone: Secondary | ICD-10-CM

## 2024-04-27 NOTE — Progress Notes (Signed)
 Office Visit Note   Patient: Joshua Conner           Date of Birth: Nov 23, 1970           MRN: [id number] Visit Date: 04/27/2024              Requested by: Okey Carlin Redbird, MD [address] 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  Assessment & Plan: Visit Diagnoses:  1. 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  Follow-Up Instructions: Return in about 1 week (around 05/04/2024).   Ortho Exam  Patient is alert, oriented, no adenopathy, well-dressed, normal affect, normal respiratory effort. Examination the wound bed has 90% healthy granulation tissue.  After informed consent debride soft was used to debride the fibrinous exudative tissue back to bleeding viable granulation tissue.  After debridement the ulcer is 9 x 4 cm total surface area of 36 cm.  The wound healing has stalled, the wound bed has healthy granulation tissue, and patient presents for evaluation and application of Kerecis MariGen Micro graft. After informed consent a 10 blade knife was used to debride the skin and soft tissue to healthy viable bleeding granulation tissue.  Silver nitrate was used for hemostasis. The wound measures: 9 cm in length, 4cm  in width, 1 mm in depth, wound location left calf Kerecis MariGen micro tissue graft 38 with a long cm2 was applied, and there was no wastage.  Please see the photo below of the Lot number and expiration date. The micro tissue graft was covered with a nonadherent Adaptic dressing, bolstered with 4 x 4 gauze and secured with a compression wrap.       Imaging: No results found.     Labs: Lab Results   Component Value Date   HGBA1C 7.0 (H) 03/07/2024   HGBA1C 6.3 (H) 02/15/2024   ESRSEDRATE 9 03/28/2024   CRP 9.2 (H) 03/28/2024   CRP 8.5 (H) 02/16/2024   CRP 9.8 (H) 02/15/2024   REPTSTATUS 03/15/2024 FINAL 03/10/2024   GRAMSTAIN  03/10/2024    FEW WBC PRESENT, PREDOMINANTLY PMN RARE GRAM NEGATIVE RODS    CULT  03/10/2024    FEW ENTEROBACTER CLOACAE NO ANAEROBES ISOLATED Performed at [facility], [address] [place], [place] [address]    LABORGA ENTEROBACTER CLOACAE 03/10/2024     Lab Results  Component Value Date   ALBUMIN 3.3 (L) 03/08/2024   ALBUMIN 4.0 03/07/2024   ALBUMIN 3.9 02/14/2024    Lab Results  Component Value Date   MG 2.0 03/08/2024   MG 2.1 02/16/2024   MG 2.2 02/15/2024   No results found for: VD25OH  No results found for: PREALBUMIN    Latest Ref Rng & Units 03/28/2024    3:05 AM 03/15/2024    3:32 AM 03/14/2024    1:11 AM  CBC EXTENDED  WBC 3.8 - 10.8 Thousand/uL 8.9  5.8  6.1   RBC 4.20 - 5.80 Million/uL 4.43  3.94  3.82   Hemoglobin 13.2 - 17.1 g/dL 86.8  88.2  88.8   HCT  38.5 - 50.0 % 39.5  35.6  34.7   Platelets 140 - 400 Thousand/uL 359  314  311   NEUT# 1,500 - 7,800 cells/uL 6,924        There is no height or weight on file to calculate BMI.  Orders:  No orders of the defined types were placed in this encounter.  No orders of the defined types were placed in this encounter.    Procedures: No procedures performed  Clinical Data: No additional findings.  ROS:  All other systems negative, except as noted in the HPI. Review of Systems  Objective: Vital Signs: There were no vitals taken for this visit.  Specialty Comments:  No specialty comments available.  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) 03/10/2024   History of seizure disorder 03/08/2024   CKD stage 3a, GFR 45-59 ml/min (HCC)  03/08/2024   Sepsis (HCC) 03/08/2024   Obesity, Class III, BMI 40-49.9 (morbid obesity) 03/08/2024   OSA (obstructive sleep apnea) 03/08/2024   Cellulitis 03/07/2024   Controlled type 2 diabetes mellitus without complication, without long-term current use of insulin  (HCC) 02/18/2024   Hyperlipidemia 02/18/2024   Cellulitis of left lower extremity 02/14/2024   Aftercare following surgery of the circulatory system 08/28/2014   Varicose veins of leg with complications 08/07/2014   Varicose veins of bilateral lower extremities with other complications 05/01/2014   Past Medical History:  Diagnosis Date   Acid reflux    Diabetes mellitus without complication (HCC)    Seizures (HCC)    Varicose veins     Family History  Problem Relation Age of Onset   Hypertension Mother    COPD Mother    Cancer Mother 7       breast   Hypertension Father    Hypertension Sister     Past Surgical History:  Procedure Laterality Date   APPLICATION OF WOUND VAC Left 03/10/2024   Procedure: APPLICATION, WOUND VAC;  Surgeon: Harden Jerona GAILS, MD;  Location: [facility] OR;  Service: Orthopedics;  Laterality: Left;   HERNIA REPAIR     INCISION AND DRAINAGE OF DEEP ABSCESS, CALF Left 03/10/2024   Procedure: LEFT CALF DEBRIDEMENT;  Surgeon: Harden Jerona GAILS, MD;  Location: [facility] OR;  Service: Orthopedics;  Laterality: Left;  LEFT LEG DEBRIDEMENT   INCISION AND DRAINAGE OF DEEP ABSCESS, CALF Left 03/15/2024   Procedure: INCISION AND DRAINAGE OF DEEP ABSCESS, CALF;  Surgeon: Harden Jerona GAILS, MD;  Location: [facility] OR;  Service: Orthopedics;  Laterality: Left;  DEBRIDEMENT LEFT LEG   INGUINAL HERNIA REPAIR Left 02/04/2015   Procedure: LAPAROSCOPIC LEFT  INGUINAL HERNIA REPAIR WITH MESH;  Surgeon: Lynda Leos, MD;  Location: [facility]S;  Service: General;  Laterality: Left;   INSERTION OF MESH Left 02/04/2015   Procedure: INSERTION OF MESH;  Surgeon: Lynda Leos, MD;  Location: [facility];  Service: General;  Laterality: Left;   KNEE  SURGERY     VARICOSE VEIN SURGERY     Social History   Occupational History   Not on file  Tobacco Use   Smoking status: Never   Smokeless tobacco: Not on file  Substance and Sexual Activity   Alcohol use: Yes    Alcohol/week: 12.0 standard drinks of alcohol    Types: 12 Cans of beer per week    Comment: approx 6 beers a week   Drug use:  No   Sexual activity: Not on file

## 2024-05-01 ENCOUNTER — Ambulatory Visit: Admitting: Orthopedic Surgery

## 2024-05-01 ENCOUNTER — Telehealth: Payer: Self-pay

## 2024-05-01 NOTE — Telephone Encounter (Signed)
 Received faxed notification from [employer] stating that ciprofloxacin  is not recommended as patient has history of seizure disorder. Will route to provider.   Alert placed in provider's box for review.   Theophil Thivierge, BSN, RN

## 2024-05-04 ENCOUNTER — Ambulatory Visit (INDEPENDENT_AMBULATORY_CARE_PROVIDER_SITE_OTHER): Admitting: Orthopedic Surgery

## 2024-05-04 DIAGNOSIS — L97224 Non-pressure chronic ulcer of left calf with necrosis of bone: Secondary | ICD-10-CM | POA: Diagnosis not present

## 2024-05-04 DIAGNOSIS — L97924 Non-pressure chronic ulcer of unspecified part of left lower leg with necrosis of bone: Secondary | ICD-10-CM

## 2024-05-11 ENCOUNTER — Encounter: Payer: Self-pay | Admitting: Physician Assistant

## 2024-05-11 ENCOUNTER — Ambulatory Visit (INDEPENDENT_AMBULATORY_CARE_PROVIDER_SITE_OTHER): Admitting: Physician Assistant

## 2024-05-11 DIAGNOSIS — L97924 Non-pressure chronic ulcer of unspecified part of left lower leg with necrosis of bone: Secondary | ICD-10-CM | POA: Diagnosis not present

## 2024-05-11 NOTE — Progress Notes (Addendum)
 Office Visit Note   Patient: Joshua Conner           Date of Birth: [birth year]           MRN: [id number] Visit Date: 05/11/2024              Requested by: Joshua Carlin Redbird, MD [address] 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  He is starting to increse his daily activity  Assessment & Plan: Visit Diagnoses:  1. 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   Follow-Up Instructions: No follow-ups on file.   Ortho Exam  Patient is alert, oriented, no adenopathy, well-dressed, normal affect, normal respiratory effort. Wound bed with 95 % granulation tissue post 4 x 4 debridement. Top lateral corner with less yellow fibrinous tissue.  Wound measured 9 cm x 4 cm initially unchanged.  total surface area of 36 cm.     Imaging: No results found.     Labs: Lab Results  Component Value Date   HGBA1C 7.0 (H) 03/07/2024   HGBA1C 6.3 (H) 02/15/2024   ESRSEDRATE 9 03/28/2024   CRP 9.2 (H) 03/28/2024   CRP 8.5 (H) 02/16/2024   CRP 9.8 (H) 02/15/2024   REPTSTATUS 03/15/2024 FINAL 03/10/2024   GRAMSTAIN  03/10/2024    FEW WBC PRESENT, PREDOMINANTLY PMN RARE GRAM NEGATIVE RODS    CULT  03/10/2024    FEW ENTEROBACTER CLOACAE NO ANAEROBES ISOLATED Performed at [facility], [address] [place], [place] [address]    LABORGA ENTEROBACTER CLOACAE 03/10/2024     Lab Results  Component Value Date   ALBUMIN 3.3 (L) 03/08/2024   ALBUMIN 4.0 03/07/2024   ALBUMIN 3.9 02/14/2024    Lab Results  Component Value Date   MG 2.0 03/08/2024   MG 2.1 02/16/2024   MG 2.2 02/15/2024   No results found for: VD25OH  No results found for: PREALBUMIN    Latest Ref  Rng & Units 03/28/2024    3:05 AM 03/15/2024    3:32 AM 03/14/2024    1:11 AM  CBC EXTENDED  WBC 3.8 - 10.8 Thousand/uL 8.9  5.8  6.1   RBC 4.20 - 5.80 Million/uL 4.43  3.94  3.82   Hemoglobin 13.2 - 17.1 g/dL 86.8  88.2  88.8   HCT 38.5 - 50.0 % 39.5  35.6  34.7   Platelets 140 - 400 Thousand/uL 359  314  311   NEUT# 1,500 - 7,800 cells/uL 6,924        There is no height or weight on file to calculate BMI.  Orders:  No orders of the defined types were placed in this encounter.  No orders of the defined types were placed in this encounter.    Procedures: No procedures performed  Clinical Data: No additional findings.  ROS:  All other systems negative, except as noted in the HPI. Review of Systems  Objective: Vital Signs: There were no vitals taken for this visit.  Specialty Comments:  No specialty comments available.  PMFS History: Patient Active Problem List   Diagnosis Date Noted   Osteomyelitis (HCC) 03/14/2024   Skin ulcer  of left lower leg with necrosis of bone (HCC) 03/10/2024   History of seizure disorder 03/08/2024   CKD stage 3a, GFR 45-59 ml/min (HCC) 03/08/2024   Sepsis (HCC) 03/08/2024   Obesity, Class III, BMI 40-49.9 (morbid obesity) 03/08/2024   OSA (obstructive sleep apnea) 03/08/2024   Cellulitis 03/07/2024   Controlled type 2 diabetes mellitus without complication, without long-term current use of insulin  (HCC) 02/18/2024   Hyperlipidemia 02/18/2024   Cellulitis of left lower extremity 02/14/2024   Aftercare following surgery of the circulatory system 08/28/2014   Varicose veins of leg with complications 08/07/2014   Varicose veins of bilateral lower extremities with other complications 05/01/2014   Past Medical History:  Diagnosis Date   Acid reflux    Diabetes mellitus without complication (HCC)    Seizures (HCC)    Varicose veins     Family History  Problem Relation Age of Onset   Hypertension Mother    COPD Mother    Cancer Mother  32       breast   Hypertension Father    Hypertension Sister     Past Surgical History:  Procedure Laterality Date   APPLICATION OF WOUND VAC Left 03/10/2024   Procedure: APPLICATION, WOUND VAC;  Surgeon: Harden Jerona GAILS, MD;  Location: [facility] OR;  Service: Orthopedics;  Laterality: Left;   HERNIA REPAIR     INCISION AND DRAINAGE OF DEEP ABSCESS, CALF Left 03/10/2024   Procedure: LEFT CALF DEBRIDEMENT;  Surgeon: Harden Jerona GAILS, MD;  Location: [facility] OR;  Service: Orthopedics;  Laterality: Left;  LEFT LEG DEBRIDEMENT   INCISION AND DRAINAGE OF DEEP ABSCESS, CALF Left 03/15/2024   Procedure: INCISION AND DRAINAGE OF DEEP ABSCESS, CALF;  Surgeon: Harden Jerona GAILS, MD;  Location: [facility] OR;  Service: Orthopedics;  Laterality: Left;  DEBRIDEMENT LEFT LEG   INGUINAL HERNIA REPAIR Left 02/04/2015   Procedure: LAPAROSCOPIC LEFT  INGUINAL HERNIA REPAIR WITH MESH;  Surgeon: Lynda Leos, MD;  Location: [facility];  Service: General;  Laterality: Left;   INSERTION OF MESH Left 02/04/2015   Procedure: INSERTION OF MESH;  Surgeon: Lynda Leos, MD;  Location: [facility];  Service: General;  Laterality: Left;   KNEE SURGERY     VARICOSE VEIN SURGERY     Social History   Occupational History   Not on file  Tobacco Use   Smoking status: Never   Smokeless tobacco: Not on file  Substance and Sexual Activity   Alcohol use: Yes    Alcohol/week: 12.0 standard drinks of alcohol    Types: 12 Cans of beer per week    Comment: approx 6 beers a week   Drug use: No   Sexual activity: Not on file

## 2024-05-14 ENCOUNTER — Encounter: Payer: Self-pay | Admitting: Orthopedic Surgery

## 2024-05-14 NOTE — Progress Notes (Signed)
 Office Visit Note   Patient: Joshua Conner           Date of Birth: 02-May-1971           MRN: [id number] Visit Date: 05/04/2024              Requested by: Okey Carlin Redbird, MD [address] [place],  [place] [address] PCP: Okey Carlin Redbird, MD  Chief Complaint  Patient presents with   Left Leg - Routine Post Op    03/10/2024 and 03/15/2024 I&D of left leg x 2      HPI: Patient is a 53 year old gentleman who presents 2 months status post surgical debridement for abscess left lower extremity.  Patient had Kerecis applied in the office last week.  Assessment & Plan: Visit Diagnoses:  1. 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  Patient is okay to resume activities such as golf.  Follow-Up Instructions: Return in about 1 week (around 05/11/2024).   Ortho Exam  Patient is alert, oriented, no adenopathy, well-dressed, normal affect, normal respiratory effort. Examination the wound continues to show improvement with flat healthy granulation tissue.  The wound healing has stalled, the wound bed has healthy granulation tissue, and patient presents for evaluation and application of Kerecis MariGen Micro graft. After informed consent a 10 blade knife was used to debride the skin and soft tissue to healthy viable bleeding granulation tissue.  Silver nitrate was used for hemostasis. The wound measures: 9 cm in length, 4.5cm  in width, 1 mm in depth, wound location anterior lateral left calf. Kerecis MariGen micro tissue graft 38 cm2 was applied, and there was no wastage.  Please see the photo below of the Lot number and expiration date. The micro tissue graft was covered with a nonadherent Adaptic dressing, bolstered with 4 x 4 gauze and secured with a compression wrap.       Imaging: No results found.     Labs: Lab Results  Component Value Date   HGBA1C 7.0 (H) 03/07/2024    HGBA1C 6.3 (H) 02/15/2024   ESRSEDRATE 9 03/28/2024   CRP 9.2 (H) 03/28/2024   CRP 8.5 (H) 02/16/2024   CRP 9.8 (H) 02/15/2024   REPTSTATUS 03/15/2024 FINAL 03/10/2024   GRAMSTAIN  03/10/2024    FEW WBC PRESENT, PREDOMINANTLY PMN RARE GRAM NEGATIVE RODS    CULT  03/10/2024    FEW ENTEROBACTER CLOACAE NO ANAEROBES ISOLATED Performed at [facility], [address] [place], [place] [address]    LABORGA ENTEROBACTER CLOACAE 03/10/2024     Lab Results  Component Value Date   ALBUMIN 3.3 (L) 03/08/2024   ALBUMIN 4.0 03/07/2024   ALBUMIN 3.9 02/14/2024    Lab Results  Component Value Date   MG 2.0 03/08/2024   MG 2.1 02/16/2024   MG 2.2 02/15/2024   No results found for: VD25OH  No results found for: PREALBUMIN    Latest Ref Rng & Units 03/28/2024    3:05 AM 03/15/2024    3:32 AM 03/14/2024    1:11 AM  CBC EXTENDED  WBC 3.8 - 10.8 Thousand/uL 8.9  5.8  6.1   RBC 4.20 - 5.80 Million/uL 4.43  3.94  3.82   Hemoglobin 13.2 - 17.1 g/dL 86.8  88.2  88.8   HCT 38.5 - 50.0 % 39.5  35.6  34.7   Platelets 140 - 400 Thousand/uL  359  314  311   NEUT# 1,500 - 7,800 cells/uL 6,924        There is no height or weight on file to calculate BMI.  Orders:  No orders of the defined types were placed in this encounter.  No orders of the defined types were placed in this encounter.    Procedures: No procedures performed  Clinical Data: No additional findings.  ROS:  All other systems negative, except as noted in the HPI. Review of Systems  Objective: Vital Signs: There were no vitals taken for this visit.  Specialty Comments:  No specialty comments available.  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) 03/10/2024   History of seizure disorder 03/08/2024   CKD stage 3a, GFR 45-59 ml/min (HCC) 03/08/2024   Sepsis (HCC) 03/08/2024   Obesity,  Class III, BMI 40-49.9 (morbid obesity) 03/08/2024   OSA (obstructive sleep apnea) 03/08/2024   Cellulitis 03/07/2024   Controlled type 2 diabetes mellitus without complication, without long-term current use of insulin  (HCC) 02/18/2024   Hyperlipidemia 02/18/2024   Cellulitis of left lower extremity 02/14/2024   Aftercare following surgery of the circulatory system 08/28/2014   Varicose veins of leg with complications 08/07/2014   Varicose veins of bilateral lower extremities with other complications 05/01/2014   Past Medical History:  Diagnosis Date   Acid reflux    Diabetes mellitus without complication (HCC)    Seizures (HCC)    Varicose veins     Family History  Problem Relation Age of Onset   Hypertension Mother    COPD Mother    Cancer Mother 95       breast   Hypertension Father    Hypertension Sister     Past Surgical History:  Procedure Laterality Date   APPLICATION OF WOUND VAC Left 03/10/2024   Procedure: APPLICATION, WOUND VAC;  Surgeon: Harden Jerona GAILS, MD;  Location: [facility] OR;  Service: Orthopedics;  Laterality: Left;   HERNIA REPAIR     INCISION AND DRAINAGE OF DEEP ABSCESS, CALF Left 03/10/2024   Procedure: LEFT CALF DEBRIDEMENT;  Surgeon: Harden Jerona GAILS, MD;  Location: [facility] OR;  Service: Orthopedics;  Laterality: Left;  LEFT LEG DEBRIDEMENT   INCISION AND DRAINAGE OF DEEP ABSCESS, CALF Left 03/15/2024   Procedure: INCISION AND DRAINAGE OF DEEP ABSCESS, CALF;  Surgeon: Harden Jerona GAILS, MD;  Location: [facility] OR;  Service: Orthopedics;  Laterality: Left;  DEBRIDEMENT LEFT LEG   INGUINAL HERNIA REPAIR Left 02/04/2015   Procedure: LAPAROSCOPIC LEFT  INGUINAL HERNIA REPAIR WITH MESH;  Surgeon: Lynda Leos, MD;  Location: [facility];  Service: General;  Laterality: Left;   INSERTION OF MESH Left 02/04/2015   Procedure: INSERTION OF MESH;  Surgeon: Lynda Leos, MD;  Location: [facility];  Service: General;  Laterality: Left;   KNEE SURGERY     VARICOSE VEIN SURGERY     Social  History   Occupational History   Not on file  Tobacco Use   Smoking status: Never   Smokeless tobacco: Not on file  Substance and Sexual Activity   Alcohol use: Yes    Alcohol/week: 12.0 standard drinks of alcohol    Types: 12 Cans of beer per week    Comment: approx 6 beers a week   Drug use: No   Sexual activity: Not on file

## 2024-05-16 ENCOUNTER — Encounter: Payer: Self-pay | Admitting: Orthopedic Surgery

## 2024-05-17 ENCOUNTER — Ambulatory Visit (INDEPENDENT_AMBULATORY_CARE_PROVIDER_SITE_OTHER): Admitting: Physician Assistant

## 2024-05-17 ENCOUNTER — Encounter: Payer: Self-pay | Admitting: Physician Assistant

## 2024-05-17 DIAGNOSIS — L97924 Non-pressure chronic ulcer of unspecified part of left lower leg with necrosis of bone: Secondary | ICD-10-CM | POA: Diagnosis not present

## 2024-05-17 NOTE — Progress Notes (Signed)
 Office Visit Note   Patient: Joshua Conner           Date of Birth: 1970/11/11           MRN: [id number] Visit Date: 05/17/2024              Requested by: Okey Carlin Redbird, MD [address] [place],  [place] [address] PCP: Okey Carlin Redbird, MD  Chief Complaint  Patient presents with   Left Leg - Routine Post Op      HPatient is a 53 year old gentleman who presents 2 months status post surgical debridement for abscess left lower extremity. Patient had Kerecis applied in the office.   He states he had increased SS drainage and was worried about infection.  He also thought he had malodor.  He had Doxycycline  that he kept on hand from ID doctors.  He started taking this 100 mg BID yesterday.  He continues to work and walks a lot on his job.  He denies fever and chills.     Assessment & Plan: Visit Diagnoses: No diagnosis found.  Plan: After debridement Kerecis applied with ace wrap for compression.    Follow-Up Instructions: Return in about 8 days (around 05/25/2024).   Ortho Exam  Patient is alert, oriented, no adenopathy, well-dressed, normal affect, normal respiratory effort. Minimal hypergranulation tissue in the center of the wound bed.  Applied silver nitrate.  10 sharp debridement of the yellow fibrinous tissue.  the wound bed has healthy granulation tissue.  Wound measures 9 x 4.5 cm.  38 cm square.  No active drainage, malodor or cellulitis.      Imaging: No results found.    Labs: Lab Results  Component Value Date   HGBA1C 7.0 (H) 03/07/2024   HGBA1C 6.3 (H) 02/15/2024   ESRSEDRATE 9 03/28/2024   CRP 9.2 (H) 03/28/2024   CRP 8.5 (H) 02/16/2024   CRP 9.8 (H) 02/15/2024   REPTSTATUS 03/15/2024 FINAL 03/10/2024   GRAMSTAIN  03/10/2024    FEW WBC PRESENT, PREDOMINANTLY PMN RARE GRAM NEGATIVE RODS    CULT  03/10/2024    FEW ENTEROBACTER CLOACAE NO ANAEROBES ISOLATED Performed at [facility], [address] [place], [place] [address]     LABORGA ENTEROBACTER CLOACAE 03/10/2024     Lab Results  Component Value Date   ALBUMIN 3.3 (L) 03/08/2024   ALBUMIN 4.0 03/07/2024   ALBUMIN 3.9 02/14/2024    Lab Results  Component Value Date   MG 2.0 03/08/2024   MG 2.1 02/16/2024   MG 2.2 02/15/2024   No results found for: VD25OH  No results found for: PREALBUMIN    Latest Ref Rng & Units 03/28/2024    3:05 AM 03/15/2024    3:32 AM 03/14/2024    1:11 AM  CBC EXTENDED  WBC 3.8 - 10.8 Thousand/uL 8.9  5.8  6.1   RBC 4.20 - 5.80 Million/uL 4.43  3.94  3.82   Hemoglobin 13.2 - 17.1 g/dL 86.8  88.2  88.8   HCT 38.5 - 50.0 % 39.5  35.6  34.7   Platelets 140 - 400 Thousand/uL 359  314  311   NEUT# 1,500 - 7,800 cells/uL 6,924        There is no height or weight on file to calculate BMI.  Orders:  No orders of the defined types were placed in this encounter.  No orders of the defined types were placed in this encounter.    Procedures: No procedures performed  Clinical Data: No additional findings.  ROS:  All other systems negative, except as noted in the HPI. Review of Systems  Objective: Vital Signs: There were no vitals taken for this visit.  Specialty Comments:  No specialty comments available.  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) 03/10/2024   History of seizure disorder 03/08/2024   CKD stage 3a, GFR 45-59 ml/min (HCC) 03/08/2024   Sepsis (HCC) 03/08/2024   Obesity, Class III, BMI 40-49.9 (morbid obesity) 03/08/2024   OSA (obstructive sleep apnea) 03/08/2024   Cellulitis 03/07/2024   Controlled type 2 diabetes mellitus without complication, without long-term current use of insulin  (HCC) 02/18/2024   Hyperlipidemia 02/18/2024   Cellulitis of left lower extremity 02/14/2024   Aftercare following surgery of the circulatory system 08/28/2014   Varicose veins of leg with  complications 08/07/2014   Varicose veins of bilateral lower extremities with other complications 05/01/2014   Past Medical History:  Diagnosis Date   Acid reflux    Diabetes mellitus without complication (HCC)    Seizures (HCC)    Varicose veins     Family History  Problem Relation Age of Onset   Hypertension Mother    COPD Mother    Cancer Mother 88       breast   Hypertension Father    Hypertension Sister     Past Surgical History:  Procedure Laterality Date   APPLICATION OF WOUND VAC Left 03/10/2024   Procedure: APPLICATION, WOUND VAC;  Surgeon: Harden Jerona GAILS, MD;  Location: [facility] OR;  Service: Orthopedics;  Laterality: Left;   HERNIA REPAIR     INCISION AND DRAINAGE OF DEEP ABSCESS, CALF Left 03/10/2024   Procedure: LEFT CALF DEBRIDEMENT;  Surgeon: Harden Jerona GAILS, MD;  Location: [facility] OR;  Service: Orthopedics;  Laterality: Left;  LEFT LEG DEBRIDEMENT   INCISION AND DRAINAGE OF DEEP ABSCESS, CALF Left 03/15/2024   Procedure: INCISION AND DRAINAGE OF DEEP ABSCESS, CALF;  Surgeon: Harden Jerona GAILS, MD;  Location: [facility] OR;  Service: Orthopedics;  Laterality: Left;  DEBRIDEMENT LEFT LEG   INGUINAL HERNIA REPAIR Left 02/04/2015   Procedure: LAPAROSCOPIC LEFT  INGUINAL HERNIA REPAIR WITH MESH;  Surgeon: Lynda Leos, MD;  Location: [facility];  Service: General;  Laterality: Left;   INSERTION OF MESH Left 02/04/2015   Procedure: INSERTION OF MESH;  Surgeon: Lynda Leos, MD;  Location: [facility];  Service: General;  Laterality: Left;   KNEE SURGERY     VARICOSE VEIN SURGERY     Social History   Occupational History   Not on file  Tobacco Use   Smoking status: Never   Smokeless tobacco: Not on file  Substance and Sexual Activity   Alcohol use: Yes    Alcohol/week: 12.0 standard drinks of alcohol    Types: 12 Cans of beer per week    Comment: approx 6 beers a week   Drug use: No   Sexual activity: Not on file

## 2024-05-18 ENCOUNTER — Encounter: Admitting: Orthopedic Surgery

## 2024-05-24 ENCOUNTER — Ambulatory Visit: Admitting: Internal Medicine

## 2024-05-24 ENCOUNTER — Encounter: Payer: Self-pay | Admitting: Internal Medicine

## 2024-05-24 ENCOUNTER — Other Ambulatory Visit: Payer: Self-pay

## 2024-05-24 VITALS — BP 143/86 | HR 73 | Temp 97.6°F | Wt 304.0 lb

## 2024-05-24 DIAGNOSIS — L97924 Non-pressure chronic ulcer of unspecified part of left lower leg with necrosis of bone: Secondary | ICD-10-CM | POA: Diagnosis not present

## 2024-05-24 NOTE — Progress Notes (Signed)
 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) 03/10/2024   History of seizure disorder 03/08/2024   CKD stage 3a, GFR 45-59 ml/min (HCC) 03/08/2024   Sepsis (HCC) 03/08/2024   Obesity, Class III, BMI 40-49.9 (morbid obesity) 03/08/2024   OSA (obstructive sleep apnea) 03/08/2024   Cellulitis 03/07/2024   Controlled type 2 diabetes mellitus without complication, without long-term current use of insulin  (HCC) 02/18/2024   Hyperlipidemia 02/18/2024   Cellulitis of left lower extremity 02/14/2024   Aftercare following surgery of the circulatory system 08/28/2014   Varicose veins of leg with complications 08/07/2014   Varicose veins of bilateral lower extremities with other complications 05/01/2014     Subjective:  [name] is a 53 year old male with past medical history as below includes diabetes mellitus, varicose veins presents for hospital follow-up ofLeft calf ulcer with cellulitis and abscess and subcutaneous tissue per MRI status post I&D with wound VAC placement on 5/15 debridement to bone with concern for osteomyelitis cultures growing Enterobacter cloacae S sensitive to ciprofloxacin  and Bactrim.  Discharged on cefepime  x 6 weeks EOT 04/26/2024 6/3: Doing well no new complaints denies fever and chills 630: Tolerating abx. No new complaints.  Today 05/24/24: doing well no new complaints. Has not needed abx.  Review of Systems  All other systems reviewed and are negative.   Past Medical History:  Diagnosis Date   Acid reflux    Diabetes mellitus without complication (HCC)    Seizures (HCC)    Varicose veins     Outpatient Medications Prior to Visit  Medication Sig Dispense Refill   gabapentin  (NEURONTIN ) 100 MG capsule TAKE 1 CAPSULE BY MOUTH THREE TIMES A DAY 90 capsule 0    HYDROcodone -acetaminophen  (NORCO/VICODIN) 5-325 MG tablet Take 1 tablet by mouth every 6 (six) hours as needed for moderate pain (pain score 4-6). 20 tablet 0   JARDIANCE  25 MG TABS tablet Take 25 mg by mouth in the morning.     LamoTRIgine  250 MG TB24 24 hour tablet Take 250 mg by mouth in the morning.     methocarbamol  (ROBAXIN ) 500 MG tablet Take 1 tablet (500 mg total) by mouth every 6 (six) hours as needed for muscle spasms. 30 tablet 0   oxyCODONE  (OXY IR/ROXICODONE ) 5 MG immediate release tablet Take 1 tablet (5 mg total) by mouth every 4 (four) hours as needed for severe pain (pain score 7-10). 30 tablet 0   pantoprazole  (PROTONIX ) 40 MG tablet Take 40 mg by mouth daily before breakfast.     polyethylene glycol powder (GLYCOLAX /MIRALAX ) 17 GM/SCOOP powder Take 17 g by mouth daily as needed. (Patient not taking: Reported on 04/24/2024) 476 g 0   rosuvastatin  (CRESTOR ) 10 MG tablet Take 10 mg by mouth in the morning.     senna-docusate (SENOKOT-S) 8.6-50 MG tablet Take 2 tablets by mouth at bedtime. (Patient not taking: Reported on 04/24/2024) 60 tablet 0   Soft Lens Products (CLEAR EYES CONTACT LENS RELIEF) SOLN Place 1 drop into both eyes as needed (for dry eyes). For rewetting contact lenses.     TYLENOL  500 MG tablet Take 1,000 mg by mouth every 6 (six) hours as needed (for pain).     No facility-administered medications prior to visit.     No  Known Allergies  Social History   Tobacco Use   Smoking status: Never  Substance Use Topics   Alcohol use: Yes    Alcohol/week: 12.0 standard drinks of alcohol    Types: 12 Cans of beer per week    Comment: approx 6 beers a week   Drug use: No    Family History  Problem Relation Age of Onset   Hypertension Mother    COPD Mother    Cancer Mother 67       breast   Hypertension Father    Hypertension Sister     Objective:  There were no vitals filed for this visit. There is no height or weight on file to calculate  BMI.  Physical Exam Constitutional:      General: He is not in acute distress.    Appearance: He is normal weight. He is not toxic-appearing.  HENT:     Head: Normocephalic and atraumatic.     Right Ear: External ear normal.     Left Ear: External ear normal.     Nose: No congestion or rhinorrhea.     Mouth/Throat:     Mouth: Mucous membranes are moist.     Pharynx: Oropharynx is clear.  Eyes:     Extraocular Movements: Extraocular movements intact.     Conjunctiva/sclera: Conjunctivae normal.     Pupils: Pupils are equal, round, and reactive to light.  Cardiovascular:     Rate and Rhythm: Normal rate and regular rhythm.     Heart sounds: No murmur heard.    No friction rub. No gallop.  Pulmonary:     Effort: Pulmonary effort is normal.     Breath sounds: Normal breath sounds.  Abdominal:     General: Abdomen is flat. Bowel sounds are normal.     Palpations: Abdomen is soft.  Musculoskeletal:        General: No swelling. Normal range of motion.     Cervical back: Normal range of motion and neck supple.     Comments: RLE bandage. Reviewed images in chart.   Skin:    General: Skin is warm and dry.  Neurological:     General: No focal deficit present.     Mental Status: He is oriented to person, place, and time.  Psychiatric:        Mood and Affect: Mood normal.     Lab Results: Lab Results  Component Value Date   WBC 8.9 03/28/2024   HGB 13.1 (L) 03/28/2024   HCT 39.5 03/28/2024   MCV 89.2 03/28/2024   PLT 359 03/28/2024    Lab Results  Component Value Date   CREATININE 1.37 (H) 03/28/2024   BUN 19 03/28/2024   NA 137 03/28/2024   K 4.4 03/28/2024   CL 104 03/28/2024   CO2 25 03/28/2024    Lab Results  Component Value Date   ALT 23 03/28/2024   AST 13 03/28/2024   ALKPHOS 50 03/08/2024   BILITOT 0.6 03/28/2024     Assessment & Plan:   53YM with large left calf ulcer with cellulitis and abscess in subcutaneous tissue per mri s/p I x D and wound vac  placement on 5/15 - Debridement to bone concern for osteomyelitis, cultures growing Enterobacter cloacae sensitive to Bactrim and ciprofloxacin .  Patient discharged on cefepime  x 6 weeks EOT 04/26/2024 - Seen by orthopedics per patient on 6/27 noted healthy granulation itssue , although wound healing stalled - Competed cefepime  till 7/2 Seen by orthopedics, on  7/17 noted to have granulation tissue with follow-up given.  PT states he is not on  on any abx since last visit(sen by ortho on 7/23). He has cipro  on hand if concern for infection returns but he has had no issues.  Plan: Labs today off of abx Pt states wound is healing well. He has graft with topical solution on top, plans to get it replaced at next ortho visit as such did not remove bandage. Reviewed chart images.  Counseled pt if he does start the cipro  to let ID know as we will followup at that point. He has not had to use it.  F/U with ID PRN     #Medication management #PICC line-pulled #DM A1c 7 on 03/07/2024  Loney Stank, MD [facility]   05/24/24  5:57 AM I have personally spent 35 minutes involved in face-to-face and non-face-to-face activities for this patient on the day of the visit. Professional time spent includes the following activities: Preparing to see the patient (review of tests), Obtaining and/or reviewing separately obtained history (admission/discharge record), Performing a medically appropriate examination and/or evaluation , Ordering medications/tests/procedures, referring and communicating with other health care professionals, Documenting clinical information in the EMR, Independently interpreting results (not separately reported), Communicating results to the patient/family/caregiver, Counseling and educating the patient/family/caregiver and Care coordination (not separately reported).

## 2024-05-25 ENCOUNTER — Ambulatory Visit: Admitting: Orthopedic Surgery

## 2024-05-25 ENCOUNTER — Encounter: Payer: Self-pay | Admitting: Orthopedic Surgery

## 2024-05-25 DIAGNOSIS — L97924 Non-pressure chronic ulcer of unspecified part of left lower leg with necrosis of bone: Secondary | ICD-10-CM | POA: Diagnosis not present

## 2024-05-25 LAB — CBC WITH DIFFERENTIAL/PLATELET
Absolute Lymphocytes: 1360 {cells}/uL (ref 850–3900)
Absolute Monocytes: 372 {cells}/uL (ref 200–950)
Basophils Absolute: 92 {cells}/uL (ref 0–200)
Basophils Relative: 1.5 %
Eosinophils Absolute: 104 {cells}/uL (ref 15–500)
Eosinophils Relative: 1.7 %
HCT: 40.4 % (ref 38.5–50.0)
Hemoglobin: 13 g/dL — ABNORMAL LOW (ref 13.2–17.1)
MCH: 28.3 pg (ref 27.0–33.0)
MCHC: 32.2 g/dL (ref 32.0–36.0)
MCV: 87.8 fL (ref 80.0–100.0)
MPV: 8.7 fL (ref 7.5–12.5)
Monocytes Relative: 6.1 %
Neutro Abs: 4172 {cells}/uL (ref 1500–7800)
Neutrophils Relative %: 68.4 %
Platelets: 355 Thousand/uL (ref 140–400)
RBC: 4.6 Million/uL (ref 4.20–5.80)
RDW: 13 % (ref 11.0–15.0)
Total Lymphocyte: 22.3 %
WBC: 6.1 Thousand/uL (ref 3.8–10.8)

## 2024-05-25 LAB — C-REACTIVE PROTEIN: CRP: 8.9 mg/L — ABNORMAL HIGH (ref <8.0)

## 2024-05-25 LAB — COMPLETE METABOLIC PANEL WITHOUT GFR
AG Ratio: 1.9 (calc) (ref 1.0–2.5)
ALT: 20 U/L (ref 9–46)
AST: 17 U/L (ref 10–35)
Albumin: 4.4 g/dL (ref 3.6–5.1)
Alkaline phosphatase (APISO): 72 U/L (ref 35–144)
BUN: 16 mg/dL (ref 7–25)
CO2: 27 mmol/L (ref 20–32)
Calcium: 9.3 mg/dL (ref 8.6–10.3)
Chloride: 103 mmol/L (ref 98–110)
Creat: 1.28 mg/dL (ref 0.70–1.30)
Globulin: 2.3 g/dL (ref 1.9–3.7)
Glucose, Bld: 222 mg/dL — ABNORMAL HIGH (ref 65–99)
Potassium: 4.1 mmol/L (ref 3.5–5.3)
Sodium: 138 mmol/L (ref 135–146)
Total Bilirubin: 0.5 mg/dL (ref 0.2–1.2)
Total Protein: 6.7 g/dL (ref 6.1–8.1)

## 2024-05-25 LAB — SEDIMENTATION RATE: Sed Rate: 6 mm/h (ref 0–20)

## 2024-05-25 NOTE — Progress Notes (Signed)
 Office Visit Note   Patient: Joshua Conner           Date of Birth: 21-Nov-1970           MRN: [id number] Visit Date: 05/25/2024              Requested by: Okey Carlin Redbird, MD [address] [place],  [place] [address] PCP: Okey Carlin Redbird, MD  Chief Complaint  Patient presents with   Left Leg - Routine Post Op    03/10/2024 and 03/15/2024 I&D of left leg x 2      HPI: Patient presents in follow-up for chronic ulceration of the left leg.  Patient states she was playing golf about 4 times a week.  Patient states the swelling does go down when he elevates his leg at home.  Assessment & Plan: Visit Diagnoses:  1. 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  Will have him change his large dressing on top of the Adaptic daily continue with compression.  Anticipate placing him in a Vive wear compression sock at follow-up.  Follow-Up Instructions: Return in about 1 week (around 06/01/2024).   Ortho Exam  Patient is alert, oriented, no adenopathy, well-dressed, normal affect, normal respiratory effort. Examination of the incision does have some fibrinous exudative tissue.  After informed consent this was debrided back to healthy bleeding viable granulation tissue.  The wound bed continues to improve.  The wound healing has stalled, the wound bed has healthy granulation tissue, and patient presents for evaluation and application of Kerecis MariGen Micro graft. After informed consent a 10 blade knife was used to debride the skin and soft tissue to healthy viable bleeding granulation tissue.  Silver nitrate was used for hemostasis. The wound measures: 7.5 cm in length, 4cm  in width, 1 mm in depth, wound location left calf Kerecis MariGen micro tissue graft 19 cm2 was applied, and there was no wastage.  Please see the photo below of the Lot number and expiration date. The micro tissue graft was covered with a nonadherent  Adaptic dressing, bolstered with 4 x 4 gauze and secured with a compression wrap.       Imaging: No results found.   Labs: Lab Results  Component Value Date   HGBA1C 7.0 (H) 03/07/2024   HGBA1C 6.3 (H) 02/15/2024   ESRSEDRATE 6 05/24/2024   ESRSEDRATE 9 03/28/2024   CRP 8.9 (H) 05/24/2024   CRP 9.2 (H) 03/28/2024   CRP 8.5 (H) 02/16/2024   REPTSTATUS 03/15/2024 FINAL 03/10/2024   GRAMSTAIN  03/10/2024    FEW WBC PRESENT, PREDOMINANTLY PMN RARE GRAM NEGATIVE RODS    CULT  03/10/2024    FEW ENTEROBACTER CLOACAE NO ANAEROBES ISOLATED Performed at [facility], [address] [place], [place] [address]    LABORGA ENTEROBACTER CLOACAE 03/10/2024     Lab Results  Component Value Date   ALBUMIN 3.3 (L) 03/08/2024   ALBUMIN 4.0 03/07/2024   ALBUMIN 3.9 02/14/2024    Lab Results  Component Value Date   MG 2.0 03/08/2024   MG 2.1 02/16/2024   MG 2.2 02/15/2024   No results found for: VD25OH  No results found for: PREALBUMIN    Latest Ref Rng & Units 05/24/2024   11:17 AM 03/28/2024    3:05 AM 03/15/2024    3:32 AM  CBC EXTENDED  WBC 3.8 - 10.8 Thousand/uL 6.1  8.9  5.8  RBC 4.20 - 5.80 Million/uL 4.60  4.43  3.94   Hemoglobin 13.2 - 17.1 g/dL 86.9  86.8  88.2   HCT 38.5 - 50.0 % 40.4  39.5  35.6   Platelets 140 - 400 Thousand/uL 355  359  314   NEUT# 1,500 - 7,800 cells/uL 4,172  6,924       There is no height or weight on file to calculate BMI.  Orders:  No orders of the defined types were placed in this encounter.  No orders of the defined types were placed in this encounter.    Procedures: No procedures performed  Clinical Data: No additional findings.  ROS:  All other systems negative, except as noted in the HPI. Review of Systems  Objective: Vital Signs: There were no vitals taken for this visit.  Specialty Comments:  No specialty comments available.  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) 03/10/2024   History of seizure disorder 03/08/2024   CKD stage 3a, GFR 45-59 ml/min (HCC) 03/08/2024   Sepsis (HCC) 03/08/2024   Obesity, Class III, BMI 40-49.9 (morbid obesity) 03/08/2024   OSA (obstructive sleep apnea) 03/08/2024   Cellulitis 03/07/2024   Controlled type 2 diabetes mellitus without complication, without long-term current use of insulin  (HCC) 02/18/2024   Hyperlipidemia 02/18/2024   Cellulitis of left lower extremity 02/14/2024   Aftercare following surgery of the circulatory system 08/28/2014   Varicose veins of leg with complications 08/07/2014   Varicose veins of bilateral lower extremities with other complications 05/01/2014   Past Medical History:  Diagnosis Date   Acid reflux    Diabetes mellitus without complication (HCC)    Seizures (HCC)    Varicose veins     Family History  Problem Relation Age of Onset   Hypertension Mother    COPD Mother    Cancer Mother 67       breast   Hypertension Father    Hypertension Sister     Past Surgical History:  Procedure Laterality Date   APPLICATION OF WOUND VAC Left 03/10/2024   Procedure: APPLICATION, WOUND VAC;  Surgeon: Harden Jerona GAILS, MD;  Location: [facility] OR;  Service: Orthopedics;  Laterality: Left;   HERNIA REPAIR     INCISION AND DRAINAGE OF DEEP ABSCESS, CALF Left 03/10/2024   Procedure: LEFT CALF DEBRIDEMENT;  Surgeon: Harden Jerona GAILS, MD;  Location: [facility] OR;  Service: Orthopedics;  Laterality: Left;  LEFT LEG DEBRIDEMENT   INCISION AND DRAINAGE OF DEEP ABSCESS, CALF Left 03/15/2024   Procedure: INCISION AND DRAINAGE OF DEEP ABSCESS, CALF;  Surgeon: Harden Jerona GAILS, MD;  Location: [facility] OR;  Service: Orthopedics;  Laterality: Left;  DEBRIDEMENT LEFT LEG   INGUINAL HERNIA REPAIR Left 02/04/2015   Procedure: LAPAROSCOPIC LEFT  INGUINAL HERNIA REPAIR WITH MESH;  Surgeon: Lynda Leos, MD;  Location: [facility] ORS;  Service: General;   Laterality: Left;   INSERTION OF MESH Left 02/04/2015   Procedure: INSERTION OF MESH;  Surgeon: Lynda Leos, MD;  Location: [facility];  Service: General;  Laterality: Left;   KNEE SURGERY     VARICOSE VEIN SURGERY     Social History   Occupational History   Not on file  Tobacco Use   Smoking status: Never   Smokeless tobacco: Not on file  Substance and Sexual Activity   Alcohol use: Yes    Alcohol/week: 12.0 standard  drinks of alcohol    Types: 12 Cans of beer per week    Comment: approx 6 beers a week   Drug use: No   Sexual activity: Not on file

## 2024-06-01 ENCOUNTER — Ambulatory Visit (INDEPENDENT_AMBULATORY_CARE_PROVIDER_SITE_OTHER): Admitting: Orthopedic Surgery

## 2024-06-01 DIAGNOSIS — L97924 Non-pressure chronic ulcer of unspecified part of left lower leg with necrosis of bone: Secondary | ICD-10-CM

## 2024-06-07 ENCOUNTER — Encounter: Payer: Self-pay | Admitting: Orthopedic Surgery

## 2024-06-07 NOTE — Progress Notes (Signed)
 Office Visit Note   Patient: Joshua Conner           Date of Birth: [birth year]           MRN: [id number] Visit Date: 06/01/2024              Requested by: Okey Carlin Redbird, MD [address] [place],  [place] [address] PCP: Okey Carlin Redbird, MD  Chief Complaint  Patient presents with   Left Leg - Follow-up    03/10/2024 and 03/15/2024 I&D of left leg x 2      HPI: Patient is a 53 year old gentleman status post serial debridement for left lower extremity wound.  Assessment & Plan: Visit Diagnoses:  1. 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  Follow-Up Instructions: Return in about 1 week (around 06/08/2024).   Ortho Exam  Patient is alert, oriented, no adenopathy, well-dressed, normal affect, normal respiratory effort. Examination the wound bed has flat healthy granulation tissue and measures 8.5 x 4.5 cm.  Donated Kerecis graft is applied.    Imaging: No results found.   Labs: Lab Results  Component Value Date   HGBA1C 7.0 (H) 03/07/2024   HGBA1C 6.3 (H) 02/15/2024   ESRSEDRATE 6 05/24/2024   ESRSEDRATE 9 03/28/2024   CRP 8.9 (H) 05/24/2024   CRP 9.2 (H) 03/28/2024   CRP 8.5 (H) 02/16/2024   REPTSTATUS 03/15/2024 FINAL 03/10/2024   GRAMSTAIN  03/10/2024    FEW WBC PRESENT, PREDOMINANTLY PMN RARE GRAM NEGATIVE RODS    CULT  03/10/2024    FEW ENTEROBACTER CLOACAE NO ANAEROBES ISOLATED Performed at [facility], [address] [place], [place] [address]    LABORGA ENTEROBACTER CLOACAE 03/10/2024     Lab Results  Component Value Date   ALBUMIN 3.3 (L) 03/08/2024   ALBUMIN 4.0 03/07/2024   ALBUMIN 3.9 02/14/2024    Lab Results  Component Value Date   MG 2.0 03/08/2024   MG 2.1 02/16/2024   MG 2.2 02/15/2024   No results found for: VD25OH  No results found for: PREALBUMIN    Latest Ref Rng & Units 05/24/2024   11:17  AM 03/28/2024    3:05 AM 03/15/2024    3:32 AM  CBC EXTENDED  WBC 3.8 - 10.8 Thousand/uL 6.1  8.9  5.8   RBC 4.20 - 5.80 Million/uL 4.60  4.43  3.94   Hemoglobin 13.2 - 17.1 g/dL 86.9  86.8  88.2   HCT 38.5 - 50.0 % 40.4  39.5  35.6   Platelets 140 - 400 Thousand/uL 355  359  314   NEUT# 1,500 - 7,800 cells/uL 4,172  6,924       There is no height or weight on file to calculate BMI.  Orders:  No orders of the defined types were placed in this encounter.  No orders of the defined types were placed in this encounter.    Procedures: No procedures performed  Clinical Data: No additional findings.  ROS:  All other systems negative, except as noted in the HPI. Review of Systems  Objective: Vital Signs: There were no vitals taken for this visit.  Specialty Comments:  No specialty comments available.  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) 03/10/2024  History of seizure disorder 03/08/2024   CKD stage 3a, GFR 45-59 ml/min (HCC) 03/08/2024   Sepsis (HCC) 03/08/2024   Obesity, Class III, BMI 40-49.9 (morbid obesity) 03/08/2024   OSA (obstructive sleep apnea) 03/08/2024   Cellulitis 03/07/2024   Controlled type 2 diabetes mellitus without complication, without long-term current use of insulin  (HCC) 02/18/2024   Hyperlipidemia 02/18/2024   Cellulitis of left lower extremity 02/14/2024   Aftercare following surgery of the circulatory system 08/28/2014   Varicose veins of leg with complications 08/07/2014   Varicose veins of bilateral lower extremities with other complications 05/01/2014   Past Medical History:  Diagnosis Date   Acid reflux    Diabetes mellitus without complication (HCC)    Seizures (HCC)    Varicose veins     Family History  Problem Relation Age of Onset   Hypertension Mother    COPD Mother    Cancer Mother 49       breast    Hypertension Father    Hypertension Sister     Past Surgical History:  Procedure Laterality Date   APPLICATION OF WOUND VAC Left 03/10/2024   Procedure: APPLICATION, WOUND VAC;  Surgeon: Harden Jerona GAILS, MD;  Location: [facility] OR;  Service: Orthopedics;  Laterality: Left;   HERNIA REPAIR     INCISION AND DRAINAGE OF DEEP ABSCESS, CALF Left 03/10/2024   Procedure: LEFT CALF DEBRIDEMENT;  Surgeon: Harden Jerona GAILS, MD;  Location: [facility] OR;  Service: Orthopedics;  Laterality: Left;  LEFT LEG DEBRIDEMENT   INCISION AND DRAINAGE OF DEEP ABSCESS, CALF Left 03/15/2024   Procedure: INCISION AND DRAINAGE OF DEEP ABSCESS, CALF;  Surgeon: Harden Jerona GAILS, MD;  Location: [facility] OR;  Service: Orthopedics;  Laterality: Left;  DEBRIDEMENT LEFT LEG   INGUINAL HERNIA REPAIR Left 02/04/2015   Procedure: LAPAROSCOPIC LEFT  INGUINAL HERNIA REPAIR WITH MESH;  Surgeon: Lynda Leos, MD;  Location: [facility];  Service: General;  Laterality: Left;   INSERTION OF MESH Left 02/04/2015   Procedure: INSERTION OF MESH;  Surgeon: Lynda Leos, MD;  Location: [facility];  Service: General;  Laterality: Left;   KNEE SURGERY     VARICOSE VEIN SURGERY     Social History   Occupational History   Not on file  Tobacco Use   Smoking status: Never   Smokeless tobacco: Not on file  Substance and Sexual Activity   Alcohol use: Yes    Alcohol/week: 12.0 standard drinks of alcohol    Types: 12 Cans of beer per week    Comment: approx 6 beers a week   Drug use: No   Sexual activity: Not on file

## 2024-06-08 ENCOUNTER — Ambulatory Visit (INDEPENDENT_AMBULATORY_CARE_PROVIDER_SITE_OTHER): Admitting: Orthopedic Surgery

## 2024-06-08 DIAGNOSIS — L97924 Non-pressure chronic ulcer of unspecified part of left lower leg with necrosis of bone: Secondary | ICD-10-CM

## 2024-06-08 NOTE — Progress Notes (Signed)
 Patient came in today to have his dynflex compression wrap removed. This was taken off and wound was cleansed with debridement pad and vashe. Left a soaked gauze on top of wound for several minutes. Patient did tolerate this well and had no complaints of pain. Went back into exam room cleaned up leg and applied Vive compression sock over top of skin, wrapped with gauze and ACE over the sock to catch any drainage. Patient is [place] at the beach all [place] and he will return into office on 06/22/24.

## 2024-06-09 ENCOUNTER — Encounter: Payer: Self-pay | Admitting: Orthopedic Surgery

## 2024-06-15 ENCOUNTER — Ambulatory Visit: Admitting: Orthopedic Surgery

## 2024-06-22 ENCOUNTER — Encounter: Payer: Self-pay | Admitting: Orthopedic Surgery

## 2024-06-22 ENCOUNTER — Ambulatory Visit (INDEPENDENT_AMBULATORY_CARE_PROVIDER_SITE_OTHER): Admitting: Orthopedic Surgery

## 2024-06-22 DIAGNOSIS — L03116 Cellulitis of left lower limb: Secondary | ICD-10-CM

## 2024-06-22 DIAGNOSIS — L97924 Non-pressure chronic ulcer of unspecified part of left lower leg with necrosis of bone: Secondary | ICD-10-CM

## 2024-06-22 NOTE — Progress Notes (Signed)
 Office Visit Note   Patient: Joshua Conner           Date of Birth: 07-11-1971           MRN: [id number] Visit Date: 06/22/2024              Requested by: Okey Carlin Redbird, MD [address] [place],  [place] [address] PCP: Okey Carlin Redbird, MD  Chief Complaint  Patient presents with   Left Leg - Routine Post Op    03/10/2024 and 03/15/2024 I&D of left leg x 2      HPI: Discussed the use of AI scribe software for clinical note transcription with the patient, who gave verbal consent to proceed.  History of Present Illness Joshua Conner is a 53 year old male who presents for follow-up of a wound with granulation tissue.  He used a submergible cast cover while at the beach last week to keep the wound covered, which allowed him to participate in water activities. The cover was effective and beneficial.  He has been using a compression sock, which has significantly reduced swelling and aided in wound healing. The sock is comfortable and helps with the swelling.     Assessment & Plan: Visit Diagnoses:  1. 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 Cellulitis of left lower extremity     Plan: Assessment and Plan Assessment & Plan Chronic lower extremity ulcer with localized edema Ulcer size reduced to 7.5 x 4 cm with healthy granulation tissue. No cellulitis. Persistent but mild edema. Fibrinous tissue removed. - Trim yellow tissue around ulcer edge. - Apply Bosch dressing until bleeding stops. - Resume compression sock post-bleeding.      Follow-Up Instructions: Return in about 4 weeks (around 07/20/2024).   Ortho Exam  Patient is alert, oriented, no adenopathy, well-dressed, normal affect, normal respiratory effort. Physical Exam SKIN: Flat, healthy granulation tissue with mild swelling. No cellulitis. Fibrinous exudative tissue removed.      Imaging: No results found.   Labs: Lab Results  Component Value Date   HGBA1C 7.0  (H) 03/07/2024   HGBA1C 6.3 (H) 02/15/2024   ESRSEDRATE 6 05/24/2024   ESRSEDRATE 9 03/28/2024   CRP 8.9 (H) 05/24/2024   CRP 9.2 (H) 03/28/2024   CRP 8.5 (H) 02/16/2024   REPTSTATUS 03/15/2024 FINAL 03/10/2024   GRAMSTAIN  03/10/2024    FEW WBC PRESENT, PREDOMINANTLY PMN RARE GRAM NEGATIVE RODS    CULT  03/10/2024    FEW ENTEROBACTER CLOACAE NO ANAEROBES ISOLATED Performed at [facility], [address] [place], [place] [address]    LABORGA ENTEROBACTER CLOACAE 03/10/2024     Lab Results  Component Value Date   ALBUMIN 3.3 (L) 03/08/2024   ALBUMIN 4.0 03/07/2024   ALBUMIN 3.9 02/14/2024    Lab Results  Component Value Date   MG 2.0 03/08/2024   MG 2.1 02/16/2024   MG 2.2 02/15/2024   No results found for: VD25OH  No results found for: PREALBUMIN    Latest Ref Rng & Units 05/24/2024   11:17 AM 03/28/2024    3:05 AM 03/15/2024    3:32 AM  CBC EXTENDED  WBC 3.8 - 10.8 Thousand/uL 6.1  8.9  5.8   RBC 4.20 - 5.80 Million/uL 4.60  4.43  3.94   Hemoglobin 13.2 - 17.1 g/dL 86.9  86.8  88.2   HCT 38.5 - 50.0 % 40.4  39.5  35.6   Platelets  140 - 400 Thousand/uL 355  359  314   NEUT# 1,500 - 7,800 cells/uL 4,172  6,924       There is no height or weight on file to calculate BMI.  Orders:  No orders of the defined types were placed in this encounter.  No orders of the defined types were placed in this encounter.    Procedures: No procedures performed  Clinical Data: No additional findings.  ROS:  All other systems negative, except as noted in the HPI. Review of Systems  Objective: Vital Signs: There were no vitals taken for this visit.  Specialty Comments:  No specialty comments available.  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) 03/10/2024   History of seizure disorder 03/08/2024   CKD stage 3a, GFR 45-59 ml/min (HCC)  03/08/2024   Sepsis (HCC) 03/08/2024   Obesity, Class III, BMI 40-49.9 (morbid obesity) 03/08/2024   OSA (obstructive sleep apnea) 03/08/2024   Cellulitis 03/07/2024   Controlled type 2 diabetes mellitus without complication, without long-term current use of insulin  (HCC) 02/18/2024   Hyperlipidemia 02/18/2024   Cellulitis of left lower extremity 02/14/2024   Aftercare following surgery of the circulatory system 08/28/2014   Varicose veins of leg with complications 08/07/2014   Varicose veins of bilateral lower extremities with other complications 05/01/2014   Past Medical History:  Diagnosis Date   Acid reflux    Diabetes mellitus without complication (HCC)    Seizures (HCC)    Varicose veins     Family History  Problem Relation Age of Onset   Hypertension Mother    COPD Mother    Cancer Mother 46       breast   Hypertension Father    Hypertension Sister     Past Surgical History:  Procedure Laterality Date   APPLICATION OF WOUND VAC Left 03/10/2024   Procedure: APPLICATION, WOUND VAC;  Surgeon: Harden Jerona GAILS, MD;  Location: [facility] OR;  Service: Orthopedics;  Laterality: Left;   HERNIA REPAIR     INCISION AND DRAINAGE OF DEEP ABSCESS, CALF Left 03/10/2024   Procedure: LEFT CALF DEBRIDEMENT;  Surgeon: Harden Jerona GAILS, MD;  Location: [facility] OR;  Service: Orthopedics;  Laterality: Left;  LEFT LEG DEBRIDEMENT   INCISION AND DRAINAGE OF DEEP ABSCESS, CALF Left 03/15/2024   Procedure: INCISION AND DRAINAGE OF DEEP ABSCESS, CALF;  Surgeon: Harden Jerona GAILS, MD;  Location: [facility] OR;  Service: Orthopedics;  Laterality: Left;  DEBRIDEMENT LEFT LEG   INGUINAL HERNIA REPAIR Left 02/04/2015   Procedure: LAPAROSCOPIC LEFT  INGUINAL HERNIA REPAIR WITH MESH;  Surgeon: Lynda Leos, MD;  Location: [facility] ORS;  Service: General;  Laterality: Left;   INSERTION OF MESH Left 02/04/2015   Procedure: INSERTION OF MESH;  Surgeon: Lynda Leos, MD;  Location: [facility]S;  Service: General;  Laterality: Left;   KNEE  SURGERY     VARICOSE VEIN SURGERY     Social History   Occupational History   Not on file  Tobacco Use   Smoking status: Never   Smokeless tobacco: Not on file  Substance and Sexual Activity   Alcohol use: Yes    Alcohol/week: 12.0 standard drinks of alcohol    Types: 12 Cans of beer per week    Comment: approx 6 beers a week   Drug use: No   Sexual activity: Not on file

## 2024-07-17 ENCOUNTER — Encounter: Payer: Self-pay | Admitting: Orthopedic Surgery

## 2024-07-20 ENCOUNTER — Encounter: Payer: Self-pay | Admitting: Physician Assistant

## 2024-07-20 ENCOUNTER — Ambulatory Visit: Admitting: Physician Assistant

## 2024-07-20 DIAGNOSIS — L97924 Non-pressure chronic ulcer of unspecified part of left lower leg with necrosis of bone: Secondary | ICD-10-CM

## 2024-07-20 NOTE — Progress Notes (Signed)
 Office Visit Note   Patient: Joshua Conner           Date of Birth: [birth year]           MRN: [id number] Visit Date: 07/20/2024              Requested by: Okey Carlin Redbird, MD [address] [place],  [place] [address] PCP: Okey Carlin Redbird, MD  Chief Complaint  Patient presents with   Left Leg - Follow-up    03/10/2024 and 03/15/2024 I&D of left leg x 2      HPI: Patient is a 53 year old gentleman who presents 2 months status post surgical debridement for abscess left lower extremity. Patient had Kerecis applied in the office.   He is here for follow up.  He has used Vive compression with Vashe cleaning after showers with soap and water. Elevation when at rest.  He denies fever and chills.  He does have clear drainage.  He is pleased wit his progress and the wound appears to be getting smaller with time.    Assessment & Plan: Visit Diagnoses: No diagnosis found.  Plan: Continue Vive sock against the wound, 4 x 4 and ace wrap for drainage.    Follow-Up Instructions: Return in about 4 weeks (around 08/17/2024).   Ortho Exam  Patient is alert, oriented, no adenopathy, well-dressed, normal affect, normal respiratory effort. 3 x 6.4 cm wound with 100 % granulation tissue.  The wound bed was cleaned with Vashe and 4 x 4.      Imaging: No results found.   Labs: Lab Results  Component Value Date   HGBA1C 7.0 (H) 03/07/2024   HGBA1C 6.3 (H) 02/15/2024   ESRSEDRATE 6 05/24/2024   ESRSEDRATE 9 03/28/2024   CRP 8.9 (H) 05/24/2024   CRP 9.2 (H) 03/28/2024   CRP 8.5 (H) 02/16/2024   REPTSTATUS 03/15/2024 FINAL 03/10/2024   GRAMSTAIN  03/10/2024    FEW WBC PRESENT, PREDOMINANTLY PMN RARE GRAM NEGATIVE RODS    CULT  03/10/2024    FEW ENTEROBACTER CLOACAE NO ANAEROBES ISOLATED Performed at [facility], [address], [place], [place] [address]    LABORGA ENTEROBACTER CLOACAE 03/10/2024     Lab Results  Component Value Date   ALBUMIN 3.3 (L)  03/08/2024   ALBUMIN 4.0 03/07/2024   ALBUMIN 3.9 02/14/2024    Lab Results  Component Value Date   MG 2.0 03/08/2024   MG 2.1 02/16/2024   MG 2.2 02/15/2024   No results found for: VD25OH  No results found for: PREALBUMIN    Latest Ref Rng & Units 05/24/2024   11:17 AM 03/28/2024    3:05 AM 03/15/2024    3:32 AM  CBC EXTENDED  WBC 3.8 - 10.8 Thousand/uL 6.1  8.9  5.8   RBC 4.20 - 5.80 Million/uL 4.60  4.43  3.94   Hemoglobin 13.2 - 17.1 g/dL 86.9  86.8  88.2   HCT 38.5 - 50.0 % 40.4  39.5  35.6   Platelets 140 - 400 Thousand/uL 355  359  314   NEUT# 1,500 - 7,800 cells/uL 4,172  6,924       There is no height or weight on file to calculate BMI.  Orders:  No orders of the defined types were placed in this encounter.  No orders of the defined types were placed in this encounter.    Procedures: No procedures performed  Clinical Data: No additional findings.  ROS:  All other systems  negative, except as noted in the HPI. Review of Systems  Objective: Vital Signs: There were no vitals taken for this visit.  Specialty Comments:  No specialty comments available.  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) 03/10/2024   History of seizure disorder 03/08/2024   CKD stage 3a, GFR 45-59 ml/min (HCC) 03/08/2024   Sepsis (HCC) 03/08/2024   Obesity, Class III, BMI 40-49.9 (morbid obesity) 03/08/2024   OSA (obstructive sleep apnea) 03/08/2024   Cellulitis 03/07/2024   Controlled type 2 diabetes mellitus without complication, without long-term current use of insulin  (HCC) 02/18/2024   Hyperlipidemia 02/18/2024   Cellulitis of left lower extremity 02/14/2024   Aftercare following surgery of the circulatory system 08/28/2014   Varicose veins of leg with complications 08/07/2014   Varicose veins of bilateral lower extremities with other complications  05/01/2014   Past Medical History:  Diagnosis Date   Acid reflux    Diabetes mellitus without complication (HCC)    Seizures (HCC)    Varicose veins     Family History  Problem Relation Age of Onset   Hypertension Mother    COPD Mother    Cancer Mother 74       breast   Hypertension Father    Hypertension Sister     Past Surgical History:  Procedure Laterality Date   APPLICATION OF WOUND VAC Left 03/10/2024   Procedure: APPLICATION, WOUND VAC;  Surgeon: Harden Jerona GAILS, MD;  Location: [facility] OR;  Service: Orthopedics;  Laterality: Left;   HERNIA REPAIR     INCISION AND DRAINAGE OF DEEP ABSCESS, CALF Left 03/10/2024   Procedure: LEFT CALF DEBRIDEMENT;  Surgeon: Harden Jerona GAILS, MD;  Location: [facility] OR;  Service: Orthopedics;  Laterality: Left;  LEFT LEG DEBRIDEMENT   INCISION AND DRAINAGE OF DEEP ABSCESS, CALF Left 03/15/2024   Procedure: INCISION AND DRAINAGE OF DEEP ABSCESS, CALF;  Surgeon: Harden Jerona GAILS, MD;  Location: [facility] OR;  Service: Orthopedics;  Laterality: Left;  DEBRIDEMENT LEFT LEG   INGUINAL HERNIA REPAIR Left 02/04/2015   Procedure: LAPAROSCOPIC LEFT  INGUINAL HERNIA REPAIR WITH MESH;  Surgeon: Lynda Leos, MD;  Location: [facility];  Service: General;  Laterality: Left;   INSERTION OF MESH Left 02/04/2015   Procedure: INSERTION OF MESH;  Surgeon: Lynda Leos, MD;  Location: [facility];  Service: General;  Laterality: Left;   KNEE SURGERY     VARICOSE VEIN SURGERY     Social History   Occupational History   Not on file  Tobacco Use   Smoking status: Never   Smokeless tobacco: Not on file  Substance and Sexual Activity   Alcohol use: Yes    Alcohol/week: 12.0 standard drinks of alcohol    Types: 12 Cans of beer per week    Comment: approx 6 beers a week   Drug use: No   Sexual activity: Not on file

## 2024-08-17 ENCOUNTER — Ambulatory Visit (INDEPENDENT_AMBULATORY_CARE_PROVIDER_SITE_OTHER): Admitting: Orthopedic Surgery

## 2024-08-17 DIAGNOSIS — L97924 Non-pressure chronic ulcer of unspecified part of left lower leg with necrosis of bone: Secondary | ICD-10-CM | POA: Diagnosis not present

## 2024-08-17 DIAGNOSIS — L03116 Cellulitis of left lower limb: Secondary | ICD-10-CM | POA: Diagnosis not present

## 2024-08-21 ENCOUNTER — Encounter: Payer: Self-pay | Admitting: Orthopedic Surgery

## 2024-08-21 NOTE — Progress Notes (Signed)
 Office Visit Note   Patient: Joshua Conner           Date of Birth: Nov 18, 1970           MRN: [id number] Visit Date: 08/17/2024              Requested by: Okey Carlin Redbird, MD [address] [place],  [place] [address] PCP: Okey Carlin Redbird, MD  Chief Complaint  Patient presents with   Left Leg - Wound Check    03/10/2024 and 03/15/2024 I&D of left leg      HPI: Discussed the use of AI scribe software for clinical note transcription with the patient, who gave verbal consent to proceed.  History of Present Illness Joshua Conner is a 53 year old male who presents for follow-up of a leg ulcer.  He is currently using Vashe every morning after showering and wears a knee-high compression sock. He allows the area to dry after applying Vashe.  Patient is 5 months status post serial debridement left leg.     Assessment & Plan: Visit Diagnoses:  1. 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 Cellulitis of left lower extremity     Plan: Assessment and Plan Assessment & Plan Chronic lower extremity ulcer Ulcer improving with current treatment, not expected to fully heal in four weeks but should reduce in size. - Continue Vashe daily post-shower. - Continue knee-high compression sock. - Staff to photograph ulcer. - Follow-up in four weeks.      Follow-Up Instructions: Return in about 4 weeks (around 09/14/2024).   Ortho Exam  Patient is alert, oriented, no adenopathy, well-dressed, normal affect, normal respiratory effort. Physical Exam SKIN: Ulcer measuring 5.5x2.5 cm with islands of epithelialization and flat granulation tissue.      Imaging: No results found.   Labs: Lab Results  Component Value Date   HGBA1C 7.0 (H) 03/07/2024   HGBA1C 6.3 (H) 02/15/2024   ESRSEDRATE 6 05/24/2024   ESRSEDRATE 9 03/28/2024   CRP 8.9 (H) 05/24/2024   CRP 9.2 (H) 03/28/2024   CRP 8.5 (H) 02/16/2024   REPTSTATUS 03/15/2024 FINAL 03/10/2024    GRAMSTAIN  03/10/2024    FEW WBC PRESENT, PREDOMINANTLY PMN RARE GRAM NEGATIVE RODS    CULT  03/10/2024    FEW ENTEROBACTER CLOACAE NO ANAEROBES ISOLATED Performed at [facility], [address], [place], [place] [address]    LABORGA ENTEROBACTER CLOACAE 03/10/2024     Lab Results  Component Value Date   ALBUMIN 3.3 (L) 03/08/2024   ALBUMIN 4.0 03/07/2024   ALBUMIN 3.9 02/14/2024    Lab Results  Component Value Date   MG 2.0 03/08/2024   MG 2.1 02/16/2024   MG 2.2 02/15/2024   No results found for: VD25OH  No results found for: PREALBUMIN    Latest Ref Rng & Units 05/24/2024   11:17 AM 03/28/2024    3:05 AM 03/15/2024    3:32 AM  CBC EXTENDED  WBC 3.8 - 10.8 Thousand/uL 6.1  8.9  5.8   RBC 4.20 - 5.80 Million/uL 4.60  4.43  3.94   Hemoglobin 13.2 - 17.1 g/dL 86.9  86.8  88.2   HCT 38.5 - 50.0 % 40.4  39.5  35.6   Platelets 140 - 400 Thousand/uL 355  359  314   NEUT# 1,500 - 7,800 cells/uL 4,172  6,924       There is no height or weight on file to  calculate BMI.  Orders:  No orders of the defined types were placed in this encounter.  No orders of the defined types were placed in this encounter.    Procedures: No procedures performed  Clinical Data: No additional findings.  ROS:  All other systems negative, except as noted in the HPI. Review of Systems  Objective: Vital Signs: There were no vitals taken for this visit.  Specialty Comments:  No specialty comments available.  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) 03/10/2024   History of seizure disorder 03/08/2024   CKD stage 3a, GFR 45-59 ml/min (HCC) 03/08/2024   Sepsis (HCC) 03/08/2024   Obesity, Class III, BMI 40-49.9 (morbid obesity) (HCC) 03/08/2024   OSA (obstructive sleep apnea) 03/08/2024   Cellulitis 03/07/2024   Controlled type 2 diabetes mellitus  without complication, without long-term current use of insulin  (HCC) 02/18/2024   Hyperlipidemia 02/18/2024   Cellulitis of left lower extremity 02/14/2024   Aftercare following surgery of the circulatory system 08/28/2014   Varicose veins of leg with complications 08/07/2014   Varicose veins of bilateral lower extremities with other complications 05/01/2014   Past Medical History:  Diagnosis Date   Acid reflux    Diabetes mellitus without complication (HCC)    Seizures (HCC)    Varicose veins     Family History  Problem Relation Age of Onset   Hypertension Mother    COPD Mother    Cancer Mother 69       breast   Hypertension Father    Hypertension Sister     Past Surgical History:  Procedure Laterality Date   APPLICATION OF WOUND VAC Left 03/10/2024   Procedure: APPLICATION, WOUND VAC;  Surgeon: Harden Jerona GAILS, MD;  Location: [facility] OR;  Service: Orthopedics;  Laterality: Left;   HERNIA REPAIR     INCISION AND DRAINAGE OF DEEP ABSCESS, CALF Left 03/10/2024   Procedure: LEFT CALF DEBRIDEMENT;  Surgeon: Harden Jerona GAILS, MD;  Location: [facility] OR;  Service: Orthopedics;  Laterality: Left;  LEFT LEG DEBRIDEMENT   INCISION AND DRAINAGE OF DEEP ABSCESS, CALF Left 03/15/2024   Procedure: INCISION AND DRAINAGE OF DEEP ABSCESS, CALF;  Surgeon: Harden Jerona GAILS, MD;  Location: [facility] OR;  Service: Orthopedics;  Laterality: Left;  DEBRIDEMENT LEFT LEG   INGUINAL HERNIA REPAIR Left 02/04/2015   Procedure: LAPAROSCOPIC LEFT  INGUINAL HERNIA REPAIR WITH MESH;  Surgeon: Lynda Leos, MD;  Location: [facility];  Service: General;  Laterality: Left;   INSERTION OF MESH Left 02/04/2015   Procedure: INSERTION OF MESH;  Surgeon: Lynda Leos, MD;  Location: [facility];  Service: General;  Laterality: Left;   KNEE SURGERY     VARICOSE VEIN SURGERY     Social History   Occupational History   Not on file  Tobacco Use   Smoking status: Never   Smokeless tobacco: Not on file  Substance and Sexual Activity   Alcohol  use: Yes    Alcohol/week: 12.0 standard drinks of alcohol    Types: 12 Cans of beer per week    Comment: approx 6 beers a week   Drug use: No   Sexual activity: Not on file

## 2024-08-25 ENCOUNTER — Telehealth: Payer: Self-pay | Admitting: Orthopedic Surgery

## 2024-08-25 NOTE — Telephone Encounter (Signed)
 CALLED PT LEFT VM PROVIDER OUT OF OFFICE NEED TO R/S/ with Harden.Rocky Peters

## 2024-08-28 ENCOUNTER — Encounter: Payer: Self-pay | Admitting: Radiology

## 2024-08-29 ENCOUNTER — Ambulatory Visit: Admitting: Orthopedic Surgery

## 2024-08-29 DIAGNOSIS — L97924 Non-pressure chronic ulcer of unspecified part of left lower leg with necrosis of bone: Secondary | ICD-10-CM

## 2024-08-30 ENCOUNTER — Encounter: Payer: Self-pay | Admitting: Orthopedic Surgery

## 2024-08-30 NOTE — Progress Notes (Signed)
 Office Visit Note   Patient: Joshua Conner           Date of Birth: 05-12-1971           MRN: [id number] Visit Date: 08/29/2024              Requested by: Okey Carlin Redbird, MD [address] [place],  [place] [address] PCP: Okey Carlin Redbird, MD  Chief Complaint  Patient presents with   Left Leg - Wound Check      HPI: Discussed the use of AI scribe software for clinical note transcription with the patient, who gave verbal consent to proceed.  History of Present Illness Joshua Conner is a 53 year old male who presents for wound care management.  He has a wound measuring 6.5 by 2.5 centimeters. The wound care involves the application of platelet-derived growth factor paired with Vashe and four by four gauze, followed by a dry dressing. Patient presents in follow-up for chronic ulceration left anterior tibia.    Assessment & Plan: Visit Diagnoses:  1. 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 No significant debridement needed. - Keep wound dry for three days. - Allow showering after three days. - Uncap dressing after three days. - Apply platelet-derived growth factor with dry dressing.  Patient underwent application of platelet derived growth factor.  He will start dressing changes in 3 days with 4 x 4 gauze and Vashe and in 4 days resume using his medical compression stocking.    Follow-Up Instructions: Return in about 1 week (around 09/05/2024).   Ortho Exam  Patient is alert, oriented, no adenopathy, well-dressed, normal affect, normal respiratory effort. Physical Exam SKIN: Wound measuring 6.5x2.5 cm with flat, healthy granulation tissue and a small island of epithelialization.   Examination the patient has healthy granulation tissue.  This was debrided with Vashe back to  bleeding viable granulation tissue.  Patient underwent application of platelet derived growth factor with a sterile dressing applied.   Imaging: No results found.    Labs: Lab Results  Component Value Date   HGBA1C 7.0 (H) 03/07/2024   HGBA1C 6.3 (H) 02/15/2024   ESRSEDRATE 6 05/24/2024   ESRSEDRATE 9 03/28/2024   CRP 8.9 (H) 05/24/2024   CRP 9.2 (H) 03/28/2024   CRP 8.5 (H) 02/16/2024   REPTSTATUS 03/15/2024 FINAL 03/10/2024   GRAMSTAIN  03/10/2024    FEW WBC PRESENT, PREDOMINANTLY PMN RARE GRAM NEGATIVE RODS    CULT  03/10/2024    FEW ENTEROBACTER CLOACAE NO ANAEROBES ISOLATED Performed at [facility], [address], [place], [place] [address]    LABORGA ENTEROBACTER CLOACAE 03/10/2024     Lab Results  Component Value Date   ALBUMIN 3.3 (L) 03/08/2024   ALBUMIN 4.0 03/07/2024   ALBUMIN 3.9 02/14/2024    Lab Results  Component Value Date   MG 2.0 03/08/2024   MG 2.1 02/16/2024   MG 2.2 02/15/2024   No results found for: VD25OH  No results found for: PREALBUMIN    Latest Ref Rng & Units 05/24/2024   11:17 AM 03/28/2024    3:05 AM 03/15/2024    3:32 AM  CBC EXTENDED  WBC 3.8 - 10.8 Thousand/uL 6.1  8.9  5.8   RBC 4.20 - 5.80 Million/uL 4.60  4.43  3.94   Hemoglobin  13.2 - 17.1 g/dL 86.9  86.8  88.2   HCT 38.5 - 50.0 % 40.4  39.5  35.6   Platelets 140 - 400 Thousand/uL 355  359  314   NEUT# 1,500 - 7,800 cells/uL 4,172  6,924       There is no height or weight on file to calculate BMI.  Orders:  No orders of the defined types were placed in this encounter.  No orders of the defined types were placed in this encounter.    Procedures: No procedures performed  Clinical Data: No additional findings.  ROS:  All other systems negative, except as noted in the HPI. Review of Systems  Objective: Vital Signs: There were no vitals taken for this visit.  Specialty Comments:  No specialty comments available.  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) 03/10/2024   History of seizure disorder 03/08/2024   CKD stage 3a, GFR 45-59 ml/min (HCC) 03/08/2024   Sepsis (HCC) 03/08/2024   Obesity, Class III, BMI 40-49.9 (morbid obesity) (HCC) 03/08/2024   OSA (obstructive sleep apnea) 03/08/2024   Cellulitis 03/07/2024   Controlled type 2 diabetes mellitus without complication, without long-term current use of insulin  (HCC) 02/18/2024   Hyperlipidemia 02/18/2024   Cellulitis of left lower extremity 02/14/2024   Aftercare following surgery of the circulatory system 08/28/2014   Varicose veins of leg with complications 08/07/2014   Varicose veins of bilateral lower extremities with other complications 05/01/2014   Past Medical History:  Diagnosis Date   Acid reflux    Diabetes mellitus without complication (HCC)    Seizures (HCC)    Varicose veins     Family History  Problem Relation Age of Onset   Hypertension Mother    COPD Mother    Cancer Mother 65       breast   Hypertension Father    Hypertension Sister     Past Surgical History:  Procedure Laterality Date   APPLICATION OF WOUND VAC Left 03/10/2024   Procedure: APPLICATION, WOUND VAC;  Surgeon: Harden Jerona GAILS, MD;  Location: [facility] OR;  Service: Orthopedics;  Laterality: Left;   HERNIA REPAIR     INCISION AND DRAINAGE OF DEEP ABSCESS, CALF Left 03/10/2024   Procedure: LEFT CALF DEBRIDEMENT;  Surgeon: Harden Jerona GAILS, MD;  Location: [facility] OR;  Service: Orthopedics;  Laterality: Left;  LEFT LEG DEBRIDEMENT   INCISION AND DRAINAGE OF DEEP ABSCESS, CALF Left 03/15/2024   Procedure: INCISION AND DRAINAGE OF DEEP ABSCESS, CALF;  Surgeon: Harden Jerona GAILS, MD;  Location: [facility] OR;  Service: Orthopedics;  Laterality: Left;  DEBRIDEMENT LEFT LEG   INGUINAL HERNIA REPAIR Left 02/04/2015   Procedure: LAPAROSCOPIC LEFT  INGUINAL HERNIA REPAIR WITH MESH;  Surgeon: Lynda Leos, MD;  Location: [facility];  Service: General;  Laterality: Left;   INSERTION OF MESH Left 02/04/2015   Procedure: INSERTION OF MESH;  Surgeon: Lynda Leos, MD;  Location: [facility];  Service: General;  Laterality: Left;   KNEE SURGERY     VARICOSE VEIN SURGERY     Social History   Occupational History   Not on file  Tobacco Use   Smoking status: Never   Smokeless tobacco: Not on file  Substance and Sexual Activity   Alcohol use: Yes    Alcohol/week: 12.0 standard drinks of alcohol    Types: 12 Cans of beer per  week    Comment: approx 6 beers a week   Drug use: No   Sexual activity: Not on file

## 2024-09-05 ENCOUNTER — Ambulatory Visit: Admitting: Orthopedic Surgery

## 2024-09-05 ENCOUNTER — Encounter: Payer: Self-pay | Admitting: Orthopedic Surgery

## 2024-09-05 DIAGNOSIS — L97924 Non-pressure chronic ulcer of unspecified part of left lower leg with necrosis of bone: Secondary | ICD-10-CM

## 2024-09-05 NOTE — Progress Notes (Signed)
 "  Office Visit Note   Patient: Joshua Conner           Date of Birth: 1971/09/15           MRN: [id number] Visit Date: 09/05/2024              Requested by: Okey Carlin Redbird, MD [address] [place],  [place] [address] PCP: Okey Carlin Redbird, MD  Chief Complaint  Patient presents with   Left Leg - Wound Check      HPI: Discussed the use of AI scribe software for clinical note transcription with the patient, who gave verbal consent to proceed.  History of Present Illness He is a 53 year old male who presents with a left leg wound with healthy granulation tissue.  He has a wound on his left leg measuring approximately 5.5 by 2 centimeters. The wound is characterized by healthy granulation tissue.  Patient is seen in follow-up for chronic ulceration left leg.  Patient underwent a trial of PRP treatment  Last week.  Assessment & Plan: Visit Diagnoses: No diagnosis found.  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 Underlying osteonecrosis present. - Continue current wound care regimen. Plan for a repeat PRP injection today.  There was a complication with the PRP delivery system and patient will return for follow-up PRP wound care treatment     Follow-Up Instructions: No follow-ups on file.   Ortho Exam  Patient is alert, oriented, no adenopathy, well-dressed, normal affect, normal respiratory effort. Physical Exam MUSCULOSKELETAL: Left leg wound 5.5 x 2 cm with healthy granulation tissue. Examination the wound has flat healthy granulation tissue there is some superficial epithelization around the wound edges.  Wound measurement 5.5 x 2.5 cm    Imaging: No results found.    Labs: Lab Results  Component Value Date   HGBA1C 7.0 (H) 03/07/2024   HGBA1C 6.3 (H) 02/15/2024   ESRSEDRATE 6 05/24/2024   ESRSEDRATE 9  03/28/2024   CRP 8.9 (H) 05/24/2024   CRP 9.2 (H) 03/28/2024   CRP 8.5 (H) 02/16/2024   REPTSTATUS 03/15/2024 FINAL 03/10/2024   GRAMSTAIN  03/10/2024    FEW WBC PRESENT, PREDOMINANTLY PMN RARE GRAM NEGATIVE RODS    CULT  03/10/2024    FEW ENTEROBACTER CLOACAE NO ANAEROBES ISOLATED Performed at [facility], [address], [place], [place] [address]    LABORGA ENTEROBACTER CLOACAE 03/10/2024     Lab Results  Component Value Date   ALBUMIN 3.3 (L) 03/08/2024   ALBUMIN 4.0 03/07/2024   ALBUMIN 3.9 02/14/2024    Lab Results  Component Value Date   MG 2.0 03/08/2024   MG 2.1 02/16/2024   MG 2.2 02/15/2024   No results found for: VD25OH  No results found for: PREALBUMIN    Latest Ref Rng & Units 05/24/2024   11:17 AM 03/28/2024    3:05 AM 03/15/2024    3:32 AM  CBC EXTENDED  WBC 3.8 - 10.8 Thousand/uL 6.1  8.9  5.8   RBC 4.20 - 5.80 Million/uL 4.60  4.43  3.94   Hemoglobin 13.2 - 17.1 g/dL 86.9  86.8  88.2   HCT 38.5 - 50.0 % 40.4  39.5  35.6   Platelets 140 - 400 Thousand/uL 355  359  314   NEUT# 1,500 - 7,800 cells/uL 4,172  6,924       There is  no height or weight on file to calculate BMI.  Orders:  No orders of the defined types were placed in this encounter.  No orders of the defined types were placed in this encounter.    Procedures: No procedures performed  Clinical Data: No additional findings.  ROS:  All other systems negative, except as noted in the HPI. Review of Systems  Objective: Vital Signs: There were no vitals taken for this visit.  Specialty Comments:  No specialty comments available.  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) 03/10/2024   History of seizure disorder 03/08/2024   CKD stage 3a, GFR 45-59 ml/min (HCC) 03/08/2024   Sepsis (HCC) 03/08/2024   Obesity, Class III, BMI 40-49.9 (morbid obesity)  (HCC) 03/08/2024   OSA (obstructive sleep apnea) 03/08/2024   Cellulitis 03/07/2024   Controlled type 2 diabetes mellitus without complication, without long-term current use of insulin  (HCC) 02/18/2024   Hyperlipidemia 02/18/2024   Cellulitis of left lower extremity 02/14/2024   Aftercare following surgery of the circulatory system 08/28/2014   Varicose veins of leg with complications 08/07/2014   Varicose veins of bilateral lower extremities with other complications 05/01/2014   Past Medical History:  Diagnosis Date   Acid reflux    Diabetes mellitus without complication (HCC)    Seizures (HCC)    Varicose veins     Family History  Problem Relation Age of Onset   Hypertension Mother    COPD Mother    Cancer Mother 18       breast   Hypertension Father    Hypertension Sister     Past Surgical History:  Procedure Laterality Date   APPLICATION OF WOUND VAC Left 03/10/2024   Procedure: APPLICATION, WOUND VAC;  Surgeon: Harden Jerona GAILS, MD;  Location: [facility] OR;  Service: Orthopedics;  Laterality: Left;   HERNIA REPAIR     INCISION AND DRAINAGE OF DEEP ABSCESS, CALF Left 03/10/2024   Procedure: LEFT CALF DEBRIDEMENT;  Surgeon: Harden Jerona GAILS, MD;  Location: [facility] OR;  Service: Orthopedics;  Laterality: Left;  LEFT LEG DEBRIDEMENT   INCISION AND DRAINAGE OF DEEP ABSCESS, CALF Left 03/15/2024   Procedure: INCISION AND DRAINAGE OF DEEP ABSCESS, CALF;  Surgeon: Harden Jerona GAILS, MD;  Location: [facility];  Service: Orthopedics;  Laterality: Left;  DEBRIDEMENT LEFT LEG   INGUINAL HERNIA REPAIR Left 02/04/2015   Procedure: LAPAROSCOPIC LEFT  INGUINAL HERNIA REPAIR WITH MESH;  Surgeon: Lynda Leos, MD;  Location: [facility];  Service: General;  Laterality: Left;   INSERTION OF MESH Left 02/04/2015   Procedure: INSERTION OF MESH;  Surgeon: Lynda Leos, MD;  Location: [facility];  Service: General;  Laterality: Left;   KNEE SURGERY     VARICOSE VEIN SURGERY     Social History   Occupational History   Not  on file  Tobacco Use   Smoking status: Never   Smokeless tobacco: Not on file  Substance and Sexual Activity   Alcohol use: Yes    Alcohol/week: 12.0 standard drinks of alcohol    Types: 12 Cans of beer per week    Comment: approx 6 beers a week   Drug use: No   Sexual activity: Not on file         "

## 2024-09-06 ENCOUNTER — Encounter: Payer: Self-pay | Admitting: Orthopedic Surgery

## 2024-09-12 ENCOUNTER — Ambulatory Visit (INDEPENDENT_AMBULATORY_CARE_PROVIDER_SITE_OTHER): Admitting: Physician Assistant

## 2024-09-12 DIAGNOSIS — L97924 Non-pressure chronic ulcer of unspecified part of left lower leg with necrosis of bone: Secondary | ICD-10-CM | POA: Diagnosis not present

## 2024-09-12 NOTE — Progress Notes (Unsigned)
 Office Visit Note   Patient: Joshua Conner           Date of Birth: 05/13/1971           MRN: [id number] Visit Date: 09/12/2024              Requested by: Okey Carlin Redbird, MD [address] [place],  [place] [address] PCP: Okey Carlin Redbird, MD  No chief complaint on file.     HPI: 53 y/o male with history of left anterior shin wound that is chronic an stagnant.    Assessment & Plan: Visit Diagnoses: No diagnosis found.  Plan: ***  Follow-Up Instructions: No follow-ups on file.   Ortho Exam  Patient is alert, oriented, no adenopathy, well-dressed, normal affect, normal respiratory effort.    Measures 1.8 cm wide and 5.8 cm with 2 skin islands medial and proximal lateral.    Imaging: No results found.   Labs: Lab Results  Component Value Date   HGBA1C 7.0 (H) 03/07/2024   HGBA1C 6.3 (H) 02/15/2024   ESRSEDRATE 6 05/24/2024   ESRSEDRATE 9 03/28/2024   CRP 8.9 (H) 05/24/2024   CRP 9.2 (H) 03/28/2024   CRP 8.5 (H) 02/16/2024   REPTSTATUS 03/15/2024 FINAL 03/10/2024   GRAMSTAIN  03/10/2024    FEW WBC PRESENT, PREDOMINANTLY PMN RARE GRAM NEGATIVE RODS    CULT  03/10/2024    FEW ENTEROBACTER CLOACAE NO ANAEROBES ISOLATED Performed at [facility], [address] [place], [place] [address]    LABORGA ENTEROBACTER CLOACAE 03/10/2024     Lab Results  Component Value Date   ALBUMIN 3.3 (L) 03/08/2024   ALBUMIN 4.0 03/07/2024   ALBUMIN 3.9 02/14/2024    Lab Results  Component Value Date   MG 2.0 03/08/2024   MG 2.1 02/16/2024   MG 2.2 02/15/2024   No results found for: VD25OH  No results found for: PREALBUMIN    Latest Ref Rng & Units 05/24/2024   11:17 AM 03/28/2024    3:05 AM 03/15/2024    3:32 AM  CBC EXTENDED  WBC 3.8 - 10.8 Thousand/uL 6.1  8.9  5.8   RBC 4.20 - 5.80 Million/uL 4.60  4.43  3.94   Hemoglobin 13.2 - 17.1 g/dL 86.9  86.8  88.2   HCT 38.5 - 50.0 % 40.4  39.5  35.6   Platelets 140 - 400 Thousand/uL 355  359   314   NEUT# 1,500 - 7,800 cells/uL 4,172  6,924       There is no height or weight on file to calculate BMI.  Orders:  No orders of the defined types were placed in this encounter.  No orders of the defined types were placed in this encounter.    Procedures: No procedures performed  Clinical Data: No additional findings.  ROS:  All other systems negative, except as noted in the HPI. Review of Systems  Objective: Vital Signs: There were no vitals taken for this visit.  Specialty Comments:  No specialty comments available.  PMFS History: Patient Active Problem List   Diagnosis Date Noted  . Osteomyelitis (HCC) 03/14/2024  . 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 History of seizure disorder 03/08/2024  . CKD stage 3a, GFR 45-59 ml/min (HCC) 03/08/2024  . Sepsis (HCC) 03/08/2024  . Obesity, Class III, BMI 40-49.9 (morbid obesity) (HCC) 03/08/2024  . OSA (obstructive sleep apnea) 03/08/2024  . Cellulitis 03/07/2024  . Controlled type 2  diabetes mellitus without complication, without long-term current use of insulin  (HCC) 02/18/2024  . Hyperlipidemia 02/18/2024  . Cellulitis of left lower extremity 02/14/2024  . Aftercare following surgery of the circulatory system 08/28/2014  . Varicose veins of leg with complications 08/07/2014  . Varicose veins of bilateral lower extremities with other complications 05/01/2014   Past Medical History:  Diagnosis Date  . Acid reflux   . Diabetes mellitus without complication (HCC)   . Seizures (HCC)   . Varicose veins     Family History  Problem Relation Age of Onset  . Hypertension Mother   . COPD Mother   . Cancer Mother 47       breast  . Hypertension Father   . Hypertension Sister     Past Surgical History:  Procedure Laterality Date  . APPLICATION OF WOUND VAC Left 03/10/2024   Procedure: APPLICATION, WOUND VAC;  Surgeon: Harden Jerona GAILS, MD;  Location: [facility] OR;  Service: Orthopedics;   Laterality: Left;  . HERNIA REPAIR    . INCISION AND DRAINAGE OF DEEP ABSCESS, CALF Left 03/10/2024   Procedure: LEFT CALF DEBRIDEMENT;  Surgeon: Harden Jerona GAILS, MD;  [facility] OR;  Service: Orthopedics;  Laterality: Left;  LEFT LEG DEBRIDEMENT  . INCISION AND DRAINAGE OF DEEP ABSCESS, CALF Left 03/15/2024   Procedure: INCISION AND DRAINAGE OF DEEP ABSCESS, CALF;  Surgeon: Harden Jerona GAILS, MD;  Location: [facility];  Service: Orthopedics;  Laterality: Left;  DEBRIDEMENT LEFT LEG  . INGUINAL HERNIA REPAIR Left 02/04/2015   Procedure: LAPAROSCOPIC LEFT  INGUINAL HERNIA REPAIR WITH MESH;  Surgeon: Lynda Leos, MD;  Location: [facility];  Service: General;  Laterality: Left;  . INSERTION OF MESH Left 02/04/2015   Procedure: INSERTION OF MESH;  Surgeon: Lynda Leos, MD;  Location: [facility];  Service: General;  Laterality: Left;  . KNEE SURGERY    . VARICOSE VEIN SURGERY     Social History   Occupational History  . Not on file  Tobacco Use  . Smoking status: Never  . Smokeless tobacco: Not on file  Substance and Sexual Activity  . Alcohol use: Yes    Alcohol/week: 12.0 standard drinks of alcohol    Types: 12 Cans of beer per week    Comment: approx 6 beers a week  . Drug use: No  . Sexual activity: Not on file

## 2024-09-13 ENCOUNTER — Encounter: Payer: Self-pay | Admitting: Physician Assistant

## 2024-09-14 ENCOUNTER — Ambulatory Visit: Admitting: Orthopedic Surgery

## 2024-09-19 ENCOUNTER — Encounter: Payer: Self-pay | Admitting: Physician Assistant

## 2024-09-19 ENCOUNTER — Ambulatory Visit: Admitting: Physician Assistant

## 2024-09-19 DIAGNOSIS — L97924 Non-pressure chronic ulcer of unspecified part of left lower leg with necrosis of bone: Secondary | ICD-10-CM | POA: Diagnosis not present

## 2024-09-19 NOTE — Progress Notes (Signed)
 Office Visit Note   Patient: Joshua Conner           Date of Birth: 23-Sep-1971           MRN: [id number] Visit Date: 09/19/2024              Requested by: Joshua Carlin Redbird, MD [address] [place],  [place] [address] PCP: Joshua Carlin Redbird, MD  Chief Complaint  Patient presents with   Left Leg - Wound Check      HPI: 53 y/o male with history of left anterior shin wound that is chronic and stagnant.  He has received a trial of PRP treatment x 3.  He receives application of platelet derived growth factor. He is here today for follow up.  Assessment & Plan: Visit Diagnoses: No diagnosis found.  Plan: We will place Donated Kerecis on the wound bed, adaptic and dry guaze.  He will wear the Vive sock over the dressing.  The wound has decreased in size from 1.8 cm wide and 5.8 cm to 5.5 cm x 1.5 cm.    Follow-Up Instructions: Return in about 1 week (around 09/26/2024).   Ortho Exam  Patient is alert, oriented, no adenopathy, well-dressed, normal affect, normal respiratory effort. The wound bed is 100 % granulation with 2 small skin islands.  It measures 5.5 cm x 1.5 cm.  He has a palpable DP pulse.      Imaging: No results found. No images are attached to the encounter.  Labs: Lab Results  Component Value Date   HGBA1C 7.0 (H) 03/07/2024   HGBA1C 6.3 (H) 02/15/2024   ESRSEDRATE 6 05/24/2024   ESRSEDRATE 9 03/28/2024   CRP 8.9 (H) 05/24/2024   CRP 9.2 (H) 03/28/2024   CRP 8.5 (H) 02/16/2024   REPTSTATUS 03/15/2024 FINAL 03/10/2024   GRAMSTAIN  03/10/2024    FEW WBC PRESENT, PREDOMINANTLY PMN RARE GRAM NEGATIVE RODS    CULT  03/10/2024    FEW ENTEROBACTER CLOACAE NO ANAEROBES ISOLATED Performed at [facility], [address] [place], [place] [address]    LABORGA ENTEROBACTER CLOACAE 03/10/2024     Lab Results  Component Value Date   ALBUMIN 3.3 (L) 03/08/2024   ALBUMIN 4.0 03/07/2024   ALBUMIN 3.9 02/14/2024    Lab Results  Component  Value Date   MG 2.0 03/08/2024   MG 2.1 02/16/2024   MG 2.2 02/15/2024   No results found for: VD25OH  No results found for: PREALBUMIN    Latest Ref Rng & Units 05/24/2024   11:17 AM 03/28/2024    3:05 AM 03/15/2024    3:32 AM  CBC EXTENDED  WBC 3.8 - 10.8 Thousand/uL 6.1  8.9  5.8   RBC 4.20 - 5.80 Million/uL 4.60  4.43  3.94   Hemoglobin 13.2 - 17.1 g/dL 86.9  86.8  88.2   HCT 38.5 - 50.0 % 40.4  39.5  35.6   Platelets 140 - 400 Thousand/uL 355  359  314   NEUT# 1,500 - 7,800 cells/uL 4,172  6,924       There is no height or weight on file to calculate BMI.  Orders:  No orders of the defined types were placed in this encounter.  No orders of the defined types were placed in this encounter.    Procedures: No procedures performed  Clinical Data: No additional findings.  ROS:  All other systems negative, except as noted in the HPI. Review of Systems  Objective:  Vital Signs: There were no vitals taken for this visit.  Specialty Comments:  No specialty comments available.  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) 03/10/2024   History of seizure disorder 03/08/2024   CKD stage 3a, GFR 45-59 ml/min (HCC) 03/08/2024   Sepsis (HCC) 03/08/2024   Obesity, Class III, BMI 40-49.9 (morbid obesity) (HCC) 03/08/2024   OSA (obstructive sleep apnea) 03/08/2024   Cellulitis 03/07/2024   Controlled type 2 diabetes mellitus without complication, without long-term current use of insulin  (HCC) 02/18/2024   Hyperlipidemia 02/18/2024   Cellulitis of left lower extremity 02/14/2024   Aftercare following surgery of the circulatory system 08/28/2014   Varicose veins of leg with complications 08/07/2014   Varicose veins of bilateral lower extremities with other complications 05/01/2014   Past Medical History:  Diagnosis Date   Acid reflux    Diabetes mellitus  without complication (HCC)    Seizures (HCC)    Varicose veins     Family History  Problem Relation Age of Onset   Hypertension Mother    COPD Mother    Cancer Mother 29       breast   Hypertension Father    Hypertension Sister     Past Surgical History:  Procedure Laterality Date   APPLICATION OF WOUND VAC Left 03/10/2024   Procedure: APPLICATION, WOUND VAC;  Surgeon: Harden Jerona GAILS, MD;  Location: [facility] OR;  Service: Orthopedics;  Laterality: Left;   HERNIA REPAIR     INCISION AND DRAINAGE OF DEEP ABSCESS, CALF Left 03/10/2024   Procedure: LEFT CALF DEBRIDEMENT;  Surgeon: Harden Jerona GAILS, MD;  Location: [facility] OR;  Service: Orthopedics;  Laterality: Left;  LEFT LEG DEBRIDEMENT   INCISION AND DRAINAGE OF DEEP ABSCESS, CALF Left 03/15/2024   Procedure: INCISION AND DRAINAGE OF DEEP ABSCESS, CALF;  Surgeon: Harden Jerona GAILS, MD;  Location: [facility] OR;  Service: Orthopedics;  Laterality: Left;  DEBRIDEMENT LEFT LEG   INGUINAL HERNIA REPAIR Left 02/04/2015   Procedure: LAPAROSCOPIC LEFT  INGUINAL HERNIA REPAIR WITH MESH;  Surgeon: Lynda Leos, MD;  Location: [facility];  Service: General;  Laterality: Left;   INSERTION OF MESH Left 02/04/2015   Procedure: INSERTION OF MESH;  Surgeon: Lynda Leos, MD;  Location: [facility];  Service: General;  Laterality: Left;   KNEE SURGERY     VARICOSE VEIN SURGERY     Social History   Occupational History   Not on file  Tobacco Use   Smoking status: Never   Smokeless tobacco: Not on file  Substance and Sexual Activity   Alcohol use: Yes    Alcohol/week: 12.0 standard drinks of alcohol    Types: 12 Cans of beer per week    Comment: approx 6 beers a week   Drug use: No   Sexual activity: Not on file

## 2024-09-26 ENCOUNTER — Ambulatory Visit: Admitting: Orthopedic Surgery

## 2024-09-26 DIAGNOSIS — L97924 Non-pressure chronic ulcer of unspecified part of left lower leg with necrosis of bone: Secondary | ICD-10-CM

## 2024-09-27 ENCOUNTER — Encounter: Payer: Self-pay | Admitting: Orthopedic Surgery

## 2024-09-27 NOTE — Progress Notes (Signed)
 Office Visit Note   Patient: Joshua Conner           Date of Birth: September 10, 1971           MRN: [id number] Visit Date: 09/26/2024              Requested by: Okey Carlin Redbird, MD [address] [place],  [place] [address] PCP: Okey Carlin Redbird, MD  Chief Complaint  Patient presents with   Left Leg - Wound Check      HPI: Discussed the use of AI scribe software for clinical note transcription with the patient, who gave verbal consent to proceed.  History of Present Illness He is a 53 year old male who presents for follow-up of a wound on the anterior tibia.  He has been receiving treatment for a wound on his anterior tibia for the past seven months. He wants it to heal completely.  He has been using a specific treatment kit, which is now the last one available. He is hopeful that the wound will be closed by the time the kit is finished. He has been attending weekly follow-up appointments for several months.     Assessment & Plan: Visit Diagnoses:  1. 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 - Applied platelet-derived growth factor. - Follow-up in one week.      Follow-Up Instructions: Return in about 1 week (around 10/03/2024).   Ortho Exam  Patient is alert, oriented, no adenopathy, well-dressed, normal affect, normal respiratory effort. Physical Exam EXTREMITIES: Wound on anterior tibia measuring 1.5x5 cm with 100% healthy granulation tissue. Wound bed was debrided with Vashe.  The wound is flat with healthy granulation tissue without periwound maceration.  Imaging: No results found.   Labs: Lab Results  Component Value Date   HGBA1C 7.0 (H) 03/07/2024   HGBA1C 6.3 (H) 02/15/2024   ESRSEDRATE 6 05/24/2024   ESRSEDRATE 9 03/28/2024   CRP 8.9 (H) 05/24/2024   CRP 9.2  (H) 03/28/2024   CRP 8.5 (H) 02/16/2024   REPTSTATUS 03/15/2024 FINAL 03/10/2024   GRAMSTAIN  03/10/2024    FEW WBC PRESENT, PREDOMINANTLY PMN RARE GRAM NEGATIVE RODS    CULT  03/10/2024    FEW ENTEROBACTER CLOACAE NO ANAEROBES ISOLATED Performed at [facility], [address] [place], [place] [address]    LABORGA ENTEROBACTER CLOACAE 03/10/2024     Lab Results  Component Value Date   ALBUMIN 3.3 (L) 03/08/2024   ALBUMIN 4.0 03/07/2024   ALBUMIN 3.9 02/14/2024    Lab Results  Component Value Date   MG 2.0 03/08/2024   MG 2.1 02/16/2024   MG 2.2 02/15/2024   No results found for: VD25OH  No results found for: PREALBUMIN    Latest Ref Rng & Units 05/24/2024   11:17 AM 03/28/2024    3:05 AM 03/15/2024    3:32 AM  CBC EXTENDED  WBC 3.8 - 10.8 Thousand/uL 6.1  8.9  5.8   RBC 4.20 - 5.80 Million/uL 4.60  4.43  3.94   Hemoglobin 13.2 - 17.1 g/dL 86.9  86.8  88.2   HCT 38.5 - 50.0 % 40.4  39.5  35.6   Platelets 140 - 400 Thousand/uL 355  359  314   NEUT# 1,500 - 7,800 cells/uL 4,172  6,924  There is no height or weight on file to calculate BMI.  Orders:  No orders of the defined types were placed in this encounter.  No orders of the defined types were placed in this encounter.    Procedures: No procedures performed  Clinical Data: No additional findings.  ROS:  All other systems negative, except as noted in the HPI. Review of Systems  Objective: Vital Signs: There were no vitals taken for this visit.  Specialty Comments:  No specialty comments available.  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) 03/10/2024   History of seizure disorder 03/08/2024   CKD stage 3a, GFR 45-59 ml/min (HCC) 03/08/2024   Sepsis (HCC) 03/08/2024   Obesity, Class III, BMI 40-49.9 (morbid obesity) (HCC) 03/08/2024   OSA (obstructive sleep  apnea) 03/08/2024   Cellulitis 03/07/2024   Controlled type 2 diabetes mellitus without complication, without long-term current use of insulin  (HCC) 02/18/2024   Hyperlipidemia 02/18/2024   Cellulitis of left lower extremity 02/14/2024   Aftercare following surgery of the circulatory system 08/28/2014   Varicose veins of leg with complications 08/07/2014   Varicose veins of bilateral lower extremities with other complications 05/01/2014   Past Medical History:  Diagnosis Date   Acid reflux    Diabetes mellitus without complication (HCC)    Seizures (HCC)    Varicose veins     Family History  Problem Relation Age of Onset   Hypertension Mother    COPD Mother    Cancer Mother 75       breast   Hypertension Father    Hypertension Sister     Past Surgical History:  Procedure Laterality Date   APPLICATION OF WOUND VAC Left 03/10/2024   Procedure: APPLICATION, WOUND VAC;  Surgeon: Harden Jerona GAILS, MD;  Location: [facility] OR;  Service: Orthopedics;  Laterality: Left;   HERNIA REPAIR     INCISION AND DRAINAGE OF DEEP ABSCESS, CALF Left 03/10/2024   Procedure: LEFT CALF DEBRIDEMENT;  Surgeon: Harden Jerona GAILS, MD;  Location: [facility] OR;  Service: Orthopedics;  Laterality: Left;  LEFT LEG DEBRIDEMENT   INCISION AND DRAINAGE OF DEEP ABSCESS, CALF Left 03/15/2024   Procedure: INCISION AND DRAINAGE OF DEEP ABSCESS, CALF;  Surgeon: Harden Jerona GAILS, MD;  Location: [facility] OR;  Service: Orthopedics;  Laterality: Left;  DEBRIDEMENT LEFT LEG   INGUINAL HERNIA REPAIR Left 02/04/2015   Procedure: LAPAROSCOPIC LEFT  INGUINAL HERNIA REPAIR WITH MESH;  Surgeon: Lynda Leos, MD;  Location: [facility] ORS;  Service: General;  Laterality: Left;   INSERTION OF MESH Left 02/04/2015   Procedure: INSERTION OF MESH;  Surgeon: Lynda Leos, MD;  Location: [facility];  Service: General;  Laterality: Left;   KNEE SURGERY     VARICOSE VEIN SURGERY     Social History   Occupational History   Not on file  Tobacco Use   Smoking status:  Never   Smokeless tobacco: Not on file  Substance and Sexual Activity   Alcohol use: Yes    Alcohol/week: 12.0 standard drinks of alcohol    Types: 12 Cans of beer per week    Comment: approx 6 beers a week   Drug use: No   Sexual activity: Not on file

## 2024-10-03 ENCOUNTER — Ambulatory Visit: Admitting: Orthopedic Surgery

## 2024-10-05 ENCOUNTER — Encounter: Payer: Self-pay | Admitting: Orthopedic Surgery

## 2024-10-05 NOTE — Progress Notes (Signed)
 Office Visit Note   Patient: Joshua Conner           Date of Birth: [birth year]           MRN: [id number] Visit Date: 10/03/2024              Requested by: Okey Carlin Redbird, MD [address] [place],  [place] [address] PCP: Okey Carlin Redbird, MD  Chief Complaint  Patient presents with   Left Leg - Wound Check      HPI: Discussed the use of AI scribe software for clinical note transcription with the patient, who gave verbal consent to proceed.  History of Present Illness Joshua Conner is a 53 year old male who presents for follow-up of a leg wound.  He has been undergoing treatment for a persistent leg wound using various biologic grafts and is currently utilizing a medicated compression sock. The wound has not yet healed, and he is eager for the healing process to be complete.  He is using Vashe for cleansing the wound and is compliant with this regimen. He remains active by playing golf a couple of days a week, which does not bother him or affect the wound. He also engages in exercises like using a stationary bike to help with circulation and fluid movement in the leg.     Assessment & Plan: Visit Diagnoses:  1. 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 Healing well with current treatment. - Continue Vage Cleansing. - Continue Vivewear compression sock 24/7. - Elevate leg when sitting. - Engage in stationary biking. - Follow up in four weeks.      Follow-Up Instructions: No follow-ups on file.   Ortho Exam  Patient is alert, oriented, no adenopathy, well-dressed, normal affect, normal respiratory effort. Physical Exam SKIN: Wound bed is flat with healthy granulation tissue. Superficial epithelialization over wound, measuring 4x1 cm.      Imaging: No results  found.   Labs: Lab Results  Component Value Date   HGBA1C 7.0 (H) 03/07/2024   HGBA1C 6.3 (H) 02/15/2024   ESRSEDRATE 6 05/24/2024   ESRSEDRATE 9 03/28/2024   CRP 8.9 (H) 05/24/2024   CRP 9.2 (H) 03/28/2024   CRP 8.5 (H) 02/16/2024   REPTSTATUS 03/15/2024 FINAL 03/10/2024   GRAMSTAIN  03/10/2024    FEW WBC PRESENT, PREDOMINANTLY PMN RARE GRAM NEGATIVE RODS    CULT  03/10/2024    FEW ENTEROBACTER CLOACAE NO ANAEROBES ISOLATED Performed at [facility], [address], [place], [place] [address]    LABORGA ENTEROBACTER CLOACAE 03/10/2024     Lab Results  Component Value Date   ALBUMIN 3.3 (L) 03/08/2024   ALBUMIN 4.0 03/07/2024   ALBUMIN 3.9 02/14/2024    Lab Results  Component Value Date   MG 2.0 03/08/2024   MG 2.1 02/16/2024   MG 2.2 02/15/2024   No results found for: VD25OH  No results found for: PREALBUMIN    Latest Ref Rng & Units 05/24/2024   11:17 AM 03/28/2024    3:05 AM 03/15/2024    3:32 AM  CBC EXTENDED  WBC 3.8 - 10.8 Thousand/uL 6.1  8.9  5.8   RBC 4.20 - 5.80 Million/uL 4.60  4.43  3.94   Hemoglobin 13.2 - 17.1 g/dL 86.9  86.8  88.2   HCT 38.5 -  50.0 % 40.4  39.5  35.6   Platelets 140 - 400 Thousand/uL 355  359  314   NEUT# 1,500 - 7,800 cells/uL 4,172  6,924       There is no height or weight on file to calculate BMI.  Orders:  No orders of the defined types were placed in this encounter.  No orders of the defined types were placed in this encounter.    Procedures: No procedures performed  Clinical Data: No additional findings.  ROS:  All other systems negative, except as noted in the HPI. Review of Systems  Objective: Vital Signs: There were no vitals taken for this visit.  Specialty Comments:  No specialty comments available.  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) 03/10/2024   History of  seizure disorder 03/08/2024   CKD stage 3a, GFR 45-59 ml/min (HCC) 03/08/2024   Sepsis (HCC) 03/08/2024   Obesity, Class III, BMI 40-49.9 (morbid obesity) (HCC) 03/08/2024   OSA (obstructive sleep apnea) 03/08/2024   Cellulitis 03/07/2024   Controlled type 2 diabetes mellitus without complication, without long-term current use of insulin  (HCC) 02/18/2024   Hyperlipidemia 02/18/2024   Cellulitis of left lower extremity 02/14/2024   Aftercare following surgery of the circulatory system 08/28/2014   Varicose veins of leg with complications 08/07/2014   Varicose veins of bilateral lower extremities with other complications 05/01/2014   Past Medical History:  Diagnosis Date   Acid reflux    Diabetes mellitus without complication (HCC)    Seizures (HCC)    Varicose veins     Family History  Problem Relation Age of Onset   Hypertension Mother    COPD Mother    Cancer Mother 28       breast   Hypertension Father    Hypertension Sister     Past Surgical History:  Procedure Laterality Date   APPLICATION OF WOUND VAC Left 03/10/2024   Procedure: APPLICATION, WOUND VAC;  Surgeon: Harden Jerona GAILS, MD;  Location: [facility] OR;  Service: Orthopedics;  Laterality: Left;   HERNIA REPAIR     INCISION AND DRAINAGE OF DEEP ABSCESS, CALF Left 03/10/2024   Procedure: LEFT CALF DEBRIDEMENT;  Surgeon: Harden Jerona GAILS, MD;  Location: [facility] OR;  Service: Orthopedics;  Laterality: Left;  LEFT LEG DEBRIDEMENT   INCISION AND DRAINAGE OF DEEP ABSCESS, CALF Left 03/15/2024   Procedure: INCISION AND DRAINAGE OF DEEP ABSCESS, CALF;  Surgeon: Harden Jerona GAILS, MD;  Location: [facility] OR;  Service: Orthopedics;  Laterality: Left;  DEBRIDEMENT LEFT LEG   INGUINAL HERNIA REPAIR Left 02/04/2015   Procedure: LAPAROSCOPIC LEFT  INGUINAL HERNIA REPAIR WITH MESH;  Surgeon: Lynda Leos, MD;  Location: [facility];  Service: General;  Laterality: Left;   INSERTION OF MESH Left 02/04/2015   Procedure: INSERTION OF MESH;  Surgeon: Lynda Leos, MD;  Location: [facility];  Service: General;  Laterality: Left;   KNEE SURGERY     VARICOSE VEIN SURGERY     Social History   Occupational History   Not on file  Tobacco Use   Smoking status: Never   Smokeless tobacco: Not on file  Substance and Sexual Activity   Alcohol use: Yes    Alcohol/week: 12.0 standard drinks of alcohol    Types: 12 Cans of beer per week    Comment: approx 6 beers a week   Drug use:  No   Sexual activity: Not on file

## 2024-10-30 ENCOUNTER — Encounter: Payer: Self-pay | Admitting: Orthopedic Surgery

## 2024-10-30 ENCOUNTER — Ambulatory Visit: Admitting: Orthopedic Surgery

## 2024-10-30 DIAGNOSIS — L97924 Non-pressure chronic ulcer of unspecified part of left lower leg with necrosis of bone: Secondary | ICD-10-CM

## 2024-10-30 NOTE — Progress Notes (Signed)
 "  Office Visit Note   Patient: Joshua Conner           Date of Birth: 1971/03/13           MRN: [id number] Visit Date: 10/30/2024              Requested by: Okey Carlin Redbird, MD [address] [place],  [place] [address] PCP: Okey Carlin Redbird, MD  Chief Complaint  Patient presents with   Left Leg - Wound Check      HPI: Discussed the use of AI scribe software for clinical note transcription with the patient, who gave verbal consent to proceed.  History of Present Illness  Patient is seen in follow-up for serial amnion chorion tissue grafting for the left leg wound.  Patient has undergone treatment also with Kerecis tissue graft.     Assessment & Plan: Visit Diagnoses:  1. 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  Plan patient will continue with the Vive wear medical compression socks.  At follow-up if there is not continued resolution would plan for application of donated tissue graft.      Follow-Up Instructions: Return in about 4 weeks (around 11/27/2024).   Ortho Exam  Patient is alert, oriented, no adenopathy, well-dressed, normal affect, normal respiratory effort. Physical Exam  On examination the wound bed is flat with healthy granulation tissue there is superficial epithelization over the wound bed that measures 1 x 3 cm.  There is no cellulitis no drainage no signs of infection.      Imaging: No results found.   Labs: Lab Results  Component Value Date   HGBA1C 7.0 (H) 03/07/2024   HGBA1C 6.3 (H) 02/15/2024   ESRSEDRATE 6 05/24/2024   ESRSEDRATE 9 03/28/2024   CRP 8.9 (H) 05/24/2024   CRP 9.2 (H) 03/28/2024   CRP 8.5 (H) 02/16/2024   REPTSTATUS 03/15/2024 FINAL 03/10/2024   GRAMSTAIN  03/10/2024    FEW WBC PRESENT, PREDOMINANTLY PMN RARE GRAM NEGATIVE RODS    CULT  03/10/2024     FEW ENTEROBACTER CLOACAE NO ANAEROBES ISOLATED Performed at [facility], [address] [place], [place] [address]    LABORGA ENTEROBACTER CLOACAE 03/10/2024     Lab Results  Component Value Date   ALBUMIN 3.3 (L) 03/08/2024   ALBUMIN 4.0 03/07/2024   ALBUMIN 3.9 02/14/2024    Lab Results  Component Value Date   MG 2.0 03/08/2024   MG 2.1 02/16/2024   MG 2.2 02/15/2024   No results found for: VD25OH  No results found for: PREALBUMIN    Latest Ref Rng & Units 05/24/2024   11:17 AM 03/28/2024    3:05 AM 03/15/2024    3:32 AM  CBC EXTENDED  WBC 3.8 - 10.8 Thousand/uL 6.1  8.9  5.8   RBC 4.20 - 5.80 Million/uL 4.60  4.43  3.94   Hemoglobin 13.2 - 17.1 g/dL 86.9  86.8  88.2   HCT 38.5 - 50.0 % 40.4  39.5  35.6   Platelets 140 - 400 Thousand/uL 355  359  314   NEUT# 1,500 - 7,800 cells/uL 4,172  6,924       There is no height or weight on file to calculate BMI.  Orders:  No orders of the defined types were placed in this encounter.  No orders of the defined  types were placed in this encounter.    Procedures: No procedures performed  Clinical Data: No additional findings.  ROS:  All other systems negative, except as noted in the HPI. Review of Systems  Objective: Vital Signs: There were no vitals taken for this visit.  Specialty Comments:  No specialty comments available.  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) 03/10/2024   History of seizure disorder 03/08/2024   CKD stage 3a, GFR 45-59 ml/min (HCC) 03/08/2024   Sepsis (HCC) 03/08/2024   Obesity, Class III, BMI 40-49.9 (morbid obesity) (HCC) 03/08/2024   OSA (obstructive sleep apnea) 03/08/2024   Cellulitis 03/07/2024   Controlled type 2 diabetes mellitus without complication, without long-term current use of insulin  (HCC) 02/18/2024   Hyperlipidemia 02/18/2024    Cellulitis of left lower extremity 02/14/2024   Aftercare following surgery of the circulatory system 08/28/2014   Varicose veins of leg with complications 08/07/2014   Varicose veins of bilateral lower extremities with other complications 05/01/2014   Past Medical History:  Diagnosis Date   Acid reflux    Diabetes mellitus without complication (HCC)    Seizures (HCC)    Varicose veins     Family History  Problem Relation Age of Onset   Hypertension Mother    COPD Mother    Cancer Mother 65       breast   Hypertension Father    Hypertension Sister     Past Surgical History:  Procedure Laterality Date   APPLICATION OF WOUND VAC Left 03/10/2024   Procedure: APPLICATION, WOUND VAC;  Surgeon: Harden Jerona GAILS, MD;  Location: [facility] OR;  Service: Orthopedics;  Laterality: Left;   HERNIA REPAIR     INCISION AND DRAINAGE OF DEEP ABSCESS, CALF Left 03/10/2024   Procedure: LEFT CALF DEBRIDEMENT;  Surgeon: Harden Jerona GAILS, MD;  Location: [facility] OR;  Service: Orthopedics;  Laterality: Left;  LEFT LEG DEBRIDEMENT   INCISION AND DRAINAGE OF DEEP ABSCESS, CALF Left 03/15/2024   Procedure: INCISION AND DRAINAGE OF DEEP ABSCESS, CALF;  Surgeon: Harden Jerona GAILS, MD;  Location: [facility] OR;  Service: Orthopedics;  Laterality: Left;  DEBRIDEMENT LEFT LEG   INGUINAL HERNIA REPAIR Left 02/04/2015   Procedure: LAPAROSCOPIC LEFT  INGUINAL HERNIA REPAIR WITH MESH;  Surgeon: Lynda Leos, MD;  Location: [facility];  Service: General;  Laterality: Left;   INSERTION OF MESH Left 02/04/2015   Procedure: INSERTION OF MESH;  Surgeon: Lynda Leos, MD;  Location: [facility];  Service: General;  Laterality: Left;   KNEE SURGERY     VARICOSE VEIN SURGERY     Social History   Occupational History   Not on file  Tobacco Use   Smoking status: Never   Smokeless tobacco: Not on file  Substance and Sexual Activity   Alcohol use: Yes    Alcohol/week: 12.0 standard drinks of alcohol    Types: 12 Cans of beer per week    Comment:  approx 6 beers a week   Drug use: No   Sexual activity: Not on file         "

## 2024-11-13 ENCOUNTER — Ambulatory Visit: Admitting: Orthopedic Surgery

## 2024-11-13 ENCOUNTER — Encounter: Payer: Self-pay | Admitting: Orthopedic Surgery

## 2024-11-13 DIAGNOSIS — L03116 Cellulitis of left lower limb: Secondary | ICD-10-CM | POA: Diagnosis not present

## 2024-11-13 DIAGNOSIS — L97924 Non-pressure chronic ulcer of unspecified part of left lower leg with necrosis of bone: Secondary | ICD-10-CM | POA: Diagnosis not present

## 2024-11-13 NOTE — Progress Notes (Signed)
 "  Office Visit Note   Patient: Joshua Conner           Date of Birth: 21-May-1971           MRN: [id number] Visit Date: 11/13/2024              Requested by: Okey Carlin Redbird, MD [address] [place],  [place] [address] PCP: Okey Carlin Redbird, MD  Chief Complaint  Patient presents with   Left Leg - Wound Check      HPI: Discussed the use of AI scribe software for clinical note transcription with the patient, who gave verbal consent to proceed.  History of Present Illness Joshua Conner is a 54 year old male with a chronic venous ulcer of the left lower extremity who presents for follow-up evaluation of wound healing.  He has a venous ulcer on the left lower extremity that has been present for approximately nine months. There was a delay in initiation of appropriate wound care for about one month prior to establishing care with the current provider. Since then, he has received extensive wound care, including use of a Vito pulse compression sock.  He reports persistent and intense pruritus at the wound site. He notes formation of a thin layer of new skin and significant new epithelialization.  He denies symptoms of infection.     Assessment & Plan: Visit Diagnoses:  1. 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 Cellulitis of left lower extremity     Plan: Assessment and Plan Assessment & Plan Venous ulcer of left lower extremity Chronic venous ulcer healing rapidly with new epithelialization and no infection. - Continued Vito pulse compression sock therapy. - Scheduled follow-up in two months.      Follow-Up Instructions: Return in about 2 months (around 01/11/2025).   Ortho Exam  Patient is alert, oriented, no adenopathy, well-dressed, normal affect, normal respiratory effort. Physical Exam SKIN: Venous ulcer on left leg with rapid healing, measuring 3.5 x 0.5 cm with superficial epithelialization. No cellulitis, odor, or drainage.       Imaging: No results found.   Labs: Lab Results  Component Value Date   HGBA1C 7.0 (H) 03/07/2024   HGBA1C 6.3 (H) 02/15/2024   ESRSEDRATE 6 05/24/2024   ESRSEDRATE 9 03/28/2024   CRP 8.9 (H) 05/24/2024   CRP 9.2 (H) 03/28/2024   CRP 8.5 (H) 02/16/2024   REPTSTATUS 03/15/2024 FINAL 03/10/2024   GRAMSTAIN  03/10/2024    FEW WBC PRESENT, PREDOMINANTLY PMN RARE GRAM NEGATIVE RODS    CULT  03/10/2024    FEW ENTEROBACTER CLOACAE NO ANAEROBES ISOLATED Performed at [facility], [address] [place], [place] [address]    LABORGA ENTEROBACTER CLOACAE 03/10/2024     Lab Results  Component Value Date   ALBUMIN 3.3 (L) 03/08/2024   ALBUMIN 4.0 03/07/2024   ALBUMIN 3.9 02/14/2024    Lab Results  Component Value Date   MG 2.0 03/08/2024   MG 2.1 02/16/2024   MG 2.2 02/15/2024   No results found for: VD25OH  No results found for: PREALBUMIN    Latest Ref Rng & Units 05/24/2024   11:17 AM 03/28/2024    3:05 AM 03/15/2024    3:32 AM  CBC EXTENDED  WBC 3.8 - 10.8 Thousand/uL 6.1  8.9  5.8   RBC 4.20 - 5.80 Million/uL 4.60  4.43  3.94   Hemoglobin 13.2 - 17.1 g/dL 86.9  86.8  11.7   HCT 38.5 - 50.0 % 40.4  39.5  35.6   Platelets 140 - 400 Thousand/uL 355  359  314   NEUT# 1,500 - 7,800 cells/uL 4,172  6,924       There is no height or weight on file to calculate BMI.  Orders:  No orders of the defined types were placed in this encounter.  No orders of the defined types were placed in this encounter.    Procedures: No procedures performed  Clinical Data: No additional findings.  ROS:  All other systems negative, except as noted in the HPI. Review of Systems  Objective: Vital Signs: There were no vitals taken for this visit.  Specialty Comments:  No specialty comments available.  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)  03/10/2024   History of seizure disorder 03/08/2024   CKD stage 3a, GFR 45-59 ml/min (HCC) 03/08/2024   Sepsis (HCC) 03/08/2024   Obesity, Class III, BMI 40-49.9 (morbid obesity) (HCC) 03/08/2024   OSA (obstructive sleep apnea) 03/08/2024   Cellulitis 03/07/2024   Controlled type 2 diabetes mellitus without complication, without long-term current use of insulin  (HCC) 02/18/2024   Hyperlipidemia 02/18/2024   Cellulitis of left lower extremity 02/14/2024   Aftercare following surgery of the circulatory system 08/28/2014   Varicose veins of leg with complications 08/07/2014   Varicose veins of bilateral lower extremities with other complications 05/01/2014   Past Medical History:  Diagnosis Date   Acid reflux    Diabetes mellitus without complication (HCC)    Seizures (HCC)    Varicose veins     Family History  Problem Relation Age of Onset   Hypertension Mother    COPD Mother    Cancer Mother 65       breast   Hypertension Father    Hypertension Sister     Past Surgical History:  Procedure Laterality Date   APPLICATION OF WOUND VAC Left 03/10/2024   Procedure: APPLICATION, WOUND VAC;  Surgeon: Harden Jerona GAILS, MD;  Location: [facility] OR;  Service: Orthopedics;  Laterality: Left;   HERNIA REPAIR     INCISION AND DRAINAGE OF DEEP ABSCESS, CALF Left 03/10/2024   Procedure: LEFT CALF DEBRIDEMENT;  Surgeon: Harden Jerona GAILS, MD;  Location: [facility] OR;  Service: Orthopedics;  Laterality: Left;  LEFT LEG DEBRIDEMENT   INCISION AND DRAINAGE OF DEEP ABSCESS, CALF Left 03/15/2024   Procedure: INCISION AND DRAINAGE OF DEEP ABSCESS, CALF;  Surgeon: Harden Jerona GAILS, MD;  Location: [facility] OR;  Service: Orthopedics;  Laterality: Left;  DEBRIDEMENT LEFT LEG   INGUINAL HERNIA REPAIR Left 02/04/2015   Procedure: LAPAROSCOPIC LEFT  INGUINAL HERNIA REPAIR WITH MESH;  Surgeon: Lynda Leos, MD;  Location: [facility] ORS;  Service: General;  Laterality: Left;   INSERTION OF MESH Left 02/04/2015   Procedure: INSERTION OF MESH;   Surgeon: Lynda Leos, MD;  Location: [facility];  Service: General;  Laterality: Left;   KNEE SURGERY     VARICOSE VEIN SURGERY     Social History   Occupational History   Not on file  Tobacco Use   Smoking status: Never   Smokeless tobacco: Not on file  Substance and Sexual Activity   Alcohol use: Yes    Alcohol/week: 12.0 standard drinks of alcohol    Types: 12 Cans of beer per week    Comment: approx 6 beers  a week   Drug use: No   Sexual activity: Not on file         "

## 2025-01-11 ENCOUNTER — Ambulatory Visit: Admitting: Orthopedic Surgery
